# Patient Record
Sex: Female | Born: 1940
Health system: Southern US, Community
[De-identification: ages and names within clinical notes are randomized; demographics above are authoritative.]

## PROBLEM LIST (undated history)

## (undated) ENCOUNTER — Ambulatory Visit: Payer: Medicare PPO

## (undated) DIAGNOSIS — E66811 Obesity, class 1: Secondary | ICD-10-CM

## (undated) DIAGNOSIS — Z860100 Personal history of colon polyps, unspecified: Secondary | ICD-10-CM

## (undated) DIAGNOSIS — T4145XA Adverse effect of unspecified anesthetic, initial encounter: Secondary | ICD-10-CM

## (undated) DIAGNOSIS — I739 Peripheral vascular disease, unspecified: Secondary | ICD-10-CM

## (undated) DIAGNOSIS — R112 Nausea with vomiting, unspecified: Secondary | ICD-10-CM

## (undated) DIAGNOSIS — J189 Pneumonia, unspecified organism: Secondary | ICD-10-CM

## (undated) DIAGNOSIS — M1712 Unilateral primary osteoarthritis, left knee: Secondary | ICD-10-CM

## (undated) DIAGNOSIS — K219 Gastro-esophageal reflux disease without esophagitis: Secondary | ICD-10-CM

## (undated) DIAGNOSIS — K5900 Constipation, unspecified: Secondary | ICD-10-CM

## (undated) DIAGNOSIS — M2550 Pain in unspecified joint: Secondary | ICD-10-CM

## (undated) DIAGNOSIS — M549 Dorsalgia, unspecified: Secondary | ICD-10-CM

## (undated) DIAGNOSIS — E785 Hyperlipidemia, unspecified: Secondary | ICD-10-CM

## (undated) DIAGNOSIS — T7840XA Allergy, unspecified, initial encounter: Secondary | ICD-10-CM

## (undated) DIAGNOSIS — Z9889 Other specified postprocedural states: Secondary | ICD-10-CM

## (undated) DIAGNOSIS — I509 Heart failure, unspecified: Secondary | ICD-10-CM

## (undated) DIAGNOSIS — C801 Malignant (primary) neoplasm, unspecified: Secondary | ICD-10-CM

## (undated) DIAGNOSIS — G5600 Carpal tunnel syndrome, unspecified upper limb: Secondary | ICD-10-CM

## (undated) DIAGNOSIS — E039 Hypothyroidism, unspecified: Secondary | ICD-10-CM

## (undated) DIAGNOSIS — I10 Essential (primary) hypertension: Secondary | ICD-10-CM

## (undated) DIAGNOSIS — R42 Dizziness and giddiness: Secondary | ICD-10-CM

## (undated) DIAGNOSIS — H269 Unspecified cataract: Secondary | ICD-10-CM

## (undated) DIAGNOSIS — H04123 Dry eye syndrome of bilateral lacrimal glands: Secondary | ICD-10-CM

## (undated) DIAGNOSIS — M1711 Unilateral primary osteoarthritis, right knee: Secondary | ICD-10-CM

## (undated) DIAGNOSIS — E2839 Other primary ovarian failure: Secondary | ICD-10-CM

## (undated) DIAGNOSIS — R350 Frequency of micturition: Secondary | ICD-10-CM

## (undated) DIAGNOSIS — E669 Obesity, unspecified: Secondary | ICD-10-CM

## (undated) DIAGNOSIS — Z5189 Encounter for other specified aftercare: Secondary | ICD-10-CM

## (undated) DIAGNOSIS — D649 Anemia, unspecified: Secondary | ICD-10-CM

## (undated) DIAGNOSIS — T8859XA Other complications of anesthesia, initial encounter: Secondary | ICD-10-CM

## (undated) DIAGNOSIS — J45909 Unspecified asthma, uncomplicated: Secondary | ICD-10-CM

## (undated) DIAGNOSIS — R3915 Urgency of urination: Secondary | ICD-10-CM

## (undated) HISTORY — PX: JOINT REPLACEMENT: SHX530

## (undated) HISTORY — DX: Obesity, unspecified: E66.9

## (undated) HISTORY — DX: Essential (primary) hypertension: I10

## (undated) HISTORY — DX: Other primary ovarian failure: E28.39

## (undated) HISTORY — DX: Anemia, unspecified: D64.9

## (undated) HISTORY — DX: Peripheral vascular disease, unspecified: I73.9

## (undated) HISTORY — PX: ABDOMINAL HYSTERECTOMY: SHX81

## (undated) HISTORY — DX: Allergy, unspecified, initial encounter: T78.40XA

## (undated) HISTORY — PX: BACK SURGERY: SHX140

## (undated) HISTORY — PX: TUBAL LIGATION: SHX77

## (undated) HISTORY — DX: Obesity, class 1: E66.811

## (undated) HISTORY — PX: APPENDECTOMY: SHX54

## (undated) HISTORY — PX: SPINE SURGERY: SHX786

## (undated) HISTORY — DX: Gastro-esophageal reflux disease without esophagitis: K21.9

## (undated) HISTORY — DX: Carpal tunnel syndrome, unspecified upper limb: G56.00

## (undated) HISTORY — DX: Hypothyroidism, unspecified: E03.9

---

## 1981-07-20 HISTORY — PX: NECK SURGERY: SHX720

## 2001-08-30 ENCOUNTER — Ambulatory Visit (HOSPITAL_COMMUNITY): Admission: RE | Admit: 2001-08-30 | Discharge: 2001-08-30 | Payer: Self-pay | Admitting: Neurology

## 2001-08-30 ENCOUNTER — Encounter: Payer: Self-pay | Admitting: Neurology

## 2002-07-04 ENCOUNTER — Ambulatory Visit (HOSPITAL_COMMUNITY): Admission: RE | Admit: 2002-07-04 | Discharge: 2002-07-04 | Payer: Self-pay | Admitting: Internal Medicine

## 2002-07-04 ENCOUNTER — Encounter: Payer: Self-pay | Admitting: Internal Medicine

## 2002-09-14 ENCOUNTER — Encounter (HOSPITAL_COMMUNITY): Admission: RE | Admit: 2002-09-14 | Discharge: 2002-10-14 | Payer: Self-pay | Admitting: Rheumatology

## 2002-12-07 ENCOUNTER — Encounter: Payer: Self-pay | Admitting: Obstetrics and Gynecology

## 2002-12-07 ENCOUNTER — Ambulatory Visit (HOSPITAL_COMMUNITY): Admission: RE | Admit: 2002-12-07 | Discharge: 2002-12-07 | Payer: Self-pay | Admitting: Obstetrics and Gynecology

## 2003-06-07 ENCOUNTER — Ambulatory Visit (HOSPITAL_COMMUNITY): Admission: RE | Admit: 2003-06-07 | Discharge: 2003-06-07 | Payer: Self-pay | Admitting: Family Medicine

## 2003-08-07 ENCOUNTER — Ambulatory Visit (HOSPITAL_COMMUNITY): Admission: RE | Admit: 2003-08-07 | Discharge: 2003-08-07 | Payer: Self-pay | Admitting: Internal Medicine

## 2003-11-21 ENCOUNTER — Other Ambulatory Visit: Admission: RE | Admit: 2003-11-21 | Discharge: 2003-11-21 | Payer: Self-pay | Admitting: Dermatology

## 2004-01-03 ENCOUNTER — Ambulatory Visit (HOSPITAL_COMMUNITY): Admission: RE | Admit: 2004-01-03 | Discharge: 2004-01-03 | Payer: Self-pay | Admitting: Obstetrics and Gynecology

## 2004-05-02 ENCOUNTER — Ambulatory Visit (HOSPITAL_COMMUNITY): Admission: RE | Admit: 2004-05-02 | Discharge: 2004-05-02 | Payer: Self-pay | Admitting: Family Medicine

## 2004-09-09 ENCOUNTER — Ambulatory Visit (HOSPITAL_COMMUNITY): Admission: RE | Admit: 2004-09-09 | Discharge: 2004-09-09 | Payer: Self-pay | Admitting: Internal Medicine

## 2004-09-09 ENCOUNTER — Ambulatory Visit: Payer: Self-pay | Admitting: Internal Medicine

## 2004-09-09 HISTORY — PX: COLONOSCOPY: SHX174

## 2005-07-27 ENCOUNTER — Ambulatory Visit (HOSPITAL_COMMUNITY): Admission: RE | Admit: 2005-07-27 | Discharge: 2005-07-27 | Payer: Self-pay | Admitting: Internal Medicine

## 2006-05-05 ENCOUNTER — Encounter (HOSPITAL_COMMUNITY): Admission: RE | Admit: 2006-05-05 | Discharge: 2006-06-04 | Payer: Self-pay | Admitting: Family Medicine

## 2006-05-07 ENCOUNTER — Ambulatory Visit (HOSPITAL_COMMUNITY): Admission: RE | Admit: 2006-05-07 | Discharge: 2006-05-07 | Payer: Self-pay | Admitting: Family Medicine

## 2006-08-12 ENCOUNTER — Ambulatory Visit (HOSPITAL_COMMUNITY): Admission: RE | Admit: 2006-08-12 | Discharge: 2006-08-12 | Payer: Self-pay | Admitting: Family Medicine

## 2006-11-05 ENCOUNTER — Ambulatory Visit (HOSPITAL_COMMUNITY): Admission: RE | Admit: 2006-11-05 | Discharge: 2006-11-05 | Payer: Self-pay | Admitting: Orthopaedic Surgery

## 2007-08-26 ENCOUNTER — Ambulatory Visit (HOSPITAL_COMMUNITY): Admission: RE | Admit: 2007-08-26 | Discharge: 2007-08-26 | Payer: Self-pay | Admitting: Family Medicine

## 2007-10-19 HISTORY — PX: RECTOCELE REPAIR: SHX761

## 2007-11-14 ENCOUNTER — Ambulatory Visit (HOSPITAL_COMMUNITY): Admission: RE | Admit: 2007-11-14 | Discharge: 2007-11-15 | Payer: Self-pay | Admitting: Obstetrics and Gynecology

## 2007-11-14 ENCOUNTER — Encounter: Payer: Self-pay | Admitting: Obstetrics and Gynecology

## 2008-09-03 ENCOUNTER — Ambulatory Visit (HOSPITAL_COMMUNITY): Admission: RE | Admit: 2008-09-03 | Discharge: 2008-09-03 | Payer: Self-pay | Admitting: Family Medicine

## 2009-09-24 ENCOUNTER — Ambulatory Visit (HOSPITAL_COMMUNITY): Admission: RE | Admit: 2009-09-24 | Discharge: 2009-09-24 | Payer: Self-pay | Admitting: Family Medicine

## 2010-08-10 ENCOUNTER — Encounter: Payer: Self-pay | Admitting: Family Medicine

## 2010-10-24 ENCOUNTER — Other Ambulatory Visit (HOSPITAL_COMMUNITY): Payer: Self-pay | Admitting: Internal Medicine

## 2010-10-24 DIAGNOSIS — Z139 Encounter for screening, unspecified: Secondary | ICD-10-CM

## 2010-10-30 ENCOUNTER — Ambulatory Visit (HOSPITAL_COMMUNITY)
Admission: RE | Admit: 2010-10-30 | Discharge: 2010-10-30 | Disposition: A | Discharge status: Home / Self Care | Payer: Medicare Other | Source: Ambulatory Visit | Attending: Internal Medicine | Admitting: Internal Medicine

## 2010-10-30 DIAGNOSIS — Z139 Encounter for screening, unspecified: Secondary | ICD-10-CM

## 2010-10-30 DIAGNOSIS — Z1231 Encounter for screening mammogram for malignant neoplasm of breast: Secondary | ICD-10-CM | POA: Insufficient documentation

## 2010-12-02 NOTE — Op Note (Signed)
NAME:  [name], [name]                 ACCOUNT NO.:  [id number]   MEDICAL RECORD NO.:  [id number]          PATIENT TYPE:  OIB   LOCATION:  A327                          FACILITY:  [facility]   PHYSICIAN:  Tilda Burrow, M.D. DATE OF BIRTH:  October 25, 1940   DATE OF PROCEDURE:  11/14/2007  DATE OF DISCHARGE:                               OPERATIVE REPORT   PREOPERATIVE DIAGNOSIS:  Rectocele.   POSTOPERATIVE DIAGNOSIS:  Rectocele.   PROCEDURE:  Posterior vaginal repair.   SURGEON:  Tilda Burrow, MD   ASSISTANT:  Marlana Salvage, CRNA.   ANESTHESIA:  General.   COMPLICATIONS:  None.   FINDINGS:  Marked posterior laxity with extremely mobile rectal tissues,  midline perineal defect.   DETAILS OF PROCEDURE:  The patient was taken to the operating room and  prepped and draped for vaginal procedure.  A moistened vaginal tape was  placed in the rectum for orientation.  The very lax mobile perineal  tissues of the posterior vaginal wall were grasped with Allis clamp at 4  and 8 o'clock with Allis clamps at the level of the hymen remnants on  the posterior fourchette, and a third Allis clamp was placed  approximately 6 cm of the posterior vaginal wall, halfway to the vaginal  apex.  Vaginal apex support was adequate.  The posterior vaginal mucosa  was opened at the level of the hymen remnants, elevated, and easily  elevated off the underlying connective tissues.  It was split in the  midline up to the upper Allis clamp and then easily dissected laterally  and cephalad mobilizing loose tissues failed to represent either a very  large rectocele or enterocele.  At this time, a double-gloved index  finger preplaced in the rectum and confirming that this was all rectal  tissues, and there was no identifiable enterocele.  The procedure then  continued with while I maintained the right index finger in the rectum.  Allis clamps were used to grasp pararectal tissue on either side that  could be pulled into  the midline and elevated cephalad producing a very  nice rectal shelf.  A series of interrupted 0 Vicryl sutures were then  used to pull the two-sided midline defect together producing a nice  layer of tissue support with a total of 5 sutures placed.  Then, the  hand was removed, gloves changed, and sutures tied down.  Perineal body  was built up with an additional 3 sutures, and then the vaginal mucosa  trimmed.  The remaining mucosal edges were reapproximated using 2-0  chromic sutures, and then vaginal packing was placed in the vagina,  having been soaked in Betadine prior to placement.  The rectal gauze was  removed.  A Foley catheter had been placed in the bladder emptying clear  urine.  Sponge and needle counts were correct throughout the procedure.  The patient tolerated the procedure well and went to recovery room in  good condition.      Tilda Burrow, M.D.  Electronically Signed     JVF/MEDQ  D:  11/14/2007  T:  11/15/2007  Job:  [id number]

## 2010-12-05 NOTE — Consult Note (Signed)
NAME:  [name], [name]                           ACCOUNT NO.:  [id number]   MEDICAL RECORD NO.:  [id number]                   PATIENT TYPE:  REC   LOCATION:  SPCL                                 FACILITY:  [facility]   PHYSICIAN:  Aundra Dubin, M.D.            DATE OF BIRTH:  07-15-1941   DATE OF CONSULTATION:  09/14/2002  DATE OF DISCHARGE:                                   CONSULTATION   CHIEF COMPLAINT:  Referred.   Dear [name],   Thank you for this consultation.   HISTORY:  This patient is a 70 year old [name] female with hypothyroidism and  a history of a positive ANA for about 1 year.  The titer is 1:320 without a  reported pattern.  Other tests on 07/04/2002 showed an albumin 4.6, calcium  9.3, AST 21, glucose 90, creatinine 0.9, WBC 8.7, Hgb 15.2, TLT 286.   In terms of arthralgias, she stated I ache all over.  She is quite stiff  in the mornings, particularly all through the back and up into the shoulder  areas.  There is possible jelling.  There has been no swollen joints.  She  did have an injury or swelling to the let knee about 1 year ago when Dr.  Renae Fickle injected this with good results.  She has been on Celebrex since this  time which she takes 2-3 tablets per week.  Her thumbs ache some, but she  does not feel that they have been swollen.   REVIEW OF SYSTEMS:  On further review of systems she denies fevers or weight  loss.  She occasionally has some blisters to her fingers.  She has some  redness to her face which can worsen around heat or being in the sun.  There  has been no oral ulcers, alopecia, pleurisy or Raynaud.  She occasionally  has headaches which will last a day and Tylenol helps.  This occurs only 3-4  times a year.  Her energy level was somewhat down.  She says that she  generally sleeps well, but has a few nights of nonrestorative sleep.  She  denies diarrhea, constipation, blood or mucus.  She has been more short of  breath or winded with slight  activity recently.  The back pain, described  above, is nonradiating.   PAST MEDICAL/SURGICAL HISTORY:  1. Hyperthyroidism.  2. Appendectomy in 1980.  3. Cervical spine discectomy in 1983.  4. Hysterectomy and oophorectomy in 1990.   MEDICATIONS:  1. Synthroid 0.5 mg daily.  2. Estrogen 0.625 mg daily.  3. Celebrex 200 mg p.r.n.   ALLERGIES:  Drug Intolerance:  Penicillin.   FAMILY HISTORY:  Her father died at age 59 with emphysema and pneumonia.  She has a brother with emphysema and another brother who has died from lung  cancer.  Her mother died at age 41 and she had Alzheimer's.   SOCIAL HISTORY:  She is  married and has two sons.  One son still lives at  home. 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  She is a [place] native.  She never smoked and does not drink alcohol.   PHYSICAL EXAMINATION:  VITAL SIGNS:  Weight 170 pounds.  Height 5 feet 3  inches.  Blood pressure 146/90, respirations 16.  GENERAL:  She is mild-to-moderately overweight and is in no distress.  SKIN:  There is no malar rash or blisters of the fingers.  There is no nail  fold dilatation or scleral derma changes.  HEENT:  Clear sclerae.  PERL/EOMI.  Mouth: Dentures, no obvious ulcers or  petechiae.  NECK:  Prominent submandibular glands, palpable mildly enlarged thyroid,  nontender  LUNGS:  Clear.  HEART:  Regular with no murmurs.  ABDOMEN:  Negative.  HSM nontender.  MUSCULOSKELETAL:  The small joints of the fingers show no swelling and are  nontender.  There is slight squaring to the first CMCs but these areas are  nontender.  Wrists, elbows and shoulders: Good range of motion, but slightly  limited and nontender.  Trigger points at the elbows, shoulders, neck,  occiput, anterior chest, upper paraspinous muscles and across the low back:  They were all nontender. Hips:  Good range of motion.  The knees have slight  hypertrophy, were cool, no effusions, and were nontender  with flexion at 135  degrees bilaterally.  There was no joint line tenderness.  The ankles and  feet showed no edema, and were nontender.  NEUROLOGIC:  Strength is 5/5, DTRs are 1+ throughout and negative SLR.   ASSESSMENT AND PLAN:  1. Positive ANA.  I do not believe that she has lupus.  I suspect that the     positive ANA is related to her thyroid condition.  Particularly with     Hashimoto's thyroiditis an ANA is more frequently positive.  Especially     now, being almost 70 years of age, the likelihood of this being lupus is     quite remote.  2. Polyarthralgia.  She has some mild degenerative changes.  She tells me     from the x-rays with Dr. Renae Fickle, the exam overall was quite good. I found     that although her strength is 5/5 there is some general deconditioning     and atrophy to the thigh muscles.  I have spent considerable time     encouraging her to start an exercise program be it walking or stationary     bike.  She could certainly do a combination of these.  She will start     with a small amount at 10 minutes 3-4 times a week and add 2-3 minutes of     exercise per week.  It will take her approximately 4-6 weeks to achieve     exercise in 30-40 minutes three times a week.  I believe that this will     help her back and knees particularly.  Concerning medicines,  I have     discussed that she could use Tylenol 1 gm b.i.d. to t.i.d. p.r.n. in     addition to using the Celebrex as above.  3. Hypothyroidism.  4. Hysterectomy.  5. History of cervical spine surgery.   [name], as above the ANA is nonspecific and I do not feel that she has lupus.  It may be related to her hypothyroid condition. She has some generalized  ache and pain. She did state  that she ached all over, but her sleep pattern  seems  to be reasonable, and her trigger points were negative.  I hope that she will begin exercising, as I think, that this will help her energy level and  overall well being.  If these  problems continue, I will be glad to  reevaluate here.  She will return on a p.r.n. basis.                                               Aundra Dubin, M.D.    WWT/MEDQ  D:  09/14/2002  T:  09/14/2002  Job:  [id number]   cc:   Madelin Rear. Sherwood Gambler, M.D.  [address]  [place]  [place] [address]  Fax: [phone]

## 2010-12-05 NOTE — Op Note (Signed)
NAME:  [name], [name]                 ACCOUNT NO.:  [id number]   MEDICAL RECORD NO.:  [id number]          PATIENT TYPE:  AMB   LOCATION:  DAY                           FACILITY:  [facility]   PHYSICIAN:  R. Roetta Sessions, M.D. DATE OF BIRTH:  January 17, 1941   DATE OF PROCEDURE:  09/09/2004  DATE OF DISCHARGE:                                 OPERATIVE REPORT   PROCEDURE:  Screening colonoscopy.   INDICATION FOR PROCEDURE:  The patient is a 70 year old lady sent over  courtesy of [employer]. Sherwood Gambler, M.D., for a screening colonoscopy.  She is  devoid of any lower GI tract symptoms.  No family history of colorectal  neoplasia.  She has never had a colonoscopy; however, she has had a  significant some 10 years ago without significant findings.  Colonoscopy is  now being done as a standard screening maneuver.  This approach has been  discussed with the patient at length, the potential risks, benefits, and  alternatives have been reviewed, questions answered.  She is agreeable.  Please see documentation in the medical record.   PROCEDURE NOTE:  O2 saturation, blood pressure, pulse, and respiration were  monitored throughout the entire procedure.   CONSCIOUS SEDATION:  IV Demerol and Versed in incremental doses, Zofran 4 mg  IV prior to the procedure to prophylaxe against nausea.   INSTRUMENT USED:  Olympus video colonoscope.   FINDINGS:  Digital rectal exam revealed no abnormalities.  Endoscopic  findings:  The prep was good.   Rectum:  Examination of the rectal mucosa including a retroflexed view of  the anal verge revealed only internal hemorrhoids.   Colon:  The colonic mucosa was surveyed from the rectosigmoid junction  through the left, transverse and right colon to the area of the area of the  appendiceal remnant, ileocecal valve and cecum.  These structures were well-  seen and photographed for the record.  From this level the scope was slowly  withdrawn and all previously-mentioned mucosal  surfaces were again seen.  The patient's colon was quite redundant and tortuous, requiring a number of  maneuvers, including external abdominal pressure and change of the patient's  position to reach the cecum.   The colonic mucosa appeared entirely normal.  The patient tolerated the  procedure well, was reacted in endoscopy.   IMPRESSION:  1.  Normal rectum.  2.  Normal colon.   RECOMMENDATIONS:  A repeat screening colonoscopy 10 years.      RMR/MEDQ  D:  09/09/2004  T:  09/09/2004  Job:  [id number]   cc:   Madelin Rear. Sherwood Gambler, MD  [address]  [place]  [place] [address]  Fax: [phone]

## 2011-04-14 LAB — BASIC METABOLIC PANEL
BUN: 19
CO2: 27
Calcium: 9
Creatinine, Ser: 0.82
GFR calc Af Amer: >60
Glucose, Bld: 85

## 2011-04-14 LAB — CBC
MCHC: 34.9
Platelets: 245
RBC: 4.73
RDW: 14.6

## 2011-11-11 ENCOUNTER — Other Ambulatory Visit (HOSPITAL_COMMUNITY): Payer: Self-pay | Admitting: Internal Medicine

## 2011-11-11 DIAGNOSIS — Z139 Encounter for screening, unspecified: Secondary | ICD-10-CM

## 2011-11-12 ENCOUNTER — Ambulatory Visit (HOSPITAL_COMMUNITY)
Admission: RE | Admit: 2011-11-12 | Discharge: 2011-11-12 | Disposition: A | Discharge status: Home / Self Care | Payer: Medicare Other | Source: Ambulatory Visit | Attending: Internal Medicine | Admitting: Internal Medicine

## 2011-11-12 DIAGNOSIS — Z139 Encounter for screening, unspecified: Secondary | ICD-10-CM

## 2011-11-12 DIAGNOSIS — Z1231 Encounter for screening mammogram for malignant neoplasm of breast: Secondary | ICD-10-CM | POA: Insufficient documentation

## 2012-11-17 ENCOUNTER — Encounter: Payer: Self-pay | Admitting: Obstetrics and Gynecology

## 2012-11-17 ENCOUNTER — Ambulatory Visit (INDEPENDENT_AMBULATORY_CARE_PROVIDER_SITE_OTHER): Payer: Medicare Other | Admitting: Obstetrics and Gynecology

## 2012-11-17 VITALS — BP 140/80 | Wt 177.0 lb

## 2012-11-17 DIAGNOSIS — Z1212 Encounter for screening for malignant neoplasm of rectum: Secondary | ICD-10-CM

## 2012-11-17 DIAGNOSIS — R300 Dysuria: Secondary | ICD-10-CM

## 2012-11-17 DIAGNOSIS — N951 Menopausal and female climacteric states: Secondary | ICD-10-CM

## 2012-11-17 LAB — POCT URINALYSIS DIPSTICK
Nitrite, UA: NEGATIVE
Protein, UA: NEGATIVE

## 2012-11-17 LAB — HEMOCCULT GUIAC POC 1CARD (OFFICE): Fecal Occult Blood, POC: NEGATIVE

## 2012-11-17 MED ORDER — ESTRADIOL 0.1 MG/GM VA CREA: 2.0000 g | TOPICAL_CREAM | Freq: Every day | VAGINAL | Status: DC

## 2012-11-17 NOTE — Progress Notes (Signed)
  Assessment:  postmenopausal vasomotor sx with vaginal atrophy   Plan:  return annually or prn Trial Estrace VC hs 0.5 gm pv x 2 wk, then biweekly  Subjective:  [name] is a 72 y.o. female, No obstetric history on file., who presents for vasomotor sx since d/c of hrt 12/13 Vulvar irritation and burning. S/p tahbso  The following portions of the patient's history were reviewed and updated as appropriate: allergies, current medications, past medical & surgical history, & past family history.  Past Medical History  Diagnosis Date  . Hypertension    Horseshoe kidney  There is no significant family history of breast or ovarian cancer.    Review of Systems Pertinent items are noted in HPI. Breast:Negative for breast lump,nipple discharge or nipple retraction Gastrointestinal: Negative for abdominal pain, change in bowel habits or rectal bleeding GU: Negative for dysuria, frequency, urgency or incontinence.   GYN: No LMP recorded. Patient has had a hysterectomy.   Objective:  BP 140/80  Wt 177 lb (80.287 kg)    BMI: There is no height on file to calculate BMI.  General Appearance: Alert, appropriate appearance for age. No acute distress HEENT: Grossly normal Neck / Thyroid: Supple, no masses, nodes or enlargement Cardiovascular: Regular rate and rhythm. S1, S2, no murmur Lungs: Clear to auscultation bilaterally Back: No CVA tenderness Gastrointestinal: Soft, non-tender, no masses or organomegaly Pelvic Exam: External genitalia: normal general appearance and atrophic Vaginal: atrophic mucosa Rectovaginal: normal rectal, no masses  Christin Bach MD

## 2012-12-26 ENCOUNTER — Other Ambulatory Visit (HOSPITAL_COMMUNITY): Payer: Self-pay | Admitting: Family Medicine

## 2012-12-26 DIAGNOSIS — Z139 Encounter for screening, unspecified: Secondary | ICD-10-CM

## 2012-12-30 ENCOUNTER — Ambulatory Visit (HOSPITAL_COMMUNITY)
Admission: RE | Admit: 2012-12-30 | Discharge: 2012-12-30 | Disposition: A | Discharge status: Home / Self Care | Payer: Medicare Other | Source: Ambulatory Visit | Attending: Family Medicine | Admitting: Family Medicine

## 2012-12-30 DIAGNOSIS — Z1231 Encounter for screening mammogram for malignant neoplasm of breast: Secondary | ICD-10-CM | POA: Insufficient documentation

## 2012-12-30 DIAGNOSIS — Z139 Encounter for screening, unspecified: Secondary | ICD-10-CM

## 2013-01-18 ENCOUNTER — Ambulatory Visit (INDEPENDENT_AMBULATORY_CARE_PROVIDER_SITE_OTHER): Payer: Medicare Other | Admitting: Obstetrics and Gynecology

## 2013-01-18 ENCOUNTER — Encounter: Payer: Self-pay | Admitting: Obstetrics and Gynecology

## 2013-01-18 VITALS — BP 122/80 | Ht 63.0 in | Wt 173.6 lb

## 2013-01-18 DIAGNOSIS — N952 Postmenopausal atrophic vaginitis: Secondary | ICD-10-CM

## 2013-01-18 DIAGNOSIS — N951 Menopausal and female climacteric states: Secondary | ICD-10-CM

## 2013-01-18 MED ORDER — ESTRACE 0.1 MG/GM VA CREA: 1.5000 g | TOPICAL_CREAM | Freq: Every day | VAGINAL | Status: DC

## 2013-01-18 NOTE — Patient Instructions (Signed)
Continue estrace as ordered, 1 year refil ordered

## 2013-01-18 NOTE — Progress Notes (Signed)
Patient ID: [name], female   DOB: 09/24/1940, 72 y.o.   MRN: [id number] Pt here today for follow up on medication. Pt was given Estrace vaginal cream. Pt states things are much much better.  No vasomotor sx and no vulvar discomfort.   Assessment:  Hormone replacement therapy vasomotor sx improved, vulvar irritation improved   Plan:  Continue Premarin at 1.5 gm/night  Subjective:  [name] is a 72 y.o. female, thrilled for  changes.  Review of Systems Pertinent items are noted in HPI. Breast:Negative for breast lump,nipple discharge or nipple retraction Gastrointestinal: Negative for abdominal pain, change in bowel habits or rectal bleeding GU: Negative for dysuria, frequency, urgency or incontinence.   GYN: No LMP recorded. Patient has had a hysterectomy.   Objective:  BP 122/80  Ht 5\' 3"  (1.6 m)  Wt 173 lb 9.6 oz (78.744 kg)  BMI 30.76 kg/m2    BMI: Body mass index is 30.76 kg/(m^2).  General Appearance: Alert, appropriate appearance for age. No acute distress Pelvic deferred HEENT: Grossly normal Tilda Burrow MD

## 2013-10-04 ENCOUNTER — Ambulatory Visit: Payer: Medicare Other | Admitting: Gastroenterology

## 2013-10-31 ENCOUNTER — Encounter (INDEPENDENT_AMBULATORY_CARE_PROVIDER_SITE_OTHER): Payer: Self-pay

## 2013-10-31 ENCOUNTER — Encounter (HOSPITAL_COMMUNITY): Payer: Self-pay | Admitting: Pharmacy Technician

## 2013-10-31 ENCOUNTER — Encounter: Payer: Self-pay | Admitting: Gastroenterology

## 2013-10-31 ENCOUNTER — Ambulatory Visit (INDEPENDENT_AMBULATORY_CARE_PROVIDER_SITE_OTHER): Payer: PRIVATE HEALTH INSURANCE | Admitting: Gastroenterology

## 2013-10-31 ENCOUNTER — Other Ambulatory Visit: Payer: Self-pay | Admitting: Internal Medicine

## 2013-10-31 VITALS — BP 137/82 | HR 60 | Temp 98.4°F | Ht 63.0 in | Wt 171.8 lb

## 2013-10-31 DIAGNOSIS — R194 Change in bowel habit: Secondary | ICD-10-CM

## 2013-10-31 DIAGNOSIS — R198 Other specified symptoms and signs involving the digestive system and abdomen: Secondary | ICD-10-CM

## 2013-10-31 DIAGNOSIS — K625 Hemorrhage of anus and rectum: Secondary | ICD-10-CM

## 2013-10-31 DIAGNOSIS — Z800 Family history of malignant neoplasm of digestive organs: Secondary | ICD-10-CM

## 2013-10-31 MED ORDER — PEG 3350-KCL-NA BICARB-NACL 420 G PO SOLR: 4000.0000 mL | ORAL | Status: DC

## 2013-10-31 NOTE — Assessment & Plan Note (Signed)
73 year old lady with history of change in bowel habits in the past couple months. She had some bright red blood per rectum several weeks ago but this has since stopped. Complains of worsening constipation although she is managing fairly well with dietary changes. Has some vague lower abdominal discomfort possibly related to constipation, adhesions. Sister had colon cancer at an advanced age, 81. Patient's last colonoscopy 2006. Recommend updating colonoscopy given bowel habit change in blood in the stool.  I have discussed the risks, alternatives, benefits with regards to but not limited to the risk of reaction to medication, bleeding, infection, perforation and the patient is agreeable to proceed. Written consent to be obtained.  Patient complains of post-sedation N/V after her last colonoscopy. Patient not interested in medication for constipation. Consider further workup of abdominal pain based on colonoscopy findings.

## 2013-10-31 NOTE — Progress Notes (Signed)
Primary Care Physician:  Leonides Grills, MD  Primary Gastroenterologist:  Garfield Cornea, MD   Chief Complaint  Patient presents with  . Rectal Bleeding  . Constipation    HPI:  [name] is a 73 y.o. female here for further evaluation of change in bowel habits, abdominal discomfort, rectal bleeding.  February saw some blood in stool and mucous. Lower abdominal discomfort and LLQ, moves around, ?gas. Feels like not emptying colon. Eating Activia daily and helps a lot but if stops then gets constipation. Chronic constipation. Right now just a small BM day. Stools are softer now. A lot different from normal. No n/v. Feels significant fatigue. Last labs 04/2013, "normal". Started Lipitor in 12/2012. C/o lower extremity pain since on Lipitor. Weight up 17 pounds. Had stopped going to [employer]. No heartburn. No dysphagia.    Current Outpatient Prescriptions  Medication Sig Dispense Refill  . aspirin 81 MG tablet Take 81 mg by mouth daily.      Marland Kitchen atorvastatin (LIPITOR) 10 MG tablet Take 10 mg by mouth daily.      Marland Kitchen ESTRACE VAGINAL 0.1 MG/GM vaginal cream Place 4.69 Applicatorfuls vaginally daily.  42.5 g  12  . levothyroxine (SYNTHROID, LEVOTHROID) 75 MCG tablet Take 75 mcg by mouth daily before breakfast.      . lisinopril (PRINIVIL,ZESTRIL) 10 MG tablet Take 10 mg by mouth daily.       No current facility-administered medications for this visit.    Allergies as of 10/31/2013 - Review Complete 10/31/2013  Allergen Reaction Noted  . Penicillins Rash 11/17/2012    Past Medical History  Diagnosis Date  . Hypertension   . Helicobacter pylori (H. pylori) infection 06/2011    treated  . Hypothyroidism     Past Surgical History  Procedure Laterality Date  . Abdominal hysterectomy  1990    complete  . Rectocele repair  10/2007  . Colonoscopy  09/09/2004    GEX:BMWUXL rectum and colon  . Appendectomy  1980  . Neck surgery  1983    Family History  Problem Relation Age  of Onset  . Colon cancer Sister     age 38s  . Bladder Cancer Brother   . Lung cancer Brother   . Myasthenia gravis Sister     History   Social History  . Marital Status: Widowed    Spouse Name: N/A    Number of Children: N/A  . Years of Education: N/A   Occupational History  . Not on file.   Social History Main Topics  . Smoking status: Never Smoker   . Smokeless tobacco: Never Used  . Alcohol Use: No  . Drug Use: No  . Sexual Activity: No   Other Topics Concern  . Not on file   Social History Narrative  . No narrative on file      ROS:  General: Negative for anorexia, weight loss, fever, chills, fatigue, weakness. Eyes: Negative for vision changes.  ENT: Negative for hoarseness, difficulty swallowing , nasal congestion. CV: Negative for chest pain, angina, palpitations, dyspnea on exertion, peripheral edema.  Respiratory: Negative for dyspnea at rest, dyspnea on exertion, cough, sputum, wheezing.  GI: See history of present illness. GU:  Negative for dysuria, hematuria, urinary incontinence, urinary frequency, nocturnal urination.  MS: Negative for joint pain, low back pain.  Derm: Negative for rash or itching.  Neuro: Negative for weakness, abnormal sensation, seizure, frequent headaches, memory loss, confusion.  Psych: Negative for anxiety, depression, suicidal ideation, hallucinations.  Endo: Negative for unusual weight change.  Heme: Negative for bruising or bleeding. Allergy: Negative for rash or hives.    Physical Examination:  BP 137/82  Pulse 60  Temp(Src) 98.4 F (36.9 C) (Oral)  Ht 5\' 3"  (1.6 m)  Wt 171 lb 12.8 oz (77.928 kg)  BMI 30.44 kg/m2   General: Well-nourished, well-developed in no acute distress.  Head: Normocephalic, atraumatic.   Eyes: Conjunctiva pink, no icterus. Mouth: Oropharyngeal mucosa moist and pink , no lesions erythema or exudate. Neck: Supple without thyromegaly, masses, or lymphadenopathy.  Lungs: Clear to  auscultation bilaterally.  Heart: Regular rate and rhythm, no murmurs rubs or gallops.  Abdomen: Bowel sounds are normal, nontender, nondistended, no hepatosplenomegaly or masses, no abdominal bruits or    hernia , no rebound or guarding.   Rectal: Deferred Extremities: No lower extremity edema. No clubbing or deformities.  Neuro: Alert and oriented x 4 , grossly normal neurologically.  Skin: Warm and dry, no rash or jaundice.   Psych: Alert and cooperative, normal mood and affect.

## 2013-10-31 NOTE — Patient Instructions (Signed)
1. Colonoscopy with Dr. Gala Romney as scheduled. Please see separate instructions.

## 2013-10-31 NOTE — Progress Notes (Signed)
cc'd to pcp 

## 2013-11-08 ENCOUNTER — Encounter (HOSPITAL_COMMUNITY)
Admission: RE | Disposition: A | Discharge status: Home / Self Care | Payer: Self-pay | Source: Ambulatory Visit | Attending: Internal Medicine

## 2013-11-08 ENCOUNTER — Ambulatory Visit (HOSPITAL_COMMUNITY)
Admission: RE | Admit: 2013-11-08 | Discharge: 2013-11-08 | Disposition: A | Discharge status: Home / Self Care | Payer: Medicare Other | Source: Ambulatory Visit | Attending: Internal Medicine | Admitting: Internal Medicine

## 2013-11-08 ENCOUNTER — Encounter (HOSPITAL_COMMUNITY): Payer: Self-pay | Admitting: *Deleted

## 2013-11-08 DIAGNOSIS — D126 Benign neoplasm of colon, unspecified: Secondary | ICD-10-CM | POA: Insufficient documentation

## 2013-11-08 DIAGNOSIS — K649 Unspecified hemorrhoids: Secondary | ICD-10-CM

## 2013-11-08 DIAGNOSIS — I10 Essential (primary) hypertension: Secondary | ICD-10-CM | POA: Insufficient documentation

## 2013-11-08 DIAGNOSIS — Z7982 Long term (current) use of aspirin: Secondary | ICD-10-CM | POA: Insufficient documentation

## 2013-11-08 DIAGNOSIS — R194 Change in bowel habit: Secondary | ICD-10-CM

## 2013-11-08 DIAGNOSIS — Z86010 Personal history of colonic polyps: Secondary | ICD-10-CM

## 2013-11-08 DIAGNOSIS — K921 Melena: Secondary | ICD-10-CM | POA: Insufficient documentation

## 2013-11-08 DIAGNOSIS — K648 Other hemorrhoids: Secondary | ICD-10-CM | POA: Insufficient documentation

## 2013-11-08 DIAGNOSIS — K5730 Diverticulosis of large intestine without perforation or abscess without bleeding: Secondary | ICD-10-CM | POA: Insufficient documentation

## 2013-11-08 DIAGNOSIS — Z79899 Other long term (current) drug therapy: Secondary | ICD-10-CM | POA: Insufficient documentation

## 2013-11-08 DIAGNOSIS — K625 Hemorrhage of anus and rectum: Secondary | ICD-10-CM

## 2013-11-08 HISTORY — PX: COLONOSCOPY: SHX5424

## 2013-11-08 HISTORY — DX: Other specified postprocedural states: R11.2

## 2013-11-08 HISTORY — DX: Other complications of anesthesia, initial encounter: T88.59XA

## 2013-11-08 HISTORY — DX: Adverse effect of unspecified anesthetic, initial encounter: T41.45XA

## 2013-11-08 HISTORY — DX: Other specified postprocedural states: Z98.890

## 2013-11-08 SURGERY — COLONOSCOPY
Anesthesia: Moderate Sedation

## 2013-11-08 MED ORDER — MEPERIDINE HCL 100 MG/ML IJ SOLN
INTRAMUSCULAR | Status: AC
  Filled 2013-11-08: qty 2

## 2013-11-08 MED ORDER — ONDANSETRON HCL 4 MG/2ML IJ SOLN
INTRAMUSCULAR | Status: AC
  Filled 2013-11-08: qty 2

## 2013-11-08 MED ORDER — STERILE WATER FOR IRRIGATION IR SOLN
Status: DC | PRN
  Administered 2013-11-08: 11:00:00

## 2013-11-08 MED ORDER — MEPERIDINE HCL 100 MG/ML IJ SOLN
INTRAMUSCULAR | Status: DC | PRN
  Administered 2013-11-08: 50 mg via INTRAVENOUS
  Administered 2013-11-08: 25 mg via INTRAVENOUS

## 2013-11-08 MED ORDER — MIDAZOLAM HCL 5 MG/5ML IJ SOLN
INTRAMUSCULAR | Status: DC | PRN
  Administered 2013-11-08: 1 mg via INTRAVENOUS
  Administered 2013-11-08: 2 mg via INTRAVENOUS
  Administered 2013-11-08 (×3): 1 mg via INTRAVENOUS

## 2013-11-08 MED ORDER — MIDAZOLAM HCL 5 MG/5ML IJ SOLN
INTRAMUSCULAR | Status: AC
  Filled 2013-11-08: qty 10

## 2013-11-08 MED ORDER — SODIUM CHLORIDE 0.9 % IV SOLN
INTRAVENOUS | Status: DC
  Administered 2013-11-08: 1000 mL via INTRAVENOUS

## 2013-11-08 NOTE — Interval H&P Note (Signed)
History and Physical Interval Note:  11/08/2013 10:45 AM  [name]  has presented today for surgery, with the diagnosis of RECTAL BLEEDING  AND BOWEL HABIT CHANGES  The various methods of treatment have been discussed with the patient and family. After consideration of risks, benefits and other options for treatment, the patient has consented to  Procedure(s) with comments: COLONOSCOPY (N/A) - 10:45 as a surgical intervention .  The patient's history has been reviewed, patient examined, no change in status, stable for surgery.  I have reviewed the patient's chart and labs.  Questions were answered to the patient's satisfaction.   No changes aside from patient's constipation no longer being an issue taking Activia every day.  Abdominal pain also improved with effective management constipation. Colonoscopy today per plan.The risks, benefits, limitations, alternatives and imponderables have been reviewed with the patient. Questions have been answered. All parties are agreeable.   Cristopher Estimable Laithan Conchas

## 2013-11-08 NOTE — Discharge Instructions (Addendum)
°  Colonoscopy Discharge Instructions  Read the instructions outlined below and refer to this sheet in the next few weeks. These discharge instructions provide you with general information on caring for yourself after you leave the hospital. Your doctor may also give you specific instructions. While your treatment has been planned according to the most current medical practices available, unavoidable complications occasionally occur. If you have any problems or questions after discharge, call Dr. Gala Romney at [phone]. ACTIVITY  You may resume your regular activity, but move at a slower pace for the next 24 hours.   Take frequent rest periods for the next 24 hours.   Walking will help get rid of the air and reduce the bloated feeling in your belly (abdomen).   No driving for 24 hours (because of the medicine (anesthesia) used during the test).    Do not sign any important legal documents or operate any machinery for 24 hours (because of the anesthesia used during the test).  NUTRITION  Drink plenty of fluids.   You may resume your normal diet as instructed by your doctor.   Begin with a light meal and progress to your normal diet. Heavy or fried foods are harder to digest and may make you feel sick to your stomach (nauseated).   Avoid alcoholic beverages for 24 hours or as instructed.  MEDICATIONS  You may resume your normal medications unless your doctor tells you otherwise.  WHAT YOU CAN EXPECT TODAY  Some feelings of bloating in the abdomen.   Passage of more gas than usual.   Spotting of blood in your stool or on the toilet paper.  IF YOU HAD POLYPS REMOVED DURING THE COLONOSCOPY:  No aspirin products for 7 days or as instructed.   No alcohol for 7 days or as instructed.   Eat a soft diet for the next 24 hours.  FINDING OUT THE RESULTS OF YOUR TEST Not all test results are available during your visit. If your test results are not back during the visit, make an appointment  with your caregiver to find out the results. Do not assume everything is normal if you have not heard from your caregiver or the medical facility. It is important for you to follow up on all of your test results.  SEEK IMMEDIATE MEDICAL ATTENTION IF:  You have more than a spotting of blood in your stool.   Your belly is swollen (abdominal distention).   You are nauseated or vomiting.   You have a temperature over 101.   You have abdominal pain or discomfort that is severe or gets worse throughout the day.    Constipation, hemorrhoid and polyp information provided  Continue yogurt supplement daily  Ten-day course of Anusol suppositories one parenchyma twice daily  If rectal bleeding continues, we can band your hemorrhoids in the office  Further recommendations to follow pending review of pathology report

## 2013-11-08 NOTE — H&P (View-Only) (Signed)
Primary Care Physician:  Leonides Grills, MD  Primary Gastroenterologist:  Garfield Cornea, MD   Chief Complaint  Patient presents with  . Rectal Bleeding  . Constipation    HPI:  [name] is a 73 y.o. female here for further evaluation of change in bowel habits, abdominal discomfort, rectal bleeding.  February saw some blood in stool and mucous. Lower abdominal discomfort and LLQ, moves around, ?gas. Feels like not emptying colon. Eating Activia daily and helps a lot but if stops then gets constipation. Chronic constipation. Right now just a small BM day. Stools are softer now. A lot different from normal. No n/v. Feels significant fatigue. Last labs 04/2013, "normal". Started Lipitor in 12/2012. C/o lower extremity pain since on Lipitor. Weight up 17 pounds. Had stopped going to [employer]. No heartburn. No dysphagia.    Current Outpatient Prescriptions  Medication Sig Dispense Refill  . aspirin 81 MG tablet Take 81 mg by mouth daily.      Marland Kitchen atorvastatin (LIPITOR) 10 MG tablet Take 10 mg by mouth daily.      Marland Kitchen ESTRACE VAGINAL 0.1 MG/GM vaginal cream Place 3.53 Applicatorfuls vaginally daily.  42.5 g  12  . levothyroxine (SYNTHROID, LEVOTHROID) 75 MCG tablet Take 75 mcg by mouth daily before breakfast.      . lisinopril (PRINIVIL,ZESTRIL) 10 MG tablet Take 10 mg by mouth daily.       No current facility-administered medications for this visit.    Allergies as of 10/31/2013 - Review Complete 10/31/2013  Allergen Reaction Noted  . Penicillins Rash 11/17/2012    Past Medical History  Diagnosis Date  . Hypertension   . Helicobacter pylori (H. pylori) infection 06/2011    treated  . Hypothyroidism     Past Surgical History  Procedure Laterality Date  . Abdominal hysterectomy  1990    complete  . Rectocele repair  10/2007  . Colonoscopy  09/09/2004    IRW:ERXVQM rectum and colon  . Appendectomy  1980  . Neck surgery  1983    Family History  Problem Relation Age  of Onset  . Colon cancer Sister     age 47s  . Bladder Cancer Brother   . Lung cancer Brother   . Myasthenia gravis Sister     History   Social History  . Marital Status: Widowed    Spouse Name: N/A    Number of Children: N/A  . Years of Education: N/A   Occupational History  . Not on file.   Social History Main Topics  . Smoking status: Never Smoker   . Smokeless tobacco: Never Used  . Alcohol Use: No  . Drug Use: No  . Sexual Activity: No   Other Topics Concern  . Not on file   Social History Narrative  . No narrative on file      ROS:  General: Negative for anorexia, weight loss, fever, chills, fatigue, weakness. Eyes: Negative for vision changes.  ENT: Negative for hoarseness, difficulty swallowing , nasal congestion. CV: Negative for chest pain, angina, palpitations, dyspnea on exertion, peripheral edema.  Respiratory: Negative for dyspnea at rest, dyspnea on exertion, cough, sputum, wheezing.  GI: See history of present illness. GU:  Negative for dysuria, hematuria, urinary incontinence, urinary frequency, nocturnal urination.  MS: Negative for joint pain, low back pain.  Derm: Negative for rash or itching.  Neuro: Negative for weakness, abnormal sensation, seizure, frequent headaches, memory loss, confusion.  Psych: Negative for anxiety, depression, suicidal ideation, hallucinations.  Endo: Negative for unusual weight change.  Heme: Negative for bruising or bleeding. Allergy: Negative for rash or hives.    Physical Examination:  BP 137/82  Pulse 60  Temp(Src) 98.4 F (36.9 C) (Oral)  Ht 5\' 3"  (1.6 m)  Wt 171 lb 12.8 oz (77.928 kg)  BMI 30.44 kg/m2   General: Well-nourished, well-developed in no acute distress.  Head: Normocephalic, atraumatic.   Eyes: Conjunctiva pink, no icterus. Mouth: Oropharyngeal mucosa moist and pink , no lesions erythema or exudate. Neck: Supple without thyromegaly, masses, or lymphadenopathy.  Lungs: Clear to  auscultation bilaterally.  Heart: Regular rate and rhythm, no murmurs rubs or gallops.  Abdomen: Bowel sounds are normal, nontender, nondistended, no hepatosplenomegaly or masses, no abdominal bruits or    hernia , no rebound or guarding.   Rectal: Deferred Extremities: No lower extremity edema. No clubbing or deformities.  Neuro: Alert and oriented x 4 , grossly normal neurologically.  Skin: Warm and dry, no rash or jaundice.   Psych: Alert and cooperative, normal mood and affect.

## 2013-11-08 NOTE — Op Note (Signed)
[facility] [address] [place], [address]   COLONOSCOPY PROCEDURE REPORT  PATIENT: [name], [name]  MR#:         [id number] BIRTHDATE: 1941/07/16 , 72  yrs. old GENDER: Female ENDOSCOPIST: R.  Garfield Cornea, MD FACP FACG REFERRED BY:  Elsie Lincoln, M.D. PROCEDURE DATE:  11/08/2013 PROCEDURE:     Colonoscopy with biopsy  INDICATIONS: Hematochezia  INFORMED CONSENT:  The risks, benefits, alternatives and imponderables including but not limited to bleeding, perforation as well as the possibility of a missed lesion have been reviewed.  The potential for biopsy, lesion removal, etc. have also been discussed.  Questions have been answered.  All parties agreeable. Please see the history and physical in the medical record for more information.  MEDICATIONS: Versed 6 mg IV and Demerol 75 mg IV in divided doses. Zofran 4 mg IV  DESCRIPTION OF PROCEDURE:  After a digital rectal exam was performed, the EC-3890Li ([id number])  colonoscope was advanced from the anus through the rectum and colon to the area of the cecum, ileocecal valve and appendiceal orifice.  The cecum was deeply intubated.  These structures were well-seen and photographed for the record.  From the level of the cecum and ileocecal valve, the scope was slowly and cautiously withdrawn.  The mucosal surfaces were carefully surveyed utilizing scope tip deflection to facilitate fold flattening as needed.  The scope was pulled down into the rectum where a thorough examination including retroflexion was performed.    FINDINGS:  Adequate preparation. Internal hemorrhoids; otherwise, normal rectum. Long tortuous, redundant colon requiring a number of maneuvers including  changing of the patient's position and external abdominal pressure to reach the cecum. Patient was noted to have scattered left-sided diverticula. There was (1) diminutive polyp on the distal side of ileocecal valve; otherwise,  the remainder of the colonic mucosa normal.  THERAPEUTIC / DIAGNOSTIC MANEUVERS PERFORMED:  The above-mentioned polyp was cold biopsied/removed  COMPLICATIONS: none  CECAL WITHDRAWAL TIME:  9 minutes  IMPRESSION:  Internal hemorrhoids-likely source of hematochezia. Colonic diverticulosis. Single colonic polyp-removed as described above  RECOMMENDATIONS: Continue daily yogurt supplement. Followup on pathology. Course of Anusol suppositories. If rectal bleeding persists, we can offer hemorrhoid banding in the office.   _______________________________ eSigned:  R. Garfield Cornea, MD FACP [facility] 11/08/2013 11:37 AM   CC:    PATIENT NAME:  [name], [name] MR#: [id number]

## 2013-11-10 ENCOUNTER — Encounter (HOSPITAL_COMMUNITY): Payer: Self-pay | Admitting: Internal Medicine

## 2013-11-12 ENCOUNTER — Encounter: Payer: Self-pay | Admitting: Internal Medicine

## 2014-01-08 ENCOUNTER — Other Ambulatory Visit (HOSPITAL_COMMUNITY): Payer: Self-pay | Admitting: Family Medicine

## 2014-01-08 DIAGNOSIS — Z1231 Encounter for screening mammogram for malignant neoplasm of breast: Secondary | ICD-10-CM

## 2014-01-11 ENCOUNTER — Ambulatory Visit (HOSPITAL_COMMUNITY)
Admission: RE | Admit: 2014-01-11 | Discharge: 2014-01-11 | Disposition: A | Discharge status: Home / Self Care | Payer: Medicare Other | Source: Ambulatory Visit | Attending: Family Medicine | Admitting: Family Medicine

## 2014-01-11 DIAGNOSIS — Z1231 Encounter for screening mammogram for malignant neoplasm of breast: Secondary | ICD-10-CM | POA: Insufficient documentation

## 2014-03-21 ENCOUNTER — Encounter (HOSPITAL_COMMUNITY): Payer: Self-pay | Admitting: Pharmacy Technician

## 2014-03-21 ENCOUNTER — Encounter (HOSPITAL_COMMUNITY): Payer: Self-pay | Admitting: Physician Assistant

## 2014-03-21 DIAGNOSIS — Z9889 Other specified postprocedural states: Secondary | ICD-10-CM

## 2014-03-21 DIAGNOSIS — E039 Hypothyroidism, unspecified: Secondary | ICD-10-CM | POA: Diagnosis present

## 2014-03-21 DIAGNOSIS — T4145XA Adverse effect of unspecified anesthetic, initial encounter: Secondary | ICD-10-CM | POA: Diagnosis present

## 2014-03-21 DIAGNOSIS — T8859XA Other complications of anesthesia, initial encounter: Secondary | ICD-10-CM | POA: Diagnosis present

## 2014-03-21 DIAGNOSIS — R112 Nausea with vomiting, unspecified: Secondary | ICD-10-CM | POA: Diagnosis present

## 2014-03-21 DIAGNOSIS — M1711 Unilateral primary osteoarthritis, right knee: Secondary | ICD-10-CM | POA: Diagnosis present

## 2014-03-21 DIAGNOSIS — N390 Urinary tract infection, site not specified: Secondary | ICD-10-CM | POA: Diagnosis present

## 2014-03-21 DIAGNOSIS — I10 Essential (primary) hypertension: Secondary | ICD-10-CM | POA: Diagnosis present

## 2014-03-21 NOTE — H&P (Addendum)
TOTAL KNEE ADMISSION H&P  Patient is being admitted for right total knee arthroplasty.  Subjective:  Chief Complaint:right knee pain.  HPI: [name], 73 y.o. female, has a history of pain and functional disability in the right knee due to arthritis and has failed non-surgical conservative treatments for greater than 12 weeks to includeNSAID's and/or analgesics, corticosteriod injections, viscosupplementation injections, flexibility and strengthening excercises, supervised PT with diminished ADL's post treatment, use of assistive devices, weight reduction as appropriate and activity modification.  Onset of symptoms was gradual, starting 10 years ago with gradually worsening course since that time. The patient noted no past surgery on the right knee(s).  Patient currently rates pain in the right knee(s) at 10 out of 10 with activity. Patient has night pain, worsening of pain with activity and weight bearing, pain that interferes with activities of daily living, pain with passive range of motion, crepitus and joint swelling.  Patient has evidence of subchondral sclerosis, periarticular osteophytes and joint space narrowing by imaging studies. There is no active infection.  Patient Active Problem List   Diagnosis Date Noted  . Primary localized osteoarthritis of right knee   . Rectal bleeding 10/31/2013  . Change in bowel habits 10/31/2013  . FH: colon cancer 10/31/2013  . Perimenopausal vasomotor symptoms 11/17/2012   Past Medical History  Diagnosis Date  . Hypertension   . Helicobacter pylori (H. pylori) infection 06/2011    treated  . Hypothyroidism   . Complication of anesthesia   . PONV (postoperative nausea and vomiting)   . Primary localized osteoarthritis of right knee     Past Surgical History  Procedure Laterality Date  . Abdominal hysterectomy  1990    complete  . Rectocele repair  10/2007  . Colonoscopy  09/09/2004    UXN:ATFTDD rectum and colon  . Appendectomy  1980   . Neck surgery  1983  . Colonoscopy N/A 11/08/2013    Procedure: COLONOSCOPY;  Surgeon: Daneil Dolin, MD;  Location: [facility];  Service: Endoscopy;  Laterality: N/A;  10:45    No prescriptions prior to admission   No current facility-administered medications for this encounter. Current outpatient prescriptions:aspirin 81 MG tablet, Take 81 mg by mouth at bedtime. , Disp: , Rfl: ;  atorvastatin (LIPITOR) 10 MG tablet, Take 10 mg by mouth at bedtime. , Disp: , Rfl: ;  estradiol (ESTRACE VAGINAL) 0.1 MG/GM vaginal cream, Place 1.5 g vaginally 2 (two) times a week. Uses on Mondays and Thursdays, Disp: , Rfl: ;  levothyroxine (SYNTHROID, LEVOTHROID) 75 MCG tablet, Take 75 mcg by mouth daily before breakfast., Disp: , Rfl:  lisinopril (PRINIVIL,ZESTRIL) 10 MG tablet, Take 10 mg by mouth at bedtime. , Disp: , Rfl: ;  polyethylene glycol-electrolytes (TRILYTE) 420 G solution, Take 4,000 mLs by mouth as directed., Disp: 4000 mL, Rfl: 0  Allergies  Allergen Reactions  . Penicillins Rash    History  Substance Use Topics  . Smoking status: Never Smoker   . Smokeless tobacco: Never Used  . Alcohol Use: No    Family History  Problem Relation Age of Onset  . Colon cancer Sister     age 67s  . Bladder Cancer Brother   . Lung cancer Brother   . Myasthenia gravis Sister      Review of Systems  Constitutional: Negative.   HENT: Negative.   Eyes: Negative.   Respiratory: Negative.   Cardiovascular: Negative.   Gastrointestinal: Negative.   Genitourinary: Negative.   Musculoskeletal: Positive for  back pain and joint pain.  Skin: Negative.   Neurological: Negative.   Endo/Heme/Allergies: Negative.   Psychiatric/Behavioral: Negative.     Objective:  Physical Exam  Constitutional: She is oriented to person, place, and time. She appears well-developed and well-nourished.  HENT:  Head: Normocephalic and atraumatic.  Eyes: Conjunctivae are normal. Pupils are equal, round, and reactive  to light.  Neck: Neck supple.  Cardiovascular: Normal rate and regular rhythm.   Respiratory: Effort normal.  GI: Soft.  Genitourinary:  Not pertinent to current symptomatology therefore not examined.  Musculoskeletal:   Examination of both knees reveals pain bilaterally medially and laterally, 1+ synovitis 1+ crepitation, full range of motion both knees are stable with normal patella tracking. Right more painful than left  Neurological: She is alert and oriented to person, place, and time.  Skin: Skin is warm and dry.  Psychiatric: She has a normal mood and affect. Her behavior is normal.    Vital signs in last 24 hours:    Labs:   Estimated body mass index is 30.44 kg/(m^2) as calculated from the following:   Height as of 10/31/13: 5\' 3"  (1.6 m).   Weight as of 10/31/13: 77.928 kg (171 lb 12.8 oz).   Imaging Review Plain radiographs demonstrate severe degenerative joint disease of the right knee(s). The overall alignment issignificant varus. The bone quality appears to be good for age and reported activity level.  Assessment/Plan:  End stage arthritis, right knee  Principal Problem:   Primary localized osteoarthritis of right knee Active Problems:   PONV (postoperative nausea and vomiting)   Complication of anesthesia   Hypertension   Hypothyroidism   Recurrent urinary tract infection  The patient history, physical examination, clinical judgment of the provider and imaging studies are consistent with end stage degenerative joint disease of the right knee(s) and total knee arthroplasty is deemed medically necessary. The treatment options including medical management, injection therapy arthroscopy and arthroplasty were discussed at length. The risks and benefits of total knee arthroplasty were presented and reviewed. The risks due to aseptic loosening, infection, stiffness, patella tracking problems, thromboembolic complications and other imponderables were discussed. The  patient acknowledged the explanation, agreed to proceed with the plan and consent was signed. Patient is being admitted for inpatient treatment for surgery, pain control, PT, OT, prophylactic antibiotics, VTE prophylaxis, progressive ambulation and ADL's and discharge planning. The patient is planning to be discharged home with home health services  Antibiotics in the cement due to recurrent urinary tract infections.  She currently has an E Coli UTI.  She started Ceftin on 03/27/2014 to treat this.  Tameka Hoiland A. Kaleen Mask Physician Assistant [facility] [phone]  03/21/2014, 3:38 PM

## 2014-03-22 NOTE — Pre-Procedure Instructions (Signed)
[name]  03/22/2014   Your procedure is scheduled on:  Wed, Sept 16 @ 11:00 AM  Report to [facility]  at 9:00  AM.  Call this number if you have problems the morning of surgery: [phone]   Remember:   Do not eat food or drink liquids after midnight.   Take these medicines the morning of surgery with A SIP OF WATER: Synthroid(Levothyroxine) and Eye Drops              Stop taking your Aspirin. No Goody's,BC's,Aleve,Ibuprofen,Fish Oil,or any Herbal Mediciatons   Do not wear jewelry, make-up or nail polish.  Do not wear lotions, powders, or perfumes. You may wear deodorant.  Do not shave 48 hours prior to surgery.  Do not bring valuables to the hospital.  [facility] is not responsible                  for any belongings or valuables.               Contacts, dentures or bridgework may not be worn into surgery.  Leave suitcase in the car. After surgery it may be brought to your room.  For patients admitted to the hospital, discharge time is determined by your                treatment team.               Special Instructions:  [place] - Preparing for Surgery  Before surgery, you can play an important role.  Because skin is not sterile, your skin needs to be as free of germs as possible.  You can reduce the number of germs on you skin by washing with CHG (chlorahexidine gluconate) soap before surgery.  CHG is an antiseptic cleaner which kills germs and bonds with the skin to continue killing germs even after washing.  Please DO NOT use if you have an allergy to CHG or antibacterial soaps.  If your skin becomes reddened/irritated stop using the CHG and inform your nurse when you arrive at Short Stay.  Do not shave (including legs and underarms) for at least 48 hours prior to the first CHG shower.  You may shave your face.  Please follow these instructions carefully:   1.  Shower with CHG Soap the night before surgery and the                                morning of  Surgery.  2.  If you choose to wash your hair, wash your hair first as usual with your       normal shampoo.  3.  After you shampoo, rinse your hair and body thoroughly to remove the                      Shampoo.  4.  Use CHG as you would any other liquid soap.  You can apply chg directly       to the skin and wash gently with scrungie or a clean washcloth.  5.  Apply the CHG Soap to your body ONLY FROM THE NECK DOWN.        Do not use on open wounds or open sores.  Avoid contact with your eyes,       ears, mouth and genitals (private parts).  Wash genitals (private parts)       with  your normal soap.  6.  Wash thoroughly, paying special attention to the area where your surgery        will be performed.  7.  Thoroughly rinse your body with warm water from the neck down.  8.  DO NOT shower/wash with your normal soap after using and rinsing off       the CHG Soap.  9.  Pat yourself dry with a clean towel.            10.  Wear clean pajamas.            11.  Place clean sheets on your bed the night of your first shower and do not        sleep with pets.  Day of Surgery  Do not apply any lotions/deoderants the morning of surgery.  Please wear clean clothes to the hospital/surgery center.     Please read over the following fact sheets that you were given: Pain Booklet, Coughing and Deep Breathing, Blood Transfusion Information, MRSA Information and Surgical Site Infection Prevention

## 2014-03-23 ENCOUNTER — Encounter (HOSPITAL_COMMUNITY)
Admission: RE | Admit: 2014-03-23 | Discharge: 2014-03-23 | Disposition: A | Discharge status: Home / Self Care | Payer: Medicare Other | Source: Ambulatory Visit | Attending: Physician Assistant | Admitting: Physician Assistant

## 2014-03-23 ENCOUNTER — Encounter (HOSPITAL_COMMUNITY)
Admission: RE | Admit: 2014-03-23 | Discharge: 2014-03-23 | Disposition: A | Discharge status: Home / Self Care | Payer: Medicare Other | Source: Ambulatory Visit | Attending: Orthopedic Surgery | Admitting: Orthopedic Surgery

## 2014-03-23 ENCOUNTER — Encounter (HOSPITAL_COMMUNITY): Payer: Self-pay

## 2014-03-23 DIAGNOSIS — Z01818 Encounter for other preprocedural examination: Secondary | ICD-10-CM | POA: Insufficient documentation

## 2014-03-23 DIAGNOSIS — M1710 Unilateral primary osteoarthritis, unspecified knee: Secondary | ICD-10-CM | POA: Diagnosis not present

## 2014-03-23 HISTORY — DX: Unspecified cataract: H26.9

## 2014-03-23 HISTORY — DX: Personal history of colonic polyps: Z86.010

## 2014-03-23 HISTORY — DX: Dorsalgia, unspecified: M54.9

## 2014-03-23 HISTORY — DX: Frequency of micturition: R35.0

## 2014-03-23 HISTORY — DX: Dizziness and giddiness: R42

## 2014-03-23 HISTORY — DX: Dry eye syndrome of bilateral lacrimal glands: H04.123

## 2014-03-23 HISTORY — DX: Constipation, unspecified: K59.00

## 2014-03-23 HISTORY — DX: Pneumonia, unspecified organism: J18.9

## 2014-03-23 HISTORY — DX: Hyperlipidemia, unspecified: E78.5

## 2014-03-23 HISTORY — DX: Pain in unspecified joint: M25.50

## 2014-03-23 HISTORY — DX: Personal history of colon polyps, unspecified: Z86.0100

## 2014-03-23 HISTORY — DX: Urgency of urination: R39.15

## 2014-03-23 LAB — COMPREHENSIVE METABOLIC PANEL
ALT: 17 U/L (ref 0–35)
AST: 21 U/L (ref 0–37)
Albumin: 3.7 g/dL (ref 3.5–5.2)
Alkaline Phosphatase: 75 U/L (ref 39–117)
Anion gap: 13 (ref 5–15)
BUN: 17 mg/dL (ref 6–23)
CO2: 23 meq/L (ref 19–32)
CREATININE: 0.74 mg/dL (ref 0.50–1.10)
Calcium: 9 mg/dL (ref 8.4–10.5)
Chloride: 103 mEq/L (ref 96–112)
GFR, EST NON AFRICAN AMERICAN: 84 mL/min — AB (ref >90)
GLUCOSE: 100 mg/dL — AB (ref 70–99)
Potassium: 4.6 mEq/L (ref 3.7–5.3)
Sodium: 139 mEq/L (ref 137–147)
Total Bilirubin: 0.2 mg/dL — ABNORMAL LOW (ref 0.3–1.2)
Total Protein: 6.9 g/dL (ref 6.0–8.3)

## 2014-03-23 LAB — URINALYSIS, ROUTINE W REFLEX MICROSCOPIC
BILIRUBIN URINE: NEGATIVE
GLUCOSE, UA: NEGATIVE mg/dL
Hgb urine dipstick: NEGATIVE
KETONES UR: NEGATIVE mg/dL
LEUKOCYTES UA: NEGATIVE
Nitrite: NEGATIVE
Protein, ur: NEGATIVE mg/dL
Specific Gravity, Urine: 1.007 (ref 1.005–1.030)
Urobilinogen, UA: 0.2 mg/dL (ref 0.0–1.0)
pH: 7 (ref 5.0–8.0)

## 2014-03-23 LAB — CBC WITH DIFFERENTIAL/PLATELET
Basophils Absolute: 0 10*3/uL (ref 0.0–0.1)
Basophils Relative: 0 % (ref 0–1)
EOS PCT: 1 % (ref 0–5)
Eosinophils Absolute: 0.1 10*3/uL (ref 0.0–0.7)
HEMATOCRIT: 42.3 % (ref 36.0–46.0)
Hemoglobin: 14.1 g/dL (ref 12.0–15.0)
LYMPHS ABS: 2.7 10*3/uL (ref 0.7–4.0)
LYMPHS PCT: 41 % (ref 12–46)
MCH: 29.9 pg (ref 26.0–34.0)
MCHC: 33.3 g/dL (ref 30.0–36.0)
MCV: 89.6 fL (ref 78.0–100.0)
MONO ABS: 0.5 10*3/uL (ref 0.1–1.0)
MONOS PCT: 7 % (ref 3–12)
Neutro Abs: 3.3 10*3/uL (ref 1.7–7.7)
Neutrophils Relative %: 51 % (ref 43–77)
Platelets: 192 10*3/uL (ref 150–400)
RBC: 4.72 MIL/uL (ref 3.87–5.11)
RDW: 13.2 % (ref 11.5–15.5)
WBC: 6.6 10*3/uL (ref 4.0–10.5)

## 2014-03-23 LAB — APTT: aPTT: 29 seconds (ref 24–37)

## 2014-03-23 LAB — PROTIME-INR
INR: 0.95 (ref 0.00–1.49)
PROTHROMBIN TIME: 12.7 s (ref 11.6–15.2)

## 2014-03-23 LAB — SURGICAL PCR SCREEN
MRSA, PCR: NEGATIVE
Staphylococcus aureus: NEGATIVE

## 2014-03-23 LAB — TYPE AND SCREEN
ABO/RH(D): A POS
Antibody Screen: NEGATIVE

## 2014-03-23 LAB — ABO/RH: ABO/RH(D): A POS

## 2014-03-23 MED ORDER — POVIDONE-IODINE 7.5 % EX SOLN: Freq: Once | CUTANEOUS | Status: DC

## 2014-03-23 MED ORDER — CHLORHEXIDINE GLUCONATE 4 % EX LIQD: 60.0000 mL | Freq: Once | CUTANEOUS | Status: DC

## 2014-03-23 NOTE — Progress Notes (Signed)
Pt states a couple of weeks ago noticed some pain in chest but chewed an alka seltzer which relieved pain instantly

## 2014-03-23 NOTE — Progress Notes (Signed)
Spoke with Ebony Hail about pt regarding pain in chest;have pt to go to Medical Md for Medical Clearance  I personally called and got an appointment for this pt on Sept 14 @ 1:40

## 2014-03-23 NOTE — Progress Notes (Signed)
Pt refused to watch the Emmi video

## 2014-03-23 NOTE — Progress Notes (Addendum)
  Saw a cardiologist in 2011 and was determined to be acid reflux  Denies ever having an echo/stress test/heart cath  Medical Md is Dr. Elsie Lincoln  Denies EKG or CXR in past yr

## 2014-03-25 LAB — URINE CULTURE: Colony Count: 50000

## 2014-03-28 NOTE — Progress Notes (Addendum)
Anesthesia Chart Review:  Pt is 73 year old female scheduled for R total knee replacement on 04/04/14 with Dr. Noemi Chapel.   PMH: HTN, hyperlipidemia, hypothyroidism Hx post-operative nausea and vomiting.   Medications include: lisinopril, levothyroxine  Preoperative labs reviewed.    Chest x-ray reviewed.   EKG reviewed. NSR  Pt reported to RN at PAT that she had chest pain a couple of weeks ago relieved by chewing an alka seltzer which immediately relieved pain. Beyond HTN does not have cardiac hx. Per PAT RN's notes, pt saw cardiologist in 2011 and her problem was determined to be acid reflux. Denies ever having echo/stress/heart cath. PCP is [facility].   Myra Gianotti was notified of pt's recent chest pain. Pt informed she would need medical clearance prior to surgery.   Pt has appt with pcp 04/02/14.   Willeen Cass, FNP-BC [facility] Short Stay Surgical Center/Anesthesiology Phone: [phone] 03/28/2014 4:07 PM  Addendum:  I received a medical and cardiac clearance note addressed to her PCP Dr. Orson Ape and signed on 02/22/14.  She was seen again for re-evaluation on 04/02/14 and was cleared by Delman Cheadle, PA.   George Hugh [facility] Short Stay Center/Anesthesiology Phone [phone] 04/03/2014 2:23 PM

## 2014-04-03 MED ORDER — VANCOMYCIN HCL IN DEXTROSE 1-5 GM/200ML-% IV SOLN
1000.0000 mg | INTRAVENOUS | Status: AC
  Administered 2014-04-04: 1000 mg via INTRAVENOUS
  Filled 2014-04-03: qty 200

## 2014-04-03 NOTE — Progress Notes (Signed)
Pt notified of time change;to arrive at 0800-verbalized understanding

## 2014-04-04 ENCOUNTER — Encounter (HOSPITAL_COMMUNITY)
Admission: RE | Disposition: A | Discharge status: Home Health | Payer: Self-pay | Source: Ambulatory Visit | Attending: Orthopedic Surgery

## 2014-04-04 ENCOUNTER — Inpatient Hospital Stay (HOSPITAL_COMMUNITY)
Admission: RE | Admit: 2014-04-04 | Discharge: 2014-04-06 | DRG: 470 | Disposition: A | Discharge status: Home Health | Payer: Medicare Other | Source: Ambulatory Visit | Attending: Orthopedic Surgery | Admitting: Orthopedic Surgery

## 2014-04-04 ENCOUNTER — Encounter (HOSPITAL_COMMUNITY): Payer: Medicare Other | Admitting: Vascular Surgery

## 2014-04-04 ENCOUNTER — Inpatient Hospital Stay (HOSPITAL_COMMUNITY): Payer: Medicare Other | Admitting: Anesthesiology

## 2014-04-04 DIAGNOSIS — Z880 Allergy status to penicillin: Secondary | ICD-10-CM | POA: Diagnosis not present

## 2014-04-04 DIAGNOSIS — Z7982 Long term (current) use of aspirin: Secondary | ICD-10-CM

## 2014-04-04 DIAGNOSIS — R112 Nausea with vomiting, unspecified: Secondary | ICD-10-CM | POA: Diagnosis not present

## 2014-04-04 DIAGNOSIS — T8859XA Other complications of anesthesia, initial encounter: Secondary | ICD-10-CM | POA: Diagnosis present

## 2014-04-04 DIAGNOSIS — A498 Other bacterial infections of unspecified site: Secondary | ICD-10-CM | POA: Diagnosis present

## 2014-04-04 DIAGNOSIS — M1711 Unilateral primary osteoarthritis, right knee: Secondary | ICD-10-CM | POA: Diagnosis present

## 2014-04-04 DIAGNOSIS — Z801 Family history of malignant neoplasm of trachea, bronchus and lung: Secondary | ICD-10-CM | POA: Diagnosis not present

## 2014-04-04 DIAGNOSIS — Z860100 Personal history of colon polyps, unspecified: Secondary | ICD-10-CM

## 2014-04-04 DIAGNOSIS — M1710 Unilateral primary osteoarthritis, unspecified knee: Secondary | ICD-10-CM | POA: Diagnosis present

## 2014-04-04 DIAGNOSIS — M179 Osteoarthritis of knee, unspecified: Secondary | ICD-10-CM | POA: Diagnosis present

## 2014-04-04 DIAGNOSIS — E785 Hyperlipidemia, unspecified: Secondary | ICD-10-CM | POA: Diagnosis present

## 2014-04-04 DIAGNOSIS — T4145XA Adverse effect of unspecified anesthetic, initial encounter: Secondary | ICD-10-CM | POA: Diagnosis present

## 2014-04-04 DIAGNOSIS — N390 Urinary tract infection, site not specified: Secondary | ICD-10-CM | POA: Diagnosis present

## 2014-04-04 DIAGNOSIS — Y849 Medical procedure, unspecified as the cause of abnormal reaction of the patient, or of later complication, without mention of misadventure at the time of the procedure: Secondary | ICD-10-CM | POA: Diagnosis not present

## 2014-04-04 DIAGNOSIS — I10 Essential (primary) hypertension: Secondary | ICD-10-CM | POA: Diagnosis present

## 2014-04-04 DIAGNOSIS — E039 Hypothyroidism, unspecified: Secondary | ICD-10-CM | POA: Diagnosis present

## 2014-04-04 DIAGNOSIS — T888XXA Other specified complications of surgical and medical care, not elsewhere classified, initial encounter: Secondary | ICD-10-CM | POA: Diagnosis not present

## 2014-04-04 DIAGNOSIS — Z79899 Other long term (current) drug therapy: Secondary | ICD-10-CM

## 2014-04-04 DIAGNOSIS — Y9210 Unspecified residential institution as the place of occurrence of the external cause: Secondary | ICD-10-CM | POA: Diagnosis not present

## 2014-04-04 DIAGNOSIS — Z800 Family history of malignant neoplasm of digestive organs: Secondary | ICD-10-CM

## 2014-04-04 DIAGNOSIS — T41205A Adverse effect of unspecified general anesthetics, initial encounter: Secondary | ICD-10-CM | POA: Diagnosis not present

## 2014-04-04 DIAGNOSIS — Z8052 Family history of malignant neoplasm of bladder: Secondary | ICD-10-CM | POA: Diagnosis not present

## 2014-04-04 DIAGNOSIS — T801XXA Vascular complications following infusion, transfusion and therapeutic injection, initial encounter: Secondary | ICD-10-CM | POA: Diagnosis not present

## 2014-04-04 DIAGNOSIS — Z9889 Other specified postprocedural states: Secondary | ICD-10-CM

## 2014-04-04 DIAGNOSIS — R32 Unspecified urinary incontinence: Secondary | ICD-10-CM | POA: Diagnosis not present

## 2014-04-04 DIAGNOSIS — M25569 Pain in unspecified knee: Secondary | ICD-10-CM | POA: Diagnosis present

## 2014-04-04 HISTORY — DX: Unilateral primary osteoarthritis, right knee: M17.11

## 2014-04-04 HISTORY — PX: TOTAL KNEE ARTHROPLASTY: SHX125

## 2014-04-04 SURGERY — ARTHROPLASTY, KNEE, TOTAL
Anesthesia: General | Site: Knee | Laterality: Right

## 2014-04-04 MED ORDER — EPHEDRINE SULFATE 50 MG/ML IJ SOLN
INTRAMUSCULAR | Status: DC | PRN
  Administered 2014-04-04: 10 mg via INTRAVENOUS

## 2014-04-04 MED ORDER — POTASSIUM CHLORIDE IN NACL 20-0.9 MEQ/L-% IV SOLN
INTRAVENOUS | Status: DC
  Administered 2014-04-04 – 2014-04-05 (×2): via INTRAVENOUS
  Filled 2014-04-04 (×4): qty 1000

## 2014-04-04 MED ORDER — SODIUM CHLORIDE 0.9 % IR SOLN
Status: DC | PRN
  Administered 2014-04-04: 1000 mL

## 2014-04-04 MED ORDER — METOCLOPRAMIDE HCL 5 MG PO TABS
5.0000 mg | ORAL_TABLET | Freq: Three times a day (TID) | ORAL | Status: DC | PRN
  Filled 2014-04-04: qty 2

## 2014-04-04 MED ORDER — LORAZEPAM 0.5 MG PO TABS
0.5000 mg | ORAL_TABLET | Freq: Four times a day (QID) | ORAL | Status: DC | PRN
  Administered 2014-04-04 – 2014-04-05 (×2): 0.5 mg via ORAL
  Filled 2014-04-04 (×2): qty 1

## 2014-04-04 MED ORDER — DIPHENHYDRAMINE HCL 12.5 MG/5ML PO ELIX
12.5000 mg | ORAL_SOLUTION | ORAL | Status: DC | PRN
  Administered 2014-04-05: 25 mg via ORAL
  Filled 2014-04-04: qty 10

## 2014-04-04 MED ORDER — GLYCOPYRROLATE 0.2 MG/ML IJ SOLN
INTRAMUSCULAR | Status: DC | PRN
  Administered 2014-04-04: .7 mg via INTRAVENOUS

## 2014-04-04 MED ORDER — FENTANYL CITRATE 0.05 MG/ML IJ SOLN
INTRAMUSCULAR | Status: AC
  Filled 2014-04-04: qty 2

## 2014-04-04 MED ORDER — FENTANYL CITRATE 0.05 MG/ML IJ SOLN
INTRAMUSCULAR | Status: AC
  Administered 2014-04-04: 75 ug
  Filled 2014-04-04: qty 2

## 2014-04-04 MED ORDER — ACETAMINOPHEN 325 MG PO TABS
650.0000 mg | ORAL_TABLET | Freq: Four times a day (QID) | ORAL | Status: DC | PRN
  Filled 2014-04-04: qty 2

## 2014-04-04 MED ORDER — ALUM & MAG HYDROXIDE-SIMETH 200-200-20 MG/5ML PO SUSP: 30.0000 mL | ORAL | Status: DC | PRN

## 2014-04-04 MED ORDER — BUPIVACAINE-EPINEPHRINE 0.25% -1:200000 IJ SOLN
INTRAMUSCULAR | Status: DC | PRN
  Administered 2014-04-04: 30 mL

## 2014-04-04 MED ORDER — CELECOXIB 200 MG PO CAPS
200.0000 mg | ORAL_CAPSULE | Freq: Two times a day (BID) | ORAL | Status: DC
  Administered 2014-04-05 – 2014-04-06 (×3): 200 mg via ORAL
  Filled 2014-04-04 (×5): qty 1

## 2014-04-04 MED ORDER — DOCUSATE SODIUM 100 MG PO CAPS
100.0000 mg | ORAL_CAPSULE | Freq: Two times a day (BID) | ORAL | Status: DC
  Administered 2014-04-05 – 2014-04-06 (×3): 100 mg via ORAL
  Filled 2014-04-04 (×4): qty 1

## 2014-04-04 MED ORDER — PROPOFOL 10 MG/ML IV BOLUS
INTRAVENOUS | Status: AC
  Filled 2014-04-04: qty 20

## 2014-04-04 MED ORDER — FENTANYL CITRATE 0.05 MG/ML IJ SOLN
25.0000 ug | INTRAMUSCULAR | Status: DC | PRN
  Administered 2014-04-04 (×2): 50 ug via INTRAVENOUS

## 2014-04-04 MED ORDER — BUPIVACAINE-EPINEPHRINE (PF) 0.25% -1:200000 IJ SOLN
INTRAMUSCULAR | Status: AC
  Filled 2014-04-04: qty 30

## 2014-04-04 MED ORDER — FENTANYL CITRATE 0.05 MG/ML IJ SOLN
INTRAMUSCULAR | Status: DC | PRN
  Administered 2014-04-04: 50 ug via INTRAVENOUS
  Administered 2014-04-04: 150 ug via INTRAVENOUS
  Administered 2014-04-04: 50 ug via INTRAVENOUS

## 2014-04-04 MED ORDER — ONDANSETRON HCL 4 MG PO TABS: 4.0000 mg | ORAL_TABLET | Freq: Four times a day (QID) | ORAL | Status: DC | PRN

## 2014-04-04 MED ORDER — CLINDAMYCIN PHOSPHATE 600 MG/50ML IV SOLN
600.0000 mg | Freq: Four times a day (QID) | INTRAVENOUS | Status: AC
  Administered 2014-04-04 (×2): 600 mg via INTRAVENOUS
  Filled 2014-04-04 (×2): qty 50

## 2014-04-04 MED ORDER — ONDANSETRON HCL 4 MG/2ML IJ SOLN
INTRAMUSCULAR | Status: DC | PRN
  Administered 2014-04-04: 4 mg via INTRAVENOUS

## 2014-04-04 MED ORDER — DEXAMETHASONE SODIUM PHOSPHATE 10 MG/ML IJ SOLN
10.0000 mg | Freq: Three times a day (TID) | INTRAMUSCULAR | Status: AC
  Administered 2014-04-04 – 2014-04-05 (×3): 10 mg via INTRAVENOUS
  Filled 2014-04-04 (×3): qty 1

## 2014-04-04 MED ORDER — METOCLOPRAMIDE HCL 5 MG/ML IJ SOLN
INTRAMUSCULAR | Status: AC
  Filled 2014-04-04: qty 2

## 2014-04-04 MED ORDER — MIDAZOLAM HCL 2 MG/2ML IJ SOLN
INTRAMUSCULAR | Status: AC
  Administered 2014-04-04: 2 mg
  Filled 2014-04-04: qty 2

## 2014-04-04 MED ORDER — HYDROMORPHONE HCL 1 MG/ML IJ SOLN
1.0000 mg | INTRAMUSCULAR | Status: DC | PRN
  Administered 2014-04-04 – 2014-04-05 (×8): 1 mg via INTRAVENOUS
  Filled 2014-04-04 (×8): qty 1

## 2014-04-04 MED ORDER — LACTATED RINGERS IV SOLN
INTRAVENOUS | Status: DC | PRN
  Administered 2014-04-04 (×2): via INTRAVENOUS

## 2014-04-04 MED ORDER — PROPOFOL 10 MG/ML IV BOLUS
INTRAVENOUS | Status: DC | PRN
  Administered 2014-04-04: 160 mg via INTRAVENOUS

## 2014-04-04 MED ORDER — ACETAMINOPHEN 650 MG RE SUPP: 650.0000 mg | Freq: Four times a day (QID) | RECTAL | Status: DC | PRN

## 2014-04-04 MED ORDER — BUPIVACAINE LIPOSOME 1.3 % IJ SUSP
20.0000 mL | INTRAMUSCULAR | Status: DC
  Filled 2014-04-04: qty 20

## 2014-04-04 MED ORDER — TOBRAMYCIN SULFATE 1.2 G IJ SOLR
INTRAMUSCULAR | Status: AC
  Filled 2014-04-04: qty 1.2

## 2014-04-04 MED ORDER — ROCURONIUM BROMIDE 100 MG/10ML IV SOLN
INTRAVENOUS | Status: DC | PRN
  Administered 2014-04-04: 40 mg via INTRAVENOUS

## 2014-04-04 MED ORDER — RIVAROXABAN 10 MG PO TABS
10.0000 mg | ORAL_TABLET | Freq: Every day | ORAL | Status: DC
  Administered 2014-04-05: 10 mg via ORAL
  Filled 2014-04-04 (×4): qty 1

## 2014-04-04 MED ORDER — CARBOXYMETHYLCELLULOSE SODIUM 1 % OP SOLN: 1.0000 [drp] | Freq: Every day | OPHTHALMIC | Status: DC | PRN

## 2014-04-04 MED ORDER — POLYETHYLENE GLYCOL 3350 17 G PO PACK
17.0000 g | PACK | Freq: Two times a day (BID) | ORAL | Status: DC
  Administered 2014-04-05 – 2014-04-06 (×2): 17 g via ORAL
  Filled 2014-04-04 (×5): qty 1

## 2014-04-04 MED ORDER — DEXAMETHASONE SODIUM PHOSPHATE 10 MG/ML IJ SOLN
INTRAMUSCULAR | Status: DC | PRN
  Administered 2014-04-04: 10 mg via INTRAVENOUS

## 2014-04-04 MED ORDER — ONDANSETRON HCL 4 MG/2ML IJ SOLN
INTRAMUSCULAR | Status: AC
  Filled 2014-04-04: qty 2

## 2014-04-04 MED ORDER — LEVOTHYROXINE SODIUM 75 MCG PO TABS
75.0000 ug | ORAL_TABLET | Freq: Every day | ORAL | Status: DC
  Administered 2014-04-05 – 2014-04-06 (×2): 75 ug via ORAL
  Filled 2014-04-04 (×3): qty 1

## 2014-04-04 MED ORDER — HYDROMORPHONE HCL 1 MG/ML IJ SOLN
INTRAMUSCULAR | Status: AC
  Filled 2014-04-04: qty 1

## 2014-04-04 MED ORDER — DEXAMETHASONE 6 MG PO TABS
10.0000 mg | ORAL_TABLET | Freq: Three times a day (TID) | ORAL | Status: AC
  Filled 2014-04-04 (×3): qty 1

## 2014-04-04 MED ORDER — LISINOPRIL 10 MG PO TABS
10.0000 mg | ORAL_TABLET | Freq: Every day | ORAL | Status: DC
  Administered 2014-04-05: 10 mg via ORAL
  Filled 2014-04-04 (×2): qty 1

## 2014-04-04 MED ORDER — LIDOCAINE HCL (CARDIAC) 20 MG/ML IV SOLN
INTRAVENOUS | Status: DC | PRN
  Administered 2014-04-04: 50 mg via INTRAVENOUS

## 2014-04-04 MED ORDER — OXYCODONE HCL 5 MG PO TABS
5.0000 mg | ORAL_TABLET | ORAL | Status: DC | PRN
  Administered 2014-04-04 – 2014-04-05 (×3): 10 mg via ORAL
  Filled 2014-04-04 (×2): qty 2

## 2014-04-04 MED ORDER — NEOSTIGMINE METHYLSULFATE 10 MG/10ML IV SOLN
INTRAVENOUS | Status: DC | PRN
  Administered 2014-04-04: 4 mg via INTRAVENOUS

## 2014-04-04 MED ORDER — POLYVINYL ALCOHOL 1.4 % OP SOLN
1.0000 [drp] | OPHTHALMIC | Status: DC | PRN
Start: 2014-04-04 — End: 2014-04-06
  Filled 2014-04-04: qty 15

## 2014-04-04 MED ORDER — LACTATED RINGERS IV SOLN
INTRAVENOUS | Status: DC
  Administered 2014-04-04: 09:00:00 via INTRAVENOUS

## 2014-04-04 MED ORDER — FENTANYL CITRATE 0.05 MG/ML IJ SOLN
INTRAMUSCULAR | Status: AC
  Filled 2014-04-04: qty 5

## 2014-04-04 MED ORDER — ONDANSETRON HCL 4 MG/2ML IJ SOLN
4.0000 mg | Freq: Four times a day (QID) | INTRAMUSCULAR | Status: DC | PRN
  Administered 2014-04-04 (×2): 4 mg via INTRAVENOUS
  Filled 2014-04-04: qty 2

## 2014-04-04 MED ORDER — TOBRAMYCIN SULFATE 1.2 G IJ SOLR
INTRAMUSCULAR | Status: DC | PRN
  Administered 2014-04-04: 1.2 g

## 2014-04-04 MED ORDER — METOCLOPRAMIDE HCL 5 MG/ML IJ SOLN
5.0000 mg | Freq: Three times a day (TID) | INTRAMUSCULAR | Status: DC | PRN
  Administered 2014-04-04 – 2014-04-05 (×3): 10 mg via INTRAVENOUS
  Filled 2014-04-04 (×2): qty 2

## 2014-04-04 MED ORDER — OXYCODONE HCL 5 MG PO TABS
ORAL_TABLET | ORAL | Status: AC
  Filled 2014-04-04: qty 2

## 2014-04-04 SURGICAL SUPPLY — 71 items
BANDAGE ELASTIC 6 VELCRO ST LF (GAUZE/BANDAGES/DRESSINGS) ×2 IMPLANT
BANDAGE ESMARK 6X9 LF (GAUZE/BANDAGES/DRESSINGS) ×1 IMPLANT
BLADE SAGITTAL 25.0X1.19X90 (BLADE) ×2 IMPLANT
BLADE SAGITTAL 25.0X1.19X90MM (BLADE) ×1
BLADE SAW SGTL 11.0X1.19X90.0M (BLADE) ×2 IMPLANT
BLADE SAW SGTL 13.0X1.19X90.0M (BLADE) ×3 IMPLANT
BLADE SURG 10 STRL SS (BLADE) ×6 IMPLANT
BNDG CMPR 9X6 STRL LF SNTH (GAUZE/BANDAGES/DRESSINGS) ×1
BNDG CMPR MED 15X6 ELC VLCR LF (GAUZE/BANDAGES/DRESSINGS) ×1
BNDG ELASTIC 6X15 VLCR STRL LF (GAUZE/BANDAGES/DRESSINGS) ×3 IMPLANT
BNDG ESMARK 6X9 LF (GAUZE/BANDAGES/DRESSINGS) ×3
BOWL SMART MIX CTS (DISPOSABLE) ×3 IMPLANT
CAPT RP KNEE ×2 IMPLANT
CEMENT HV SMART SET (Cement) ×6 IMPLANT
CLOSURE WOUND 1/2 X4 (GAUZE/BANDAGES/DRESSINGS) ×1
COVER SURGICAL LIGHT HANDLE (MISCELLANEOUS) ×3 IMPLANT
CUFF TOURNIQUET SINGLE 34IN LL (TOURNIQUET CUFF) ×3 IMPLANT
CUFF TOURNIQUET SINGLE 44IN (TOURNIQUET CUFF) IMPLANT
DRAPE EXTREMITY T 121X128X90 (DRAPE) ×3 IMPLANT
DRAPE INCISE IOBAN 66X45 STRL (DRAPES) ×3 IMPLANT
DRAPE PROXIMA HALF (DRAPES) ×3 IMPLANT
DRAPE U-SHAPE 47X51 STRL (DRAPES) ×3 IMPLANT
DRSG ADAPTIC 3X8 NADH LF (GAUZE/BANDAGES/DRESSINGS) ×3 IMPLANT
DRSG AQUACEL AG ADV 3.5X14 (GAUZE/BANDAGES/DRESSINGS) ×2 IMPLANT
DRSG PAD ABDOMINAL 8X10 ST (GAUZE/BANDAGES/DRESSINGS) ×9 IMPLANT
DURAPREP 26ML APPLICATOR (WOUND CARE) ×6 IMPLANT
ELECT CAUTERY BLADE 6.4 (BLADE) ×3 IMPLANT
ELECT REM PT RETURN 9FT ADLT (ELECTROSURGICAL) ×3
ELECTRODE REM PT RTRN 9FT ADLT (ELECTROSURGICAL) ×1 IMPLANT
EVACUATOR 1/8 PVC DRAIN (DRAIN) ×3 IMPLANT
FACESHIELD WRAPAROUND (MASK) ×3 IMPLANT
FACESHIELD WRAPAROUND OR TEAM (MASK) ×1 IMPLANT
GAUZE SPONGE 4X4 12PLY STRL (GAUZE/BANDAGES/DRESSINGS) ×3 IMPLANT
GLOVE BIO SURGEON STRL SZ7 (GLOVE) ×3 IMPLANT
GLOVE BIOGEL PI IND STRL 7.0 (GLOVE) ×1 IMPLANT
GLOVE BIOGEL PI IND STRL 7.5 (GLOVE) ×1 IMPLANT
GLOVE BIOGEL PI INDICATOR 7.0 (GLOVE) ×2
GLOVE BIOGEL PI INDICATOR 7.5 (GLOVE) ×2
GLOVE SS BIOGEL STRL SZ 7.5 (GLOVE) ×1 IMPLANT
GLOVE SUPERSENSE BIOGEL SZ 7.5 (GLOVE) ×2
GOWN STRL REUS W/ TWL LRG LVL3 (GOWN DISPOSABLE) ×2 IMPLANT
GOWN STRL REUS W/ TWL XL LVL3 (GOWN DISPOSABLE) ×2 IMPLANT
GOWN STRL REUS W/TWL LRG LVL3 (GOWN DISPOSABLE) ×6
GOWN STRL REUS W/TWL XL LVL3 (GOWN DISPOSABLE) ×6
HANDPIECE INTERPULSE COAX TIP (DISPOSABLE) ×3
HOOD PEEL AWAY FACE SHEILD DIS (HOOD) ×6 IMPLANT
IMMOBILIZER KNEE 22 UNIV (SOFTGOODS) ×2 IMPLANT
KIT BASIN OR (CUSTOM PROCEDURE TRAY) ×3 IMPLANT
KIT ROOM TURNOVER OR (KITS) ×3 IMPLANT
MANIFOLD NEPTUNE II (INSTRUMENTS) ×3 IMPLANT
MARKER SKIN DUAL TIP RULER LAB (MISCELLANEOUS) ×3 IMPLANT
NS IRRIG 1000ML POUR BTL (IV SOLUTION) ×3 IMPLANT
PACK TOTAL JOINT (CUSTOM PROCEDURE TRAY) ×3 IMPLANT
PAD ARMBOARD 7.5X6 YLW CONV (MISCELLANEOUS) ×6 IMPLANT
PADDING CAST COTTON 6X4 STRL (CAST SUPPLIES) ×3 IMPLANT
RUBBERBAND STERILE (MISCELLANEOUS) ×3 IMPLANT
SET HNDPC FAN SPRY TIP SCT (DISPOSABLE) ×1 IMPLANT
SPONGE GAUZE 4X4 12PLY STER LF (GAUZE/BANDAGES/DRESSINGS) ×2 IMPLANT
STRIP CLOSURE SKIN 1/2X4 (GAUZE/BANDAGES/DRESSINGS) ×2 IMPLANT
SUCTION FRAZIER TIP 10 FR DISP (SUCTIONS) ×3 IMPLANT
SUT ETHIBOND NAB CT1 #1 30IN (SUTURE) ×6 IMPLANT
SUT MNCRL AB 3-0 PS2 18 (SUTURE) ×3 IMPLANT
SUT VIC AB 0 CT1 27 (SUTURE) ×6
SUT VIC AB 0 CT1 27XBRD ANBCTR (SUTURE) ×2 IMPLANT
SUT VIC AB 2-0 CT1 27 (SUTURE) ×6
SUT VIC AB 2-0 CT1 TAPERPNT 27 (SUTURE) ×2 IMPLANT
SYR 30ML SLIP (SYRINGE) ×3 IMPLANT
TOWEL OR 17X24 6PK STRL BLUE (TOWEL DISPOSABLE) ×3 IMPLANT
TOWEL OR 17X26 10 PK STRL BLUE (TOWEL DISPOSABLE) ×3 IMPLANT
TRAY FOLEY CATH 16FR SILVER (SET/KITS/TRAYS/PACK) ×3 IMPLANT
WATER STERILE IRR 1000ML POUR (IV SOLUTION) ×6 IMPLANT

## 2014-04-04 NOTE — Anesthesia Procedure Notes (Addendum)
Procedure Name: Intubation Date/Time: 04/04/2014 10:16 AM Performed by: Eligha Bridegroom Pre-anesthesia Checklist: Patient identified, Patient being monitored, Emergency Drugs available, Timeout performed and Suction available Patient Re-evaluated:Patient Re-evaluated prior to inductionOxygen Delivery Method: Circle system utilized Preoxygenation: Pre-oxygenation with 100% oxygen Intubation Type: IV induction Ventilation: Mask ventilation without difficulty and Oral airway inserted - appropriate to patient size Laryngoscope Size: Mac and 3 Grade View: Grade I Tube type: Oral Tube size: 7.0 mm Number of attempts: 1 Airway Equipment and Method: Stylet Secured at: 22 cm Tube secured with: Tape Dental Injury: Teeth and Oropharynx as per pre-operative assessment     Anesthesia Regional Block:   Narrative:    Anesthesia Regional Block:  Adductor canal block  Pre-Anesthetic Checklist: ,, timeout performed, Correct Patient, Correct Site, Correct Laterality, Correct Procedure, Correct Position, site marked, Risks and benefits discussed, pre-op evaluation, post-op pain management  Laterality: Right  Prep: chloraprep       Needles:   Needle Type: Stimulator Needle - 80          Additional Needles:  Procedures: Doppler guided Adductor canal block Narrative:  Start time: 04/04/2014 9:05 AM End time: 04/04/2014 9:20 AM Injection made incrementally with aspirations every 5 mL.  Performed by: Personally  Anesthesiologist: Dr. Oletta Lamas

## 2014-04-04 NOTE — Anesthesia Postprocedure Evaluation (Signed)
  Anesthesia Post-op Note  Patient: [name]  Procedure(s) Performed: Procedure(s): RIGHT TOTAL KNEE ARTHROPLASTY (Right)  Patient Location: PACU  Anesthesia Type:General  Level of Consciousness: awake  Airway and Oxygen Therapy: Patient Spontanous Breathing  Post-op Pain: mild  Post-op Assessment: Post-op Vital signs reviewed  Post-op Vital Signs: Reviewed  Last Vitals:  Filed Vitals:   04/04/14 1230  BP:   Pulse: 79  Temp:   Resp: 16    Complications: No apparent anesthesia complications

## 2014-04-04 NOTE — Anesthesia Preprocedure Evaluation (Addendum)
Anesthesia Evaluation  Patient identified by MRN, date of birth, ID band Patient awake    Reviewed: Allergy & Precautions, H&P , NPO status , Patient's Chart, lab work & pertinent test results  History of Anesthesia Complications (+) PONV  Airway Mallampati: II      Dental   Pulmonary pneumonia -,  breath sounds clear to auscultation        Cardiovascular hypertension, Rate:Normal     Neuro/Psych    GI/Hepatic negative GI ROS, Neg liver ROS,   Endo/Other  Hypothyroidism   Renal/GU negative Renal ROS     Musculoskeletal  (+) Arthritis -,   Abdominal   Peds  Hematology   Anesthesia Other Findings   Reproductive/Obstetrics                          Anesthesia Physical Anesthesia Plan  ASA: III  Anesthesia Plan: General   Post-op Pain Management:    Induction: Intravenous  Airway Management Planned: Oral ETT  Additional Equipment:   Intra-op Plan:   Post-operative Plan: Extubation in OR  Informed Consent: I have reviewed the patients History and Physical, chart, labs and discussed the procedure including the risks, benefits and alternatives for the proposed anesthesia with the patient or authorized representative who has indicated his/her understanding and acceptance.     Plan Discussed with: CRNA and Anesthesiologist  Anesthesia Plan Comments:         Anesthesia Quick Evaluation

## 2014-04-04 NOTE — Interval H&P Note (Signed)
History and Physical Interval Note:  04/04/2014 9:53 AM  [name]  has presented today for surgery, with the diagnosis of djd right knee  The various methods of treatment have been discussed with the patient and family. After consideration of risks, benefits and other options for treatment, the patient has consented to  Procedure(s): RIGHT TOTAL KNEE ARTHROPLASTY (Right) as a surgical intervention .  The patient's history has been reviewed, patient examined, no change in status, stable for surgery.  I have reviewed the patient's chart and labs.  Questions were answered to the patient's satisfaction.     Elsie Saas A

## 2014-04-04 NOTE — Progress Notes (Signed)
Needs encouragement to take deep breaths

## 2014-04-04 NOTE — Addendum Note (Signed)
Addendum created 04/04/14 1449 by Eligha Bridegroom, CRNA   Modules edited: Anesthesia Medication Administration

## 2014-04-04 NOTE — Progress Notes (Signed)
Encouraged to deep breath/ needs stimulus to wake up

## 2014-04-04 NOTE — Transfer of Care (Signed)
Immediate Anesthesia Transfer of Care Note  Patient: [name]  Procedure(s) Performed: Procedure(s): RIGHT TOTAL KNEE ARTHROPLASTY (Right)  Patient Location: PACU  Anesthesia Type:GA combined with regional for post-op pain  Level of Consciousness: sedated and patient cooperative  Airway & Oxygen Therapy: Patient Spontanous Breathing and Patient connected to nasal cannula oxygen  Post-op Assessment: Report given to PACU RN and Post -op Vital signs reviewed and stable  Post vital signs: Reviewed and stable  Complications: No apparent anesthesia complications

## 2014-04-04 NOTE — Progress Notes (Signed)
Orthopedic Tech Progress Note Patient Details:  [name] 10/13/1940 [id number]  CPM Right Knee CPM Right Knee: On Right Knee Flexion (Degrees): 60 Right Knee Extension (Degrees): 0 Additional Comments: Trapeze bar and foot roll   Cammer, Theodoro Parma 04/04/2014, 2:08 PM

## 2014-04-04 NOTE — Progress Notes (Signed)
Pt placed her dentures in mouth

## 2014-04-04 NOTE — Progress Notes (Signed)
Verbally encourgaed to take deep breaths d/t diminished rr

## 2014-04-04 NOTE — Op Note (Signed)
MRN:     [id number] DOB/AGE:    1940-09-23 / 73 y.o.       OPERATIVE REPORT    DATE OF PROCEDURE:  04/04/2014       PREOPERATIVE DIAGNOSIS:   djd right knee      Estimated body mass index is 32.08 kg/(m^2) as calculated from the following:   Height as of this encounter: 5\' 4"  (1.626 m).   Weight as of this encounter: 84.823 kg (187 lb).                                                        POSTOPERATIVE DIAGNOSIS:   djd right knee                                                                      PROCEDURE:  Procedure(s): RIGHT TOTAL KNEE ARTHROPLASTY Using Depuy Sigma RP implants #2 Femur, #2Tibia, 12.68mm sigma RP bearing, 32 Patella     SURGEON: Krrish Freund A    ASSISTANT:  Kirstin Shepperson PA-C   (Present and scrubbed throughout the case, critical for assistance with exposure, retraction, instrumentation, and closure.)         ANESTHESIA: GET with Femoral Nerve Block  DRAINS: foley, 2 medium hemovac in knee   TOURNIQUET TIME: 55HRC   COMPLICATIONS:  None     SPECIMENS: None   INDICATIONS FOR PROCEDURE: The patient has  djd right knee, varus deformities, XR shows bone on bone arthritis. Patient has failed all conservative measures including anti-inflammatory medicines, narcotics, attempts at  exercise and weight loss, cortisone injections and viscosupplementation.  Risks and benefits of surgery have been discussed, questions answered.   DESCRIPTION OF PROCEDURE: The patient identified by armband, received  right femoral nerve block and IV antibiotics, in the holding area at [facility]. Patient taken to the operating room, appropriate anesthetic  monitors were attached General endotracheal anesthesia induced with  the patient in supine position, Foley catheter was inserted. Tourniquet  applied high to the operative thigh. Lateral post and foot positioner  applied to the table, the lower extremity was then prepped and draped  in usual sterile fashion from the  ankle to the tourniquet. Time-out procedure was performed. The limb was wrapped with an Esmarch bandage and the tourniquet inflated to 365 mmHg. We began the operation by making the anterior midline incision starting at handbreadth above the patella going over the patella 1 cm medial to and  4 cm distal to the tibial tubercle. Small bleeders in the skin and the  subcutaneous tissue identified and cauterized. Transverse retinaculum was incised and reflected medially and a medial parapatellar arthrotomy was accomplished. the patella was everted and theprepatellar fat pad resected. The superficial medial collateral  ligament was then elevated from anterior to posterior along the proximal  flare of the tibia and anterior half of the menisci resected. The knee was hyperflexed exposing bone on bone arthritis. Peripheral and notch osteophytes as well as the cruciate ligaments were then resected. We continued to  work our way around posteriorly along the proximal tibia, and  externally  rotated the tibia subluxing it out from underneath the femur. A McHale  retractor was placed through the notch and a lateral Hohmann retractor  placed, and we then drilled through the proximal tibia in line with the  axis of the tibia followed by an intramedullary guide rod and 2-degree  posterior slope cutting guide. The tibial cutting guide was pinned into place  allowing resection of 4 mm of bone medially and about 6 mm of bone  laterally because of her varus deformity. Satisfied with the tibial resection, we then  entered the distal femur 2 mm anterior to the PCL origin with the  intramedullary guide rod and applied the distal femoral cutting guide  set at 79mm, with 5 degrees of valgus. This was pinned along the  epicondylar axis. At this point, the distal femoral cut was accomplished without difficulty. We then sized for a #2 femoral component and pinned the guide in 3 degrees of external rotation.The chamfer cutting  guide was pinned into place. The anterior, posterior, and chamfer cuts were accomplished without difficulty followed by  the Sigma RP box cutting guide and the box cut. We also removed posterior osteophytes from the posterior femoral condyles. At this  time, the knee was brought into full extension. We checked our  extension and flexion gaps and found them symmetric at 12.48mm.  The patella thickness measured at 29 mm. We set the cutting guide at 11 and removed the posterior 8 mm  of the patella sized for 32 button and drilled the lollipop. The knee  was then once again hyperflexed exposing the proximal tibia. We sized for a #2 tibial base plate, applied the smokestack and the conical reamer followed by the the Delta fin keel punch. We then hammered into place the Sigma RP trial femoral component, inserted a 12.5-mm trial bearing, trial patellar button, and took the knee through range of motion from 0-130 degrees. No thumb pressure was required for patellar  tracking. At this point, all trial components were removed, a double batch of DePuy HV cement with 1500 mg of Tobra was mixed and applied to all bony metallic mating surfaces except for the posterior condyles of the femur itself. In order, we  hammered into place the tibial tray and removed excess cement, the femoral component and removed excess cement, a 12.5-mm Sigma RP bearing  was inserted, and the knee brought to full extension with compression.  The patellar button was clamped into place, and excess cement  removed. While the cement cured the wound was irrigated out with normal saline solution pulse lavage, and medium Hemovac drains were placed.. Ligament stability and patellar tracking were checked and found to be excellent. The tourniquet was then released and hemostasis was obtained with cautery. The parapatellar arthrotomy was closed with  #1 ethibond suture. The subcutaneous tissue with 0 and 2-0 undyed  Vicryl suture, and 4-0 Monocryl.. A  dressing of Xeroform,  4 x 4, dressing sponges, Webril, and Ace wrap applied. Needle and sponge count were correct times 2.The patient awakened, extubated, and taken to recovery room without difficulty. Vascular status was normal, pulses 2+ and symmetric.   Darryl Willner A 04/04/2014, 11:38 AM

## 2014-04-04 NOTE — Progress Notes (Signed)
Pt. Reports that she has been taking antibiotic for UTI, unsure of the name. Took last dose last p.m.

## 2014-04-05 ENCOUNTER — Encounter (HOSPITAL_COMMUNITY): Payer: Self-pay | Admitting: General Practice

## 2014-04-05 DIAGNOSIS — R112 Nausea with vomiting, unspecified: Secondary | ICD-10-CM | POA: Diagnosis not present

## 2014-04-05 DIAGNOSIS — Z9889 Other specified postprocedural states: Secondary | ICD-10-CM

## 2014-04-05 LAB — BASIC METABOLIC PANEL
Anion gap: 16 — ABNORMAL HIGH (ref 5–15)
BUN: 22 mg/dL (ref 6–23)
CO2: 20 mEq/L (ref 19–32)
CREATININE: 0.64 mg/dL (ref 0.50–1.10)
Calcium: 8.3 mg/dL — ABNORMAL LOW (ref 8.4–10.5)
Chloride: 99 mEq/L (ref 96–112)
GFR calc non Af Amer: 88 mL/min — ABNORMAL LOW (ref >90)
Glucose, Bld: 149 mg/dL — ABNORMAL HIGH (ref 70–99)
Potassium: 5.4 mEq/L — ABNORMAL HIGH (ref 3.7–5.3)
Sodium: 135 mEq/L — ABNORMAL LOW (ref 137–147)

## 2014-04-05 LAB — CBC
HCT: 38.1 % (ref 36.0–46.0)
Hemoglobin: 12.7 g/dL (ref 12.0–15.0)
MCH: 29.5 pg (ref 26.0–34.0)
MCHC: 33.3 g/dL (ref 30.0–36.0)
MCV: 88.4 fL (ref 78.0–100.0)
PLATELETS: 170 10*3/uL (ref 150–400)
RBC: 4.31 MIL/uL (ref 3.87–5.11)
RDW: 13.2 % (ref 11.5–15.5)
WBC: 14.8 10*3/uL — AB (ref 4.0–10.5)

## 2014-04-05 MED ORDER — ACETAMINOPHEN 650 MG RE SUPP
650.0000 mg | RECTAL | Status: DC
Start: 2014-04-05 — End: 2014-04-06
  Filled 2014-04-05 (×6): qty 1

## 2014-04-05 MED ORDER — SODIUM CHLORIDE 0.9 % IV SOLN
INTRAVENOUS | Status: DC
  Administered 2014-04-05 – 2014-04-06 (×2): via INTRAVENOUS

## 2014-04-05 MED ORDER — ACETAMINOPHEN 325 MG PO TABS
650.0000 mg | ORAL_TABLET | ORAL | Status: DC
  Administered 2014-04-05 – 2014-04-06 (×4): 650 mg via ORAL
  Filled 2014-04-05 (×5): qty 2

## 2014-04-05 MED ORDER — LORAZEPAM 0.5 MG PO TABS
0.5000 mg | ORAL_TABLET | Freq: Three times a day (TID) | ORAL | Status: DC | PRN
  Filled 2014-04-05: qty 1

## 2014-04-05 MED ORDER — HYDROMORPHONE HCL 2 MG PO TABS: 2.0000 mg | ORAL_TABLET | ORAL | Status: DC | PRN

## 2014-04-05 MED ORDER — DEXAMETHASONE SODIUM PHOSPHATE 10 MG/ML IJ SOLN
10.0000 mg | Freq: Three times a day (TID) | INTRAMUSCULAR | Status: DC
  Administered 2014-04-05 (×2): 10 mg via INTRAVENOUS
  Filled 2014-04-05 (×6): qty 1

## 2014-04-05 NOTE — Progress Notes (Signed)
04/05/14 Set up by MD office with [facility] for HHPT. Spoke with patient and her son, no change in d/c plan. Patient has a rolling walker and 3N1  at home and [facility] [place] has delivered a CPM to her home. Family will be available to assist post d/c. Confirmed with [facility] that patient is set up for HHPT.Fuller Plan RN, BSN, CCM

## 2014-04-05 NOTE — Progress Notes (Signed)
Physical Therapy Treatment Patient Details Name: [name] MRN: [id number] DOB: 23-Apr-1941 Today's Date: 04/05/2014    History of Present Illness 73 y.o. female s/p right total knee arthroplasty.     PT Comments    Pt continues to progress towards physical therapy goals, ambulating up to 115 feet at a supervision level while using a rolling walker. Tolerating all exercises well and motivated to improve functional mobility. Will follow up with patient in AM for gait and stair training. Patient will continue to benefit from skilled physical therapy services to further improve independence with functional mobility.   Follow Up Recommendations  Home health PT;Supervision for mobility/OOB     Equipment Recommendations  None recommended by PT    Recommendations for Other Services OT consult     Precautions / Restrictions Precautions Precautions: Knee Precaution Comments: Reviewed knee precautions Required Braces or Orthoses: Knee Immobilizer - Right Restrictions Weight Bearing Restrictions: Yes RLE Weight Bearing: Weight bearing as tolerated    Mobility  Bed Mobility Overal bed mobility: Modified Independent             General bed mobility comments: Pt up in recliner upon my arrival  Transfers Overall transfer level: Needs assistance Equipment used: Rolling walker (2 wheeled) Transfers: Sit to/from Stand Sit to Stand: Supervision         General transfer comment: Supervision for safety from lowest bed setting. Requires extra time and VC for hand placement. No physical assist required.  Ambulation/Gait Ambulation/Gait assistance: Supervision Ambulation Distance (Feet): 115 Feet Assistive device: Rolling walker (2 wheeled) Gait Pattern/deviations: Step-through pattern;Decreased step length - left;Decreased stance time - right;Antalgic;Trunk flexed   Gait velocity interpretation: Below normal speed for age/gender General Gait Details: VC for upright posture  intermittently with forward gaze. Focused on step-through gait pattern which is emerging nicely. VC for right knee extension in stance phase for quad activation (still quite limited terminal extension).  No buckling or loss of balance without knee immobilizer.   Stairs            Wheelchair Mobility    Modified Rankin (Stroke Patients Only)       Balance Overall balance assessment: Needs assistance Sitting-balance support: No upper extremity supported;Feet supported Sitting balance-Leahy Scale: Fair     Standing balance support: Single extremity supported Standing balance-Leahy Scale: Poor                      Cognition Arousal/Alertness: Awake/alert Behavior During Therapy: WFL for tasks assessed/performed Overall Cognitive Status: Within Functional Limits for tasks assessed                      Exercises Total Joint Exercises Ankle Circles/Pumps: AROM;Both;10 reps;Supine Quad Sets: AROM;Both;10 reps;Supine Short Arc Quad: AROM;Right;10 reps;Supine Heel Slides: AAROM;Right;10 reps;Supine Hip ABduction/ADduction: AROM;Right;10 reps;Supine Straight Leg Raises: AAROM;Right;10 reps;Supine Goniometric ROM: To be assessed in AM    General Comments        Pertinent Vitals/Pain Pain Assessment: 0-10 Pain Score:  ("pain is okay, still a little sick") Pain Location: Rt knee Pain Descriptors / Indicators: Aching Pain Intervention(s): Monitored during session;Repositioned    Home Living Family/patient expects to be discharged to:: Private residence Living Arrangements: Spouse/significant other (and son says he can stay for a week if need be) Available Help at Discharge:  (son says he can stay for week if need be) Type of Home: Mobile home Home Access: Stairs to enter Entrance Stairs-Rails: Right;Left Home Layout: One level  Home Equipment: [place] - 2 wheels;Walker - 4 wheels;Cane - single point;Cane - quad;Bedside commode;Shower seat;Other (comment)       Prior Function Level of Independence: Independent with assistive device(s)      Comments: Used cane/RW intermittently for ambulation - could bath/dress herself   PT Goals (current goals can now be found in the care plan section) Acute Rehab PT Goals Patient Stated Goal: hoping for home PT Goal Formulation: With patient Time For Goal Achievement: 04/12/14 Potential to Achieve Goals: Good Progress towards PT goals: Progressing toward goals    Frequency  7X/week    PT Plan Current plan remains appropriate    Co-evaluation             End of Session   Activity Tolerance: Patient tolerated treatment well Patient left: with call bell/phone within reach;with family/visitor present;in bed;in CPM     Time: 1459-1521 PT Time Calculation (min): 22 min  Charges:  $Therapeutic Exercise: 8-22 mins                    G Codes:      [name], [address]  [name] 04/05/2014, 3:43 PM

## 2014-04-05 NOTE — Evaluation (Signed)
Occupational Therapy Evaluation Patient Details Name: [name] MRN: [id number] DOB: 04/03/1941 Today's Date: 04/05/2014    History of Present Illness 73 y.o. female s/p right total knee arthroplasty.    Clinical Impression   This 73 yo female admitted and underwent above presents to acute OT with decreased ROM in RLE, decreased lethargy, increased pain, decreased balance, and decreased mobility all affecting the patient's ability to care for herself as she did pta at an Independent level. She will benefit from acute OT with follow up [place] (as long as her lethargy clears, otherwise she may need ST SNF) to get her to an Mod I/ S level    Follow Up Recommendations  Home health OT    Equipment Recommendations  None recommended by OT       Precautions / Restrictions Precautions Precautions: Knee Precaution Comments: Reviewed knee precautions Required Braces or Orthoses: Knee Immobilizer - Right Restrictions Weight Bearing Restrictions: No RLE Weight Bearing: Weight bearing as tolerated      Mobility Bed Mobility               General bed mobility comments: Pt up in recliner upon my arrival  Transfers Overall transfer level: Needs assistance Equipment used: Rolling walker (2 wheeled) Transfers: Sit to/from Stand Sit to Stand: Min assist (VCs for safe hand placement)              Balance Overall balance assessment: Needs assistance Sitting-balance support: No upper extremity supported;Feet supported Sitting balance-Leahy Scale: Fair     Standing balance support: Single extremity supported Standing balance-Leahy Scale: Poor                              ADL Overall ADL's : Needs assistance/impaired Eating/Feeding: Independent;Sitting   Grooming: Set up;Sitting   Upper Body Bathing: Set up;Sitting   Lower Body Bathing: Moderate assistance (with min A sit<>stand)   Upper Body Dressing : Set up;Sitting   Lower Body Dressing: Moderate  assistance (with min A sit<>stand)   Toilet Transfer: Minimal assistance;Ambulation;RW;Comfort height toilet;Grab bars   Toileting- Clothing Manipulation and Hygiene: Min guard;Sit to/from stand                         Pertinent Vitals/Pain Pain Assessment: 0-10 Pain Score: 9  Pain Location: right knee Pain Descriptors / Indicators: Aching Pain Intervention(s): Monitored during session;Repositioned     Hand Dominance Right   Extremity/Trunk Assessment Upper Extremity Assessment Upper Extremity Assessment: Overall WFL for tasks assessed           Communication Communication Communication: No difficulties   Cognition Arousal/Alertness: Lethargic;Suspect due to medications Behavior During Therapy: [facility] for tasks assessed/performed Overall Cognitive Status: Within Functional Limits for tasks assessed                                Home Living Family/patient expects to be discharged to:: Private residence Living Arrangements: Spouse/significant other (and son says he can stay for a week if need be) Available Help at Discharge:  (son says he can stay for week if need be) Type of Home: Mobile home Home Access: Stairs to enter Entrance Stairs-Number of Steps: 3 Entrance Stairs-Rails: Right;Left Home Layout: One level     Bathroom Shower/Tub: Walk-in shower;Door   [employer] Toilet: Standard     Home Equipment: Environmental consultant - 2 wheels;Walker - 4  wheels;Cane - single point;Cane - quad;Bedside commode;Shower seat;Other (comment)          Prior Functioning/Environment Level of Independence: Independent with assistive device(s)        Comments: Used cane/RW intermittently for ambulation - could bath/dress herself    OT Diagnosis: Generalized weakness;Acute pain   OT Problem List: Decreased strength;Decreased activity tolerance;Pain;Decreased knowledge of use of DME or AE;Impaired balance (sitting and/or standing)   OT Treatment/Interventions:  Self-care/ADL training;Patient/family education;Balance training;DME and/or AE instruction    OT Goals(Current goals can be found in the care plan section) Acute Rehab OT Goals Patient Stated Goal: hoping for home OT Goal Formulation: With patient Time For Goal Achievement: 04/12/14 Potential to Achieve Goals: Good  OT Frequency: Min 2X/week              End of Session Equipment Utilized During Treatment: Gait belt;Rolling walker CPM Right Knee Additional Comments: foot roll placed while pt is in recliner Nurse Communication:  ( (SAT/HR monitor alarming due to HR in low 70's, nursing Minna Merritts) gave me the OK to set the lower limit for HR to 60))  Activity Tolerance: Patient limited by lethargy Patient left: in chair;with call bell/phone within reach;with family/visitor present   Time: 1205-1239 OT Time Calculation (min): 34 min Charges:  OT General Charges $OT Visit: 1 Procedure OT Evaluation $Initial OT Evaluation Tier I: 1 Procedure OT Treatments $Self Care/Home Management : 23-37 mins  Almon Register [phone] 04/05/2014, 1:00 PM

## 2014-04-05 NOTE — Progress Notes (Signed)
Subjective: 1 Day Post-Op Procedure(s) (LRB): RIGHT TOTAL KNEE ARTHROPLASTY (Right) Patient reports pain as 3 on 0-10 scale.  Patient very nauseated and incontinent.  She is laying in her own urine but did not realize it.  Objective: Vital signs in last 24 hours: Temp:  [97.2 F (36.2 C)-97.5 F (36.4 C)] 97.4 F (36.3 C) (09/17 0538) Pulse Rate:  [70-94] 72 (09/17 0538) Resp:  [10-25] 18 (09/17 0538) BP: (116-149)/(56-85) 120/68 mmHg (09/17 0538) SpO2:  [96 %-100 %] 99 % (09/17 0538)  Intake/Output from previous day: 09/16 0701 - 09/17 0700 In: 1736.7 [I.V.:1686.7; IV Piggyback:50] Out: 920 [Urine:800; Drains:120] Intake/Output this shift:     Recent Labs  04/05/14 0515  HGB 12.7    Recent Labs  04/05/14 0515  WBC 14.8*  RBC 4.31  HCT 38.1  PLT 170    Recent Labs  04/05/14 0515  NA 135*  K 5.4*  CL 99  CO2 20  BUN 22  CREATININE 0.64  GLUCOSE 149*  CALCIUM 8.3*   No results found for this basename: LABPT, INR,  in the last 72 hours  ABD soft Neurovascular intact Sensation intact distally Intact pulses distally Dorsiflexion/Plantar flexion intact Incision: scant drainage  Assessment/Plan: 1 Day Post-Op Procedure(s) (LRB): RIGHT TOTAL KNEE ARTHROPLASTY (Right) Principal Problem:   Primary localized osteoarthritis of right knee Active Problems:   PONV (postoperative nausea and vomiting)   Complication of anesthesia   Hypertension   Hypothyroidism   Recurrent urinary tract infection   DJD (degenerative joint disease) of knee   Post-operative nausea and vomiting  Up with therapy Tylenol scheduled for pain.  Limit narcotics until post op nausea and vomiting have resolved.  Change IV fluids to NS as her potassium is elevated. Will consult social work as husband is very unsteady on his feet and a fall risk.  Encourage mobility.  Gurjit Loconte J 04/05/2014, 10:05 AM

## 2014-04-05 NOTE — Evaluation (Signed)
Physical Therapy Evaluation Patient Details Name: [name] MRN: [id number] DOB: September 07, 1940 Today's Date: 04/05/2014   History of Present Illness  73 y.o. female s/p right total knee arthroplasty.   Clinical Impression  Pt is s/p right TKA resulting in the deficits listed below (see PT Problem List). Ambulates slowly up to 75 feet with supervision for safety while using a rolling walker. Pt reports nausea however no emesis. Anticipate she will progress well towards PT goals and is motivated to return home. Although her support is limited due to her roommate's mobility status, I feel she will progress to a supervision-Mod I level quickly, allowing her to mobilizes without physical assistance at home. Pt will benefit from skilled PT to increase their independence and safety with mobility to allow discharge to the venue listed below.     Follow Up Recommendations Home health PT;Supervision for mobility/OOB    Equipment Recommendations  None recommended by PT    Recommendations for Other Services OT consult     Precautions / Restrictions Precautions Precautions: Knee Precaution Comments: Reviewed knee precautions Required Braces or Orthoses: Knee Immobilizer - Right Restrictions Weight Bearing Restrictions: No RLE Weight Bearing: Weight bearing as tolerated      Mobility  Bed Mobility               General bed mobility comments: Pt up in recliner upon my arrival  Transfers Overall transfer level: Needs assistance Equipment used: Rolling walker (2 wheeled) Transfers: Sit to/from Stand Sit to Stand: Min assist (VCs for safe hand placement)         General transfer comment: Min guard for safety with VC for technique and hand placement. Good stability upon standing with RW. Encouraged to increase WB through RLE  Ambulation/Gait Ambulation/Gait assistance: Supervision Ambulation Distance (Feet): 75 Feet Assistive device: Rolling walker (2 wheeled) Gait  Pattern/deviations: Step-to pattern;Decreased step length - left;Decreased stance time - right;Antalgic;Trunk flexed   Gait velocity interpretation: Below normal speed for age/gender General Gait Details: Slow and guarded gait. Educated on safe DME use. VC for upright posture and to extend Rt knee in stance phase for quad activation. No instances of buckling noted.  Stairs            Wheelchair Mobility    Modified Rankin (Stroke Patients Only)       Balance Overall balance assessment: Needs assistance Sitting-balance support: No upper extremity supported;Feet supported Sitting balance-Leahy Scale: Fair     Standing balance support: Single extremity supported Standing balance-Leahy Scale: Poor                               Pertinent Vitals/Pain Pain Assessment: 0-10 Pain Score: 9  Pain Location: right knee Pain Descriptors / Indicators: Aching Pain Intervention(s): Monitored during session;Repositioned    Home Living Family/patient expects to be discharged to:: Private residence Living Arrangements: Spouse/significant other (and son says he can stay for a week if need be) Available Help at Discharge:  (son says he can stay for week if need be) Type of Home: Mobile home Home Access: Stairs to enter Entrance Stairs-Rails: Psychiatric nurse of Steps: 3 Home Layout: One level Home Equipment: Walker - 2 wheels;Walker - 4 wheels;Cane - single point;Cane - quad;Bedside commode;Shower seat;Other (comment)      Prior Function Level of Independence: Independent with assistive device(s)         Comments: Used cane/RW intermittently for ambulation - could bath/dress herself  Hand Dominance   Dominant Hand: Right    Extremity/Trunk Assessment   Upper Extremity Assessment: Overall WFL for tasks assessed           Lower Extremity Assessment: RLE deficits/detail RLE Deficits / Details: decreased strength and ROM as expected post  op       Communication   Communication: No difficulties  Cognition Arousal/Alertness: Lethargic;Suspect due to medications Behavior During Therapy: [facility] for tasks assessed/performed Overall Cognitive Status: Within Functional Limits for tasks assessed                      General Comments General comments (skin integrity, edema, etc.): Reports nausea, negative for emesis during therapy.    Exercises Total Joint Exercises Ankle Circles/Pumps: AROM;Both;10 reps;Seated Quad Sets: AROM;Both;10 reps;Seated Long Arc Quad: AAROM;Right;10 reps;Seated Knee Flexion: AAROM;Right;10 reps;Seated      Assessment/Plan    PT Assessment Patient needs continued PT services  PT Diagnosis Difficulty walking;Abnormality of gait;Acute pain   PT Problem List Decreased strength;Decreased activity tolerance;Decreased range of motion;Decreased balance;Decreased mobility;Decreased knowledge of use of DME;Decreased knowledge of precautions;Pain  PT Treatment Interventions DME instruction;Gait training;Functional mobility training;Therapeutic activities;Therapeutic exercise;Stair training;Balance training;Neuromuscular re-education;Patient/family education;Modalities   PT Goals (Current goals can be found in the Care Plan section) Acute Rehab PT Goals Patient Stated Goal: hoping for home PT Goal Formulation: With patient Time For Goal Achievement: 04/12/14 Potential to Achieve Goals: Good    Frequency 7X/week   Barriers to discharge Decreased caregiver support Rommate with limited assistance for pt due to disability.    Co-evaluation               End of Session   Activity Tolerance: Patient tolerated treatment well Patient left: in chair;with call bell/phone within reach;with family/visitor present Nurse Communication: Mobility status         Time: 7078-6754 PT Time Calculation (min): 35 min   Charges:   PT Evaluation $Initial PT Evaluation Tier I: 1 Procedure PT  Treatments $Gait Training: 8-22 mins $Therapeutic Exercise: 8-22 mins   PT G Codes:         [name], [place]  [name] 04/05/2014, 1:11 PM

## 2014-04-05 NOTE — Progress Notes (Signed)
Utilization review completed.  

## 2014-04-05 NOTE — Progress Notes (Signed)
Offered to have patient dangle, patient refused at this time.  Pain and nausea have been an issue tonight.  Will continue to monitor.

## 2014-04-06 DIAGNOSIS — T801XXA Vascular complications following infusion, transfusion and therapeutic injection, initial encounter: Secondary | ICD-10-CM | POA: Diagnosis not present

## 2014-04-06 LAB — CBC
HCT: 32.3 % — ABNORMAL LOW (ref 36.0–46.0)
Hemoglobin: 10.8 g/dL — ABNORMAL LOW (ref 12.0–15.0)
MCH: 29.8 pg (ref 26.0–34.0)
MCHC: 33.4 g/dL (ref 30.0–36.0)
MCV: 89 fL (ref 78.0–100.0)
PLATELETS: 173 10*3/uL (ref 150–400)
RBC: 3.63 MIL/uL — ABNORMAL LOW (ref 3.87–5.11)
RDW: 13.7 % (ref 11.5–15.5)
WBC: 13.1 10*3/uL — ABNORMAL HIGH (ref 4.0–10.5)

## 2014-04-06 LAB — BASIC METABOLIC PANEL
Anion gap: 10 (ref 5–15)
BUN: 21 mg/dL (ref 6–23)
CO2: 23 mEq/L (ref 19–32)
Calcium: 8.5 mg/dL (ref 8.4–10.5)
Chloride: 108 mEq/L (ref 96–112)
Creatinine, Ser: 0.66 mg/dL (ref 0.50–1.10)
GFR calc Af Amer: >90 mL/min (ref >90)
GFR calc non Af Amer: 87 mL/min — ABNORMAL LOW (ref >90)
Glucose, Bld: 148 mg/dL — ABNORMAL HIGH (ref 70–99)
POTASSIUM: 4.6 meq/L (ref 3.7–5.3)
Sodium: 141 mEq/L (ref 137–147)

## 2014-04-06 MED ORDER — HYDROMORPHONE HCL 2 MG PO TABS: 2.0000 mg | ORAL_TABLET | ORAL | Status: DC | PRN

## 2014-04-06 MED ORDER — POLYETHYLENE GLYCOL 3350 17 G PO PACK
PACK | ORAL | Status: DC
Start: 2014-04-06 — End: 2015-05-01

## 2014-04-06 MED ORDER — ACETAMINOPHEN 325 MG PO TABS: 650.0000 mg | ORAL_TABLET | ORAL | Status: DC | PRN

## 2014-04-06 MED ORDER — CEFUROXIME AXETIL 500 MG PO TABS: 500.0000 mg | ORAL_TABLET | Freq: Two times a day (BID) | ORAL | Status: DC

## 2014-04-06 MED ORDER — ASPIRIN EC 325 MG PO TBEC: DELAYED_RELEASE_TABLET | ORAL | Status: DC

## 2014-04-06 MED ORDER — CELECOXIB 200 MG PO CAPS: ORAL_CAPSULE | ORAL | Status: DC

## 2014-04-06 NOTE — Progress Notes (Signed)
Physical Therapy Treatment Patient Details Name: [name] MRN: [id number] DOB: [birth year] Today's Date: 04/06/2014    History of Present Illness 73 y.o. female s/p right total knee arthroplasty.     PT Comments    Patient progressing well. Patient safe to D/C from a mobility standpoint based on progression towards goals set on PT eval.    Follow Up Recommendations  Home health PT;Supervision for mobility/OOB     Equipment Recommendations  None recommended by PT    Recommendations for Other Services       Precautions / Restrictions Precautions Precautions: Knee Precaution Comments: Reviewed knee precautions Required Braces or Orthoses: Knee Immobilizer - Right Restrictions Weight Bearing Restrictions: Yes RLE Weight Bearing: Weight bearing as tolerated    Mobility  Bed Mobility                  Transfers Overall transfer level: Modified independent                  Ambulation/Gait Ambulation/Gait assistance: Supervision Ambulation Distance (Feet): 300 Feet Assistive device: Rolling walker (2 wheeled) Gait Pattern/deviations: Step-through pattern;Decreased stride length   Gait velocity interpretation: Below normal speed for age/gender General Gait Details: Cues for upright posture and to look forward.    Stairs Stairs: Yes Stairs assistance: Min guard Stair Management: Step to pattern;Forwards;Two rails Number of Stairs: 5 General stair comments: Cues for sequency and technique  Wheelchair Mobility    Modified Rankin (Stroke Patients Only)       Balance                                    Cognition Arousal/Alertness: Awake/alert Behavior During Therapy: WFL for tasks assessed/performed Overall Cognitive Status: Within Functional Limits for tasks assessed                      Exercises Total Joint Exercises Quad Sets: AROM;Both;10 reps;Supine Heel Slides: AAROM;Right;10 reps;Supine Hip  ABduction/ADduction: AROM;Right;10 reps;Supine Straight Leg Raises: AAROM;Right;10 reps;Supine Long Arc Quad: Right;10 reps;Seated;AROM    General Comments        Pertinent Vitals/Pain Pain Assessment: No/denies pain    Home Living                      Prior Function            PT Goals (current goals can now be found in the care plan section) Progress towards PT goals: Progressing toward goals    Frequency  7X/week    PT Plan Current plan remains appropriate    Co-evaluation             End of Session Equipment Utilized During Treatment: Gait belt Activity Tolerance: Patient tolerated treatment well Patient left: in chair;with call bell/phone within reach;with family/visitor present     Time: 2878-6767 PT Time Calculation (min): 27 min  Charges:  $Gait Training: 8-22 mins $Therapeutic Exercise: 8-22 mins                    G Codes:      Jacqualyn Posey 04/06/2014, 10:34 AM 04/06/2014 Jacqualyn Posey PTA [phone] pager [phone] office

## 2014-04-06 NOTE — Discharge Summary (Signed)
Patient ID: [name] MRN: [id number] DOB/AGE: 03/02/1941 73 y.o.  Admit date: 04/04/2014 Discharge date: 04/06/2014  Admission Diagnoses:  Principal Problem:   Primary localized osteoarthritis of right knee Active Problems:   PONV (postoperative nausea and vomiting)   Complication of anesthesia   Hypertension   Hypothyroidism   Recurrent urinary tract infection   DJD (degenerative joint disease) of knee   Post-operative nausea and vomiting   IV infiltration   Discharge Diagnoses:  Same  Past Medical History  Diagnosis Date  . Complication of anesthesia   . PONV (postoperative nausea and vomiting)   . Primary localized osteoarthritis of right knee   . Hypertension     takes Lisinopril daily  . Hypothyroidism     takes Synthroid daily  . Constipation     takes Colace daily and Milk of Mag  . Hyperlipidemia     was on meds but stopped taking back in June 2015  . Pneumonia     as a baby  . Dizziness     but goes away real quick  . Joint pain   . Back pain     comepensating for knee pain per pt  . History of colon polyps   . Urinary frequency   . Urinary urgency   . Cataract     right eye and immature  . Dry eyes     uses Eye Drops daily as needed    Surgeries: Procedure(s): RIGHT TOTAL KNEE ARTHROPLASTY on 04/04/2014   Consultants:    Discharged Condition: Improved  Hospital Course: [name] is an 73 y.o. female who was admitted 04/04/2014 for operative treatment ofPrimary localized osteoarthritis of right knee. Patient has severe unremitting pain that affects sleep, daily activities, and work/hobbies. After pre-op clearance the patient was taken to the operating room on 04/04/2014 and underwent  Procedure(s): RIGHT TOTAL KNEE ARTHROPLASTY.    Patient was given perioperative antibiotics:     Anti-infectives   Start     Dose/Rate Route Frequency Ordered Stop   04/06/14 0000  cefUROXime (CEFTIN) 500 MG tablet     500 mg Oral 2 times daily with  meals 04/06/14 0734     04/04/14 1700  clindamycin (CLEOCIN) IVPB 600 mg     600 mg 100 mL/hr over 30 Minutes Intravenous Every 6 hours 04/04/14 1524 04/04/14 2241   04/04/14 1112  tobramycin (NEBCIN) powder  Status:  Discontinued       As needed 04/04/14 1112 04/04/14 1217   04/04/14 0600  vancomycin (VANCOCIN) IVPB 1000 mg/200 mL premix     1,000 mg 200 mL/hr over 60 Minutes Intravenous On call to O.R. 04/03/14 1427 04/04/14 1030       Patient was given sequential compression devices, early ambulation, and chemoprophylaxis to prevent DVT.  The IV in her left arm infiltrated.  I am sending her home on ceftin 500 mg BID for a week to prevent cellulitis due to the infiltrated IV  Patient benefited maximally from hospital stay and there were no complications.    Recent vital signs:  Patient Vitals for the past 24 hrs:  BP Temp Temp src Pulse Resp SpO2  04/06/14 0443 146/70 mmHg 98.2 F (36.8 C) Oral 100 16 97 %  04/06/14 0400 - - - - 16 -  04/06/14 0000 - - - - 16 -  04/05/14 2057 140/48 mmHg 98.4 F (36.9 C) Oral 99 18 95 %  04/05/14 2000 - - - - 16 -  04/05/14  1520 128/62 mmHg 98.7 F (37.1 C) - 93 18 98 %     Recent laboratory studies:   Recent Labs  04/05/14 0515 04/06/14 0537  WBC 14.8* 13.1*  HGB 12.7 10.8*  HCT 38.1 32.3*  PLT 170 173  NA 135* 141  K 5.4* 4.6  CL 99 108  CO2 20 23  BUN 22 21  CREATININE 0.64 0.66  GLUCOSE 149* 148*  CALCIUM 8.3* 8.5     Discharge Medications:     Medication List         acetaminophen 325 MG tablet  Commonly known as:  TYLENOL  Take 2 tablets (650 mg total) by mouth every 4 (four) hours as needed for moderate pain.     aspirin EC 325 MG tablet  1 tab a day for the next 30 days to prevent blood clots     carboxymethylcellulose 1 % ophthalmic solution  Place 1 drop into both eyes daily as needed (dry eyes).     cefUROXime 500 MG tablet  Commonly known as:  CEFTIN  Take 1 tablet (500 mg total) by mouth 2 (two)  times daily with a meal.     celecoxib 200 MG capsule  Commonly known as:  CELEBREX  1 tab po q day with food for pain and  swelling     docusate sodium 100 MG capsule  Commonly known as:  COLACE  Take 100 mg by mouth 2 (two) times daily.     HYDROmorphone 2 MG tablet  Commonly known as:  DILAUDID  Take 1 tablet (2 mg total) by mouth every 3 (three) hours as needed for severe pain.     levothyroxine 75 MCG tablet  Commonly known as:  SYNTHROID, LEVOTHROID  Take 75 mcg by mouth daily before breakfast.     lisinopril 10 MG tablet  Commonly known as:  PRINIVIL,ZESTRIL  Take 10 mg by mouth at bedtime.     magnesium hydroxide 400 MG/5ML suspension  Commonly known as:  MILK OF MAGNESIA  Take 60 mLs by mouth daily as needed for mild constipation.     polyethylene glycol packet  Commonly known as:  MIRALAX / GLYCOLAX  1 pack in 4-8 ounces of water two times a day until a BM        Diagnostic Studies: Dg Chest 2 View  03/23/2014   CLINICAL DATA:  Right knee arthroplasty.  EXAM: CHEST  2 VIEW  COMPARISON:  Two-view chest 05/02/04  FINDINGS: The heart size and mediastinal contours are within normal limits. Both lungs are clear. The visualized skeletal structures are unremarkable.  IMPRESSION: Negative two view chest x-ray.   Electronically Signed   By: Lawrence Santiago M.D.   On: 03/23/2014 10:55    Disposition: 01-Home or Self Care  Discharge Instructions   CPM    Complete by:  As directed   Continuous passive motion machine (CPM):      Use the CPM from 0 to 90 for 6 hours per day.       You may break it up into 2 or 3 sessions per day.      Use CPM for 2 weeks or until you are told to stop.     Call MD / Call 911    Complete by:  As directed   If you experience chest pain or shortness of breath, CALL 911 and be transported to the hospital emergency room.  If you develope a fever above 101 F, pus ([place] drainage) or  increased drainage or redness at the wound, or calf pain, call your  surgeon's office.     Change dressing    Complete by:  As directed   Do not remove dressing over the wound.  Wash the whole leg including over this dressing every day     Constipation Prevention    Complete by:  As directed   Drink plenty of fluids.  Prune juice may be helpful.  You may use a stool softener, such as Colace (over the counter) 100 mg twice a day.  Use MiraLax (over the counter) for constipation as needed.     Diet - low sodium heart healthy    Complete by:  As directed      Discharge instructions    Complete by:  As directed   Total Knee Replacement Care After Refer to this sheet in the next few weeks. These discharge instructions provide you with general information on caring for yourself after you leave the hospital. Your caregiver may also give you specific instructions. Your treatment has been planned according to the most current medical practices available, but unavoidable complications sometimes occur. If you have any problems or questions after discharge, please call your caregiver. Regaining a near full range of motion of your knee within the first 3 to 6 weeks after surgery is critical. [place] may resume a normal diet and activities as directed.  Perform exercises as directed.  Place gray foam block, curve side up under heel at all times except when in CPM or when walking.  DO NOT modify, tear, cut, or change in any way the gray foam block. You will receive physical therapy daily  Take showers instead of baths until informed otherwise.  You may shower on Sunday.  Please wash whole leg including wound with soap and water  Do not remove the bandage over the wound.  Wash you leg over this dressing every day It is OK to take over-the-counter tylenol in addition to the oxycodone for pain, discomfort, or fever. Oxycodone is VERY constipating.  Please take stool softener twice a day and laxatives daily until bowels are regular Eat a well-balanced diet.   Avoid lifting or driving until you are instructed otherwise.  Make an appointment to see your caregiver for stitches (suture) or staple removal as directed.  If you have been sent home with a continuous passive motion machine (CPM machine), 0-90 degrees 6 hrs a day   2 hrs a shift SEEK MEDICAL CARE IF: You have swelling of your calf or leg.  You develop shortness of breath or chest pain.  You have redness, swelling, or increasing pain in the wound.  There is pus or any unusual drainage coming from the surgical site.  You notice a bad smell coming from the surgical site or dressing.  The surgical site breaks open after sutures or staples have been removed.  There is persistent bleeding from the suture or staple line.  You are getting worse or are not improving.  You have any other questions or concerns.  SEEK IMMEDIATE MEDICAL CARE IF:  You have a fever.  You develop a rash.  You have difficulty breathing.  You develop any reaction or side effects to medicines given.  Your knee motion is decreasing rather than improving.  MAKE SURE YOU:  Understand these instructions.  Will watch your condition.  Will get help right away if you are not doing well or get worse.     Do  not put a pillow under the knee. Place it under the heel.    Complete by:  As directed   Place gray foam block, curve side up under heel at all times except when in CPM or when walking.  DO NOT modify, tear, cut, or change in any way the gray foam block.     Increase activity slowly as tolerated    Complete by:  As directed      TED hose    Complete by:  As directed   Use stockings (TED hose) for 2 weeks on both leg(s).  You may remove them at night for sleeping.           Follow-up Information   Follow up with [facility]. (They will call you to schedule home physical therapy visits.)    Contact information:   [address] [place]  [address] [phone]       Follow up with  Lorn Junes, MD On 04/17/2014. (appt time 3:20 in [facility])    Specialty:  Orthopedic Surgery   Contact information:   [address] [place] [address] [phone]        Signed: Tashauna Hedges 04/06/2014, 7:35 AM

## 2014-04-06 NOTE — Progress Notes (Signed)
Occupational Therapy Treatment and Discharge Patient Details Name: PRESCILLA MONGER MRN: [id number] DOB: [birth year] Today's Date: 04/06/2014    History of present illness 73 y.o. female s/p right total knee arthroplasty.    OT comments  This patient presents to acute OT with all education completed and goals met, we will D/C from acute OT.  Follow Up Recommendations  Home health OT    Equipment Recommendations  None recommended by OT       Precautions / Restrictions Precautions Precautions: Knee Precaution Comments: Reviewed knee precautions Required Braces or Orthoses: Knee Immobilizer - Right Restrictions Weight Bearing Restrictions: No RLE Weight Bearing: Weight bearing as tolerated       Mobility Bed Mobility               General bed mobility comments: Pt coming out of bathroom as I entered room  Transfers Overall transfer level: Modified independent                        ADL Overall ADL's : Needs assistance/impaired                     Lower Body Dressing: Modified independent;Sit to/from stand           Tub/ Banker: Walk-in shower;Modified independent;Ambulation;Rolling walker;3 in 1                      Cognition   Behavior During Therapy: WFL for tasks assessed/performed Overall Cognitive Status: Within Functional Limits for tasks assessed                                    Pertinent Vitals/ Pain       Pain Assessment: No/denies pain Pain Score: 0-No pain         Frequency Min 2X/week     Progress Toward Goals  OT Goals(current goals can now be found in the care plan section)  Progress towards OT goals: Goals met/education completed, patient discharged from [facility] Discharge plan remains appropriate       End of Session Equipment Utilized During Treatment: Rolling walker CPM Right Knee CPM Right Knee: Off   Activity Tolerance Patient tolerated treatment well   Patient Left  in chair;with call bell/phone within reach   Nurse Communication  (Pt ready to go from at therapy standpoint)        Time: 7939-0300 OT Time Calculation (min): 13 min  Charges: OT General Charges $OT Visit: 1 Procedure OT Treatments $Self Care/Home Management : 8-22 mins  Almon Register [phone] 04/06/2014, 12:49 PM

## 2014-04-06 NOTE — Progress Notes (Signed)
Discharge home. Home discharge instructions given with prescription. Patient verbalizes understanding. Home health needs addressed with case manager and patient is all set up with her HHN.

## 2014-06-27 ENCOUNTER — Other Ambulatory Visit: Payer: Self-pay | Admitting: Orthopedic Surgery

## 2014-06-27 DIAGNOSIS — M5416 Radiculopathy, lumbar region: Secondary | ICD-10-CM

## 2014-06-27 DIAGNOSIS — M5442 Lumbago with sciatica, left side: Secondary | ICD-10-CM

## 2014-07-05 ENCOUNTER — Ambulatory Visit
Admission: RE | Admit: 2014-07-05 | Discharge: 2014-07-05 | Disposition: A | Discharge status: Home / Self Care | Payer: Medicare Other | Source: Ambulatory Visit | Attending: Orthopedic Surgery | Admitting: Orthopedic Surgery

## 2014-07-05 ENCOUNTER — Other Ambulatory Visit: Payer: Medicare Other

## 2014-07-05 DIAGNOSIS — M5416 Radiculopathy, lumbar region: Secondary | ICD-10-CM

## 2014-07-05 DIAGNOSIS — M5442 Lumbago with sciatica, left side: Secondary | ICD-10-CM

## 2015-01-03 ENCOUNTER — Other Ambulatory Visit (HOSPITAL_COMMUNITY): Payer: Self-pay | Admitting: Family Medicine

## 2015-01-03 DIAGNOSIS — Z1389 Encounter for screening for other disorder: Secondary | ICD-10-CM

## 2015-01-03 DIAGNOSIS — Z1231 Encounter for screening mammogram for malignant neoplasm of breast: Secondary | ICD-10-CM

## 2015-01-14 ENCOUNTER — Ambulatory Visit (HOSPITAL_COMMUNITY)
Admission: RE | Admit: 2015-01-14 | Discharge: 2015-01-14 | Disposition: A | Discharge status: Home / Self Care | Payer: Medicare Other | Source: Ambulatory Visit | Attending: Family Medicine | Admitting: Family Medicine

## 2015-01-14 DIAGNOSIS — Z1231 Encounter for screening mammogram for malignant neoplasm of breast: Secondary | ICD-10-CM | POA: Diagnosis present

## 2015-01-14 DIAGNOSIS — Z780 Asymptomatic menopausal state: Secondary | ICD-10-CM | POA: Insufficient documentation

## 2015-01-14 DIAGNOSIS — Z1389 Encounter for screening for other disorder: Secondary | ICD-10-CM | POA: Diagnosis not present

## 2015-04-01 ENCOUNTER — Other Ambulatory Visit (HOSPITAL_COMMUNITY): Payer: Self-pay | Admitting: Family Medicine

## 2015-04-01 DIAGNOSIS — K5900 Constipation, unspecified: Secondary | ICD-10-CM

## 2015-04-01 DIAGNOSIS — R1032 Left lower quadrant pain: Secondary | ICD-10-CM

## 2015-04-04 ENCOUNTER — Other Ambulatory Visit (HOSPITAL_COMMUNITY): Payer: Self-pay | Admitting: Family Medicine

## 2015-04-04 DIAGNOSIS — R109 Unspecified abdominal pain: Secondary | ICD-10-CM

## 2015-04-04 DIAGNOSIS — R0989 Other specified symptoms and signs involving the circulatory and respiratory systems: Secondary | ICD-10-CM

## 2015-04-12 ENCOUNTER — Ambulatory Visit (HOSPITAL_COMMUNITY)
Admission: RE | Admit: 2015-04-12 | Discharge: 2015-04-12 | Disposition: A | Discharge status: Home / Self Care | Payer: Medicare Other | Source: Ambulatory Visit | Attending: Family Medicine | Admitting: Family Medicine

## 2015-04-12 DIAGNOSIS — R109 Unspecified abdominal pain: Secondary | ICD-10-CM

## 2015-04-12 DIAGNOSIS — R0989 Other specified symptoms and signs involving the circulatory and respiratory systems: Secondary | ICD-10-CM | POA: Insufficient documentation

## 2015-04-12 DIAGNOSIS — R1084 Generalized abdominal pain: Secondary | ICD-10-CM | POA: Diagnosis present

## 2015-04-17 ENCOUNTER — Encounter (HOSPITAL_COMMUNITY): Payer: Self-pay | Admitting: Physician Assistant

## 2015-04-17 DIAGNOSIS — M1712 Unilateral primary osteoarthritis, left knee: Secondary | ICD-10-CM

## 2015-04-17 HISTORY — DX: Unilateral primary osteoarthritis, left knee: M17.12

## 2015-04-17 NOTE — H&P (Signed)
TOTAL KNEE ADMISSION H&P  Patient is being admitted for left total knee arthroplasty.  Subjective:  Chief Complaint:left knee pain.  HPI: [name], 74 y.o. female, has a history of pain and functional disability in the left knee due to arthritis and has failed non-surgical conservative treatments for greater than 12 weeks to includeNSAID's and/or analgesics, corticosteriod injections and viscosupplementation injections.  Onset of symptoms was gradual, starting 10 years ago with gradually worsening course since that time. The patient noted no past surgery on the left knee(s).  Patient currently rates pain in the left knee(s) at 10 out of 10 with activity. Patient has night pain, worsening of pain with activity and weight bearing, pain that interferes with activities of daily living, crepitus and joint swelling.  Patient has evidence of subchondral sclerosis, joint subluxation and joint space narrowing by imaging studies. There is no active infection.  Patient Active Problem List   Diagnosis Date Noted  . Primary localized osteoarthritis of left knee 04/17/2015  . IV infiltration 04/06/2014  . Post-operative nausea and vomiting 04/05/2014  . DJD (degenerative joint disease) of knee 04/04/2014  . Primary localized osteoarthritis of right knee   . PONV (postoperative nausea and vomiting)   . Complication of anesthesia   . Hypertension   . Hypothyroidism   . Recurrent urinary tract infection   . Rectal bleeding 10/31/2013  . Change in bowel habits 10/31/2013  . FH: colon cancer 10/31/2013  . Perimenopausal vasomotor symptoms 11/17/2012   Past Medical History  Diagnosis Date  . Complication of anesthesia   . PONV (postoperative nausea and vomiting)   . Primary localized osteoarthritis of right knee   . Hypertension     takes Lisinopril daily  . Hypothyroidism     takes Synthroid daily  . Constipation     takes Colace daily and Milk of Mag  . Hyperlipidemia     was on meds but  stopped taking back in June 2015  . Pneumonia     as a baby  . Dizziness     but goes away real quick  . Joint pain   . Back pain     comepensating for knee pain per pt  . History of colon polyps   . Urinary frequency   . Urinary urgency   . Cataract     right eye and immature  . Dry eyes     uses Eye Drops daily as needed  . Primary localized osteoarthritis of left knee 04/17/2015    Past Surgical History  Procedure Laterality Date  . Rectocele repair  10/2007  . Colonoscopy  09/09/2004    [id number] rectum and colon  . Neck surgery  1983  . Colonoscopy N/A 11/08/2013    Procedure: COLONOSCOPY;  Surgeon: Daneil Dolin, MD;  Location: [facility];  Service: Endoscopy;  Laterality: N/A;  10:45  . Appendectomy  80's  . Abdominal hysterectomy  90's    complete  . Tubal ligation    . Total knee arthroplasty Right 04/04/2014    DR Noemi Chapel  . Total knee arthroplasty Right 04/04/2014    Procedure: RIGHT TOTAL KNEE ARTHROPLASTY;  Surgeon: Lorn Junes, MD;  Location: [place];  Service: Orthopedics;  Laterality: Right;    No prescriptions prior to admission   Allergies  Allergen Reactions  . Codeine Nausea And Vomiting  . Flexall [Menthol (Topical Analgesic)] Rash  . Penicillins Rash    Social History  Substance Use Topics  . Smoking status:  Never Smoker   . Smokeless tobacco: Never Used  . Alcohol Use: No    Family History  Problem Relation Age of Onset  . Colon cancer Sister     age 24s  . Bladder Cancer Brother   . Lung cancer Brother   . Myasthenia gravis Sister      Review of Systems  Constitutional: Negative.   HENT: Negative.   Eyes: Negative.   Respiratory: Negative.   Cardiovascular: Negative.   Gastrointestinal: Negative.   Genitourinary: Negative.   Musculoskeletal: Positive for joint pain.       Left knee pain  Skin: Negative.   Neurological: Negative.   Endo/Heme/Allergies: Negative.   Psychiatric/Behavioral: Negative.      Objective:  Physical Exam  Constitutional: She is oriented to person, place, and time. She appears well-developed and well-nourished.  HENT:  Head: Normocephalic and atraumatic.  Mouth/Throat: Oropharynx is clear and moist.  Eyes: Conjunctivae are normal. Pupils are equal, round, and reactive to light.  Neck: Neck supple.  Cardiovascular: Normal rate, regular rhythm and intact distal pulses.   Respiratory: Effort normal.  GI: Soft. Bowel sounds are normal.  Genitourinary:  Not pertinent to current symptomatology therefore not examined.  Musculoskeletal:  Examination of left knee reveals a moderate varus deformity.  There is 1+ synovitis. Diffuse pain. The range of motion is -5 to 110 degrees. The knee is stable with normal patellar tracking. Examination of her right knee reveals a well healed total knee incision without swelling or pain. Full range of motion. The knee is stable with normal patellar tracking. Vascular exam: pulses 2+ and symmetric.    Neurological: She is alert and oriented to person, place, and time.  Skin: Skin is dry.  Psychiatric: She has a normal mood and affect.    Vital signs in last 24 hours:    Labs:   Estimated body mass index is 32.15 kg/(m^2) as calculated from the following:   Height as of 03/23/14: 5\' 4"  (1.626 m).   Weight as of 03/23/14: 84.993 kg (187 lb 6 oz).   Imaging Review Plain radiographs demonstrate severe degenerative joint disease of the left knee(s). The overall alignment issignificant varus. The bone quality appears to be good for age and reported activity level.  Assessment/Plan:  End stage arthritis, left knee  Principal Problem:   Primary localized osteoarthritis of left knee Active Problems:   Change in bowel habits   PONV (postoperative nausea and vomiting)   Hypertension   Hypothyroidism   Recurrent urinary tract infection   The patient history, physical examination, clinical judgment of the provider and imaging  studies are consistent with end stage degenerative joint disease of the left knee(s) and total knee arthroplasty is deemed medically necessary. The treatment options including medical management, injection therapy arthroscopy and arthroplasty were discussed at length. The risks and benefits of total knee arthroplasty were presented and reviewed. The risks due to aseptic loosening, infection, stiffness, patella tracking problems, thromboembolic complications and other imponderables were discussed. The patient acknowledged the explanation, agreed to proceed with the plan and consent was signed. Patient is being admitted for inpatient treatment for surgery, pain control, PT, OT, prophylactic antibiotics, VTE prophylaxis, progressive ambulation and ADL's and discharge planning. The patient is planning to be discharged home with home health services

## 2015-04-18 ENCOUNTER — Encounter (HOSPITAL_COMMUNITY)
Admission: RE | Admit: 2015-04-18 | Discharge: 2015-04-18 | Disposition: A | Discharge status: Home / Self Care | Payer: Medicare Other | Source: Ambulatory Visit | Attending: Orthopedic Surgery | Admitting: Orthopedic Surgery

## 2015-04-18 ENCOUNTER — Encounter (HOSPITAL_COMMUNITY): Payer: Self-pay

## 2015-04-18 DIAGNOSIS — M179 Osteoarthritis of knee, unspecified: Secondary | ICD-10-CM | POA: Insufficient documentation

## 2015-04-18 DIAGNOSIS — Z01812 Encounter for preprocedural laboratory examination: Secondary | ICD-10-CM | POA: Diagnosis present

## 2015-04-18 DIAGNOSIS — Z0183 Encounter for blood typing: Secondary | ICD-10-CM | POA: Insufficient documentation

## 2015-04-18 LAB — COMPREHENSIVE METABOLIC PANEL
ALT: 12 U/L — ABNORMAL LOW (ref 14–54)
ANION GAP: 10 (ref 5–15)
AST: 20 U/L (ref 15–41)
Albumin: 4.1 g/dL (ref 3.5–5.0)
Alkaline Phosphatase: 74 U/L (ref 38–126)
BUN: 18 mg/dL (ref 6–20)
CHLORIDE: 107 mmol/L (ref 101–111)
CO2: 24 mmol/L (ref 22–32)
Calcium: 9.4 mg/dL (ref 8.9–10.3)
Creatinine, Ser: 0.78 mg/dL (ref 0.44–1.00)
Glucose, Bld: 95 mg/dL (ref 65–99)
POTASSIUM: 4.2 mmol/L (ref 3.5–5.1)
Sodium: 141 mmol/L (ref 135–145)
Total Bilirubin: 0.3 mg/dL (ref 0.3–1.2)
Total Protein: 7 g/dL (ref 6.5–8.1)

## 2015-04-18 LAB — CBC WITH DIFFERENTIAL/PLATELET
BASOS ABS: 0 10*3/uL (ref 0.0–0.1)
Basophils Relative: 0 %
EOS PCT: 1 %
Eosinophils Absolute: 0.1 10*3/uL (ref 0.0–0.7)
HCT: 42.8 % (ref 36.0–46.0)
Hemoglobin: 14.2 g/dL (ref 12.0–15.0)
LYMPHS PCT: 41 %
Lymphs Abs: 2.7 10*3/uL (ref 0.7–4.0)
MCH: 29.5 pg (ref 26.0–34.0)
MCHC: 33.2 g/dL (ref 30.0–36.0)
MCV: 88.8 fL (ref 78.0–100.0)
MONO ABS: 0.4 10*3/uL (ref 0.1–1.0)
Monocytes Relative: 6 %
Neutro Abs: 3.3 10*3/uL (ref 1.7–7.7)
Neutrophils Relative %: 52 %
PLATELETS: 200 10*3/uL (ref 150–400)
RBC: 4.82 MIL/uL (ref 3.87–5.11)
RDW: 13.7 % (ref 11.5–15.5)
WBC: 6.4 10*3/uL (ref 4.0–10.5)

## 2015-04-18 LAB — TYPE AND SCREEN
ABO/RH(D): A POS
ANTIBODY SCREEN: NEGATIVE

## 2015-04-18 LAB — SURGICAL PCR SCREEN
MRSA, PCR: NEGATIVE
Staphylococcus aureus: NEGATIVE

## 2015-04-18 LAB — APTT: APTT: 31 s (ref 24–37)

## 2015-04-18 LAB — PROTIME-INR
INR: 1 (ref 0.00–1.49)
PROTHROMBIN TIME: 13.4 s (ref 11.6–15.2)

## 2015-04-18 NOTE — Progress Notes (Signed)
EKG OV requested from Dr. Tonye Becket [facility] in [place].

## 2015-04-18 NOTE — Pre-Procedure Instructions (Signed)
    [name]  04/18/2015     No Pharmacies Listed   Your procedure is scheduled on 04-29-2015   Monday .  Report to [facility] Admitting at 5:30 A.M.   Call this number if you have problems the morning of surgery:  [phone]   Remember:  Do not eat food or drink liquids after midnight.   Take these medicines the morning of surgery with A SIP OF WATER levothyroxine(Synthroid)   Do not wear jewelry, make-up or nail polish.  Do not wear lotions, powders, or perfumes.  You may not wear deodorant.  Do not shave 48 hours prior to surgery.   .  Do not bring valuables to the hospital.  [facility] is not responsible for any belongings or valuables.  Contacts, dentures or bridgework may not be worn into surgery.  Leave your suitcase in the car.  After surgery it may be brought to your room.  For patients admitted to the hospital, discharge time will be determined by your treatment team.  Patients discharged the day of surgery will not be allowed to drive home.    Special instructions:  See Attached Sheet "Preparing for Surgery" for instructions on CHG shower  Please read over the following fact sheets that you were given. Pain Booklet, Coughing and Deep Breathing, Blood Transfusion Information and Surgical Site Infection Prevention

## 2015-04-19 ENCOUNTER — Encounter: Payer: Self-pay | Admitting: Cardiovascular Disease

## 2015-04-19 ENCOUNTER — Ambulatory Visit (INDEPENDENT_AMBULATORY_CARE_PROVIDER_SITE_OTHER): Payer: Medicare Other | Admitting: Cardiovascular Disease

## 2015-04-19 ENCOUNTER — Other Ambulatory Visit: Payer: Self-pay | Admitting: Cardiovascular Disease

## 2015-04-19 VITALS — BP 142/94 | HR 65 | Ht 64.0 in | Wt 173.0 lb

## 2015-04-19 DIAGNOSIS — Z01818 Encounter for other preprocedural examination: Secondary | ICD-10-CM | POA: Diagnosis not present

## 2015-04-19 DIAGNOSIS — I10 Essential (primary) hypertension: Secondary | ICD-10-CM

## 2015-04-19 DIAGNOSIS — E785 Hyperlipidemia, unspecified: Secondary | ICD-10-CM | POA: Diagnosis not present

## 2015-04-19 DIAGNOSIS — I739 Peripheral vascular disease, unspecified: Secondary | ICD-10-CM

## 2015-04-19 LAB — URINE CULTURE: Culture: 6000

## 2015-04-19 NOTE — Assessment & Plan Note (Signed)
History of hypertension with blood pressure measurements at 137/82 on lisinopril. Continue current meds at current dosing

## 2015-04-19 NOTE — Assessment & Plan Note (Signed)
History of hyperlipidemia followed by her PCP. She is currently not on a statin drug.

## 2015-04-19 NOTE — Patient Instructions (Signed)
Medication Instructions:  Your physician recommends that you continue on your current medications as directed. Please refer to the Current Medication list given to you today.   Labwork: none  Testing/Procedures: Your physician has requested that you have a lower extremity arterial doppler- During this test, ultrasound is used to evaluate arterial blood flow in the legs. Allow approximately one hour for this exam. SCHEDULE EARLY NEXT WEEK (10/3 OR 10/4)   Follow-Up: Follow up with Dr. Gwenlyn Found. as needed.   Any Other Special Instructions Will Be Listed Below (If Applicable).

## 2015-04-19 NOTE — Assessment & Plan Note (Signed)
[name] was referred for peripheral vascular evaluation. Because of an auscultated bruit in her groin by her PCP abdominal ultrasound was performed that showed mildly elevated velocities in her iliac arteries. The waveforms were apparently triphasic. She denies claudication. She does have 2+ pedal pulses. She is scheduled for a left total knee replacement in the upcoming future. I'm going to get formal lower extremity arterial Doppler studies but suspect these will be normal. This should not impact her orthopedic procedure.

## 2015-04-19 NOTE — Progress Notes (Signed)
04/19/2015 Aubrianne Molyneux [place]   1940-09-25  [id number]  Primary Physician Delman Cheadle, PA-C Primary Cardiologist: Lorretta Harp MD Renae Gloss   HPI:  [name] is a 74 year old mildly overweight widowed Caucasian female mother of 2 children, grandmother and 2 grandchildren referred by Dr. Noemi Chapel for peripheral vascular evaluation prior to elective left total hip replacement scheduled for October 10. Her cardiovascular risk factor profile is notable only for treated hypertension and untreated mild hyperlipidemia. She has never had a heart attack or stroke and denies chest pain or shortness of breath. She has never smoked. She denies claudication. She had an uncomplicated right total knee replacement in September last year. Because when auscultated bruit in her groin by her PCP she underwent abdominal ultrasound that showed mildly elevated velocities in her iliac arteries with triphasic waveforms.   Current Outpatient Prescriptions  Medication Sig Dispense Refill  . aspirin EC 81 MG tablet Take 81 mg by mouth daily.    Marland Kitchen levothyroxine (SYNTHROID, LEVOTHROID) 75 MCG tablet Take 75 mcg by mouth daily before breakfast.    . lisinopril (PRINIVIL,ZESTRIL) 10 MG tablet Take 10 mg by mouth at bedtime.     . magnesium hydroxide (MILK OF MAGNESIA) 400 MG/5ML suspension Take 60 mLs by mouth daily as needed for mild constipation.    . Omega-3 Fatty Acids (OMEGA-3 FISH OIL PO) Take 1 tablet by mouth daily.    . polyethylene glycol (MIRALAX / GLYCOLAX) packet 1 pack in 4-8 ounces of water two times a day until a BM 60 each 0   No current facility-administered medications for this visit.    Allergies  Allergen Reactions  . Codeine Nausea And Vomiting  . Flexall [Menthol (Topical Analgesic)] Rash  . Penicillins Rash    Social History   Social History  . Marital Status: Widowed    Spouse Name: N/A  . Number of Children: N/A  . Years of Education: N/A   Occupational History   . Not on file.   Social History Main Topics  . Smoking status: Never Smoker   . Smokeless tobacco: Never Used  . Alcohol Use: No  . Drug Use: No  . Sexual Activity: No   Other Topics Concern  . Not on file   Social History Narrative     Review of Systems: General: negative for chills, fever, night sweats or weight changes.  Cardiovascular: negative for chest pain, dyspnea on exertion, edema, orthopnea, palpitations, paroxysmal nocturnal dyspnea or shortness of breath Dermatological: negative for rash Respiratory: negative for cough or wheezing Urologic: negative for hematuria Abdominal: negative for nausea, vomiting, diarrhea, bright red blood per rectum, melena, or hematemesis Neurologic: negative for visual changes, syncope, or dizziness All other systems reviewed and are otherwise negative except as noted above.    Blood pressure 142/94, pulse 65, height 5\' 4"  (1.626 m), weight 173 lb (78.472 kg).  General appearance: alert and no distress Neck: no adenopathy, no carotid bruit, no JVD, supple, symmetrical, trachea midline and thyroid not enlarged, symmetric, no tenderness/mass/nodules Lungs: clear to auscultation bilaterally Heart: regular rate and rhythm, S1, S2 normal, no murmur, click, rub or gallop Extremities: extremities normal, atraumatic, no cyanosis or edema and soft femoral bruits, 2+ pedal pulses bilaterally  EKG normal sinus rhythm at 65 without ST or T-wave changes. I personally reviewed this EKG  ASSESSMENT AND PLAN:   Hypertension History of hypertension with blood pressure measurements at 137/82 on lisinopril. Continue current meds at current dosing  Hyperlipidemia History of  hyperlipidemia followed by her PCP. She is currently not on a statin drug.  Peripheral arterial disease Miss Fukuda was referred for peripheral vascular evaluation. Because of an auscultated bruit in her groin by her PCP abdominal ultrasound was performed that showed mildly  elevated velocities in her iliac arteries. The waveforms were apparently triphasic. She denies claudication. She does have 2+ pedal pulses. She is scheduled for a left total knee replacement in the upcoming future. I'm going to get formal lower extremity arterial Doppler studies but suspect these will be normal. This should not impact her orthopedic procedure.      Lorretta Harp MD FACP,FACC,FAHA, [facility] 04/19/2015 3:54 PM

## 2015-04-22 ENCOUNTER — Ambulatory Visit (HOSPITAL_COMMUNITY)
Admission: RE | Admit: 2015-04-22 | Discharge: 2015-04-22 | Disposition: A | Discharge status: Home / Self Care | Payer: Medicare Other | Source: Ambulatory Visit | Attending: Cardiology | Admitting: Cardiology

## 2015-04-22 DIAGNOSIS — E785 Hyperlipidemia, unspecified: Secondary | ICD-10-CM | POA: Diagnosis not present

## 2015-04-22 DIAGNOSIS — Z01818 Encounter for other preprocedural examination: Secondary | ICD-10-CM | POA: Diagnosis not present

## 2015-04-22 DIAGNOSIS — I10 Essential (primary) hypertension: Secondary | ICD-10-CM | POA: Insufficient documentation

## 2015-04-22 DIAGNOSIS — I739 Peripheral vascular disease, unspecified: Secondary | ICD-10-CM | POA: Diagnosis not present

## 2015-04-22 DIAGNOSIS — R0989 Other specified symptoms and signs involving the circulatory and respiratory systems: Secondary | ICD-10-CM | POA: Diagnosis not present

## 2015-04-29 ENCOUNTER — Inpatient Hospital Stay (HOSPITAL_COMMUNITY): Payer: Medicare Other | Admitting: Anesthesiology

## 2015-04-29 ENCOUNTER — Encounter (HOSPITAL_COMMUNITY): Payer: Self-pay | Admitting: *Deleted

## 2015-04-29 ENCOUNTER — Inpatient Hospital Stay (HOSPITAL_COMMUNITY)
Admission: RE | Admit: 2015-04-29 | Discharge: 2015-05-01 | DRG: 470 | Disposition: A | Discharge status: Home Health | Payer: Medicare Other | Source: Ambulatory Visit | Attending: Orthopedic Surgery | Admitting: Orthopedic Surgery

## 2015-04-29 ENCOUNTER — Encounter (HOSPITAL_COMMUNITY)
Admission: RE | Disposition: A | Discharge status: Home Health | Payer: Self-pay | Source: Ambulatory Visit | Attending: Orthopedic Surgery

## 2015-04-29 DIAGNOSIS — M25562 Pain in left knee: Secondary | ICD-10-CM | POA: Diagnosis present

## 2015-04-29 DIAGNOSIS — I739 Peripheral vascular disease, unspecified: Secondary | ICD-10-CM | POA: Diagnosis present

## 2015-04-29 DIAGNOSIS — E039 Hypothyroidism, unspecified: Secondary | ICD-10-CM | POA: Diagnosis present

## 2015-04-29 DIAGNOSIS — I10 Essential (primary) hypertension: Secondary | ICD-10-CM | POA: Diagnosis present

## 2015-04-29 DIAGNOSIS — R112 Nausea with vomiting, unspecified: Secondary | ICD-10-CM | POA: Diagnosis present

## 2015-04-29 DIAGNOSIS — N390 Urinary tract infection, site not specified: Secondary | ICD-10-CM | POA: Diagnosis present

## 2015-04-29 DIAGNOSIS — R194 Change in bowel habit: Secondary | ICD-10-CM | POA: Diagnosis present

## 2015-04-29 DIAGNOSIS — Z96651 Presence of right artificial knee joint: Secondary | ICD-10-CM | POA: Diagnosis present

## 2015-04-29 DIAGNOSIS — M1712 Unilateral primary osteoarthritis, left knee: Secondary | ICD-10-CM | POA: Diagnosis present

## 2015-04-29 DIAGNOSIS — M1710 Unilateral primary osteoarthritis, unspecified knee: Secondary | ICD-10-CM | POA: Diagnosis present

## 2015-04-29 DIAGNOSIS — Z9889 Other specified postprocedural states: Secondary | ICD-10-CM

## 2015-04-29 DIAGNOSIS — M179 Osteoarthritis of knee, unspecified: Secondary | ICD-10-CM | POA: Diagnosis present

## 2015-04-29 HISTORY — PX: TOTAL KNEE ARTHROPLASTY: SHX125

## 2015-04-29 HISTORY — DX: Unilateral primary osteoarthritis, left knee: M17.12

## 2015-04-29 SURGERY — ARTHROPLASTY, KNEE, TOTAL
Anesthesia: Monitor Anesthesia Care | Site: Knee | Laterality: Left

## 2015-04-29 MED ORDER — LIDOCAINE HCL (CARDIAC) 20 MG/ML IV SOLN
INTRAVENOUS | Status: AC
  Filled 2015-04-29: qty 5

## 2015-04-29 MED ORDER — ONDANSETRON HCL 4 MG/2ML IJ SOLN
4.0000 mg | Freq: Four times a day (QID) | INTRAMUSCULAR | Status: DC | PRN
  Administered 2015-04-29: 4 mg via INTRAVENOUS
  Filled 2015-04-29: qty 2

## 2015-04-29 MED ORDER — ACETAMINOPHEN 650 MG RE SUPP: 650.0000 mg | Freq: Four times a day (QID) | RECTAL | Status: DC | PRN

## 2015-04-29 MED ORDER — EPHEDRINE SULFATE 50 MG/ML IJ SOLN
INTRAMUSCULAR | Status: DC | PRN
  Administered 2015-04-29: 10 mg via INTRAVENOUS

## 2015-04-29 MED ORDER — EPHEDRINE SULFATE 50 MG/ML IJ SOLN
INTRAMUSCULAR | Status: AC
  Filled 2015-04-29: qty 1

## 2015-04-29 MED ORDER — SODIUM CHLORIDE 0.9 % IR SOLN
Status: DC | PRN
  Administered 2015-04-29: 3000 mL

## 2015-04-29 MED ORDER — MORPHINE SULFATE (PF) 2 MG/ML IV SOLN
2.0000 mg | INTRAVENOUS | Status: DC | PRN
  Administered 2015-04-29 – 2015-04-30 (×6): 2 mg via INTRAVENOUS
  Filled 2015-04-29 (×8): qty 1

## 2015-04-29 MED ORDER — BUPIVACAINE-EPINEPHRINE (PF) 0.25% -1:200000 IJ SOLN
INTRAMUSCULAR | Status: AC
  Filled 2015-04-29: qty 30

## 2015-04-29 MED ORDER — PHENYLEPHRINE 40 MCG/ML (10ML) SYRINGE FOR IV PUSH (FOR BLOOD PRESSURE SUPPORT)
PREFILLED_SYRINGE | INTRAVENOUS | Status: AC
  Filled 2015-04-29: qty 10

## 2015-04-29 MED ORDER — ALUM & MAG HYDROXIDE-SIMETH 200-200-20 MG/5ML PO SUSP: 30.0000 mL | ORAL | Status: DC | PRN

## 2015-04-29 MED ORDER — PHENYLEPHRINE HCL 10 MG/ML IJ SOLN
INTRAMUSCULAR | Status: DC | PRN
  Administered 2015-04-29 (×4): 80 ug via INTRAVENOUS
  Administered 2015-04-29 (×2): 40 ug via INTRAVENOUS
  Administered 2015-04-29 (×2): 80 ug via INTRAVENOUS

## 2015-04-29 MED ORDER — ROCURONIUM BROMIDE 50 MG/5ML IV SOLN
INTRAVENOUS | Status: AC
  Filled 2015-04-29: qty 1

## 2015-04-29 MED ORDER — FENTANYL CITRATE (PF) 100 MCG/2ML IJ SOLN: 25.0000 ug | INTRAMUSCULAR | Status: DC | PRN

## 2015-04-29 MED ORDER — ONDANSETRON HCL 4 MG PO TABS: 4.0000 mg | ORAL_TABLET | Freq: Four times a day (QID) | ORAL | Status: DC | PRN

## 2015-04-29 MED ORDER — CEFAZOLIN SODIUM-DEXTROSE 2-3 GM-% IV SOLR
2.0000 g | INTRAVENOUS | Status: AC
  Administered 2015-04-29: 2 g via INTRAVENOUS
  Filled 2015-04-29: qty 50

## 2015-04-29 MED ORDER — ONDANSETRON HCL 4 MG/2ML IJ SOLN
INTRAMUSCULAR | Status: AC
  Filled 2015-04-29: qty 2

## 2015-04-29 MED ORDER — ONDANSETRON HCL 4 MG/2ML IJ SOLN: 4.0000 mg | Freq: Once | INTRAMUSCULAR | Status: DC | PRN

## 2015-04-29 MED ORDER — BUPIVACAINE-EPINEPHRINE 0.25% -1:200000 IJ SOLN
INTRAMUSCULAR | Status: DC | PRN
  Administered 2015-04-29: 30 mL

## 2015-04-29 MED ORDER — HYDROMORPHONE HCL 2 MG PO TABS
2.0000 mg | ORAL_TABLET | ORAL | Status: DC | PRN
  Administered 2015-04-29: 2 mg via ORAL
  Filled 2015-04-29: qty 1

## 2015-04-29 MED ORDER — CEFAZOLIN SODIUM-DEXTROSE 2-3 GM-% IV SOLR
2.0000 g | Freq: Four times a day (QID) | INTRAVENOUS | Status: AC
  Administered 2015-04-29 (×2): 2 g via INTRAVENOUS
  Filled 2015-04-29 (×2): qty 50

## 2015-04-29 MED ORDER — APIXABAN 2.5 MG PO TABS
2.5000 mg | ORAL_TABLET | Freq: Two times a day (BID) | ORAL | Status: DC
  Administered 2015-04-30 – 2015-05-01 (×3): 2.5 mg via ORAL
  Filled 2015-04-29 (×3): qty 1

## 2015-04-29 MED ORDER — CELECOXIB 200 MG PO CAPS
200.0000 mg | ORAL_CAPSULE | Freq: Two times a day (BID) | ORAL | Status: DC
  Administered 2015-04-29 – 2015-05-01 (×4): 200 mg via ORAL
  Filled 2015-04-29 (×5): qty 1

## 2015-04-29 MED ORDER — SODIUM CHLORIDE 0.9 % IR SOLN
Status: DC | PRN
  Administered 2015-04-29: 1000 mL

## 2015-04-29 MED ORDER — PROMETHAZINE HCL 25 MG/ML IJ SOLN
12.5000 mg | Freq: Four times a day (QID) | INTRAMUSCULAR | Status: DC | PRN
  Filled 2015-04-29: qty 1

## 2015-04-29 MED ORDER — DEXAMETHASONE SODIUM PHOSPHATE 10 MG/ML IJ SOLN
10.0000 mg | Freq: Three times a day (TID) | INTRAMUSCULAR | Status: AC
  Administered 2015-04-29: 10 mg via INTRAVENOUS
  Filled 2015-04-29: qty 1

## 2015-04-29 MED ORDER — LACTATED RINGERS IV SOLN
INTRAVENOUS | Status: DC
  Administered 2015-04-29 (×2): via INTRAVENOUS

## 2015-04-29 MED ORDER — POVIDONE-IODINE 7.5 % EX SOLN
Freq: Once | CUTANEOUS | Status: DC
  Filled 2015-04-29: qty 118

## 2015-04-29 MED ORDER — DOCUSATE SODIUM 100 MG PO CAPS
100.0000 mg | ORAL_CAPSULE | Freq: Two times a day (BID) | ORAL | Status: DC
  Administered 2015-04-29 – 2015-05-01 (×4): 100 mg via ORAL
  Filled 2015-04-29 (×4): qty 1

## 2015-04-29 MED ORDER — DEXAMETHASONE SODIUM PHOSPHATE 10 MG/ML IJ SOLN
INTRAMUSCULAR | Status: DC | PRN
  Administered 2015-04-29: 10 mg via INTRAVENOUS

## 2015-04-29 MED ORDER — PROPOFOL 10 MG/ML IV BOLUS
INTRAVENOUS | Status: AC
  Filled 2015-04-29: qty 20

## 2015-04-29 MED ORDER — FENTANYL CITRATE (PF) 100 MCG/2ML IJ SOLN
INTRAMUSCULAR | Status: DC | PRN
  Administered 2015-04-29 (×3): 50 ug via INTRAVENOUS

## 2015-04-29 MED ORDER — ONDANSETRON HCL 4 MG/2ML IJ SOLN
INTRAMUSCULAR | Status: DC | PRN
  Administered 2015-04-29: 4 mg via INTRAVENOUS

## 2015-04-29 MED ORDER — POTASSIUM CHLORIDE IN NACL 20-0.9 MEQ/L-% IV SOLN
INTRAVENOUS | Status: DC
  Administered 2015-04-29 – 2015-04-30 (×2): via INTRAVENOUS
  Filled 2015-04-29 (×2): qty 1000

## 2015-04-29 MED ORDER — SODIUM CHLORIDE 0.9 % IJ SOLN
INTRAMUSCULAR | Status: AC
  Filled 2015-04-29: qty 10

## 2015-04-29 MED ORDER — PROPOFOL 500 MG/50ML IV EMUL
INTRAVENOUS | Status: DC | PRN
  Administered 2015-04-29: 50 ug/kg/min via INTRAVENOUS

## 2015-04-29 MED ORDER — CHLORHEXIDINE GLUCONATE 4 % EX LIQD: 60.0000 mL | Freq: Once | CUTANEOUS | Status: DC

## 2015-04-29 MED ORDER — METOCLOPRAMIDE HCL 5 MG PO TABS: 5.0000 mg | ORAL_TABLET | Freq: Three times a day (TID) | ORAL | Status: DC | PRN

## 2015-04-29 MED ORDER — LISINOPRIL 10 MG PO TABS: 10.0000 mg | ORAL_TABLET | Freq: Every day | ORAL | Status: DC

## 2015-04-29 MED ORDER — MIDAZOLAM HCL 5 MG/5ML IJ SOLN
INTRAMUSCULAR | Status: DC | PRN
  Administered 2015-04-29 (×2): 1 mg via INTRAVENOUS

## 2015-04-29 MED ORDER — POLYETHYLENE GLYCOL 3350 17 G PO PACK
17.0000 g | PACK | Freq: Two times a day (BID) | ORAL | Status: DC
  Administered 2015-04-30: 17 g via ORAL
  Filled 2015-04-29 (×3): qty 1

## 2015-04-29 MED ORDER — FENTANYL CITRATE (PF) 250 MCG/5ML IJ SOLN
INTRAMUSCULAR | Status: AC
  Filled 2015-04-29: qty 5

## 2015-04-29 MED ORDER — ACETAMINOPHEN 325 MG PO TABS
650.0000 mg | ORAL_TABLET | Freq: Four times a day (QID) | ORAL | Status: DC | PRN
  Administered 2015-04-30 – 2015-05-01 (×4): 650 mg via ORAL
  Filled 2015-04-29 (×4): qty 2

## 2015-04-29 MED ORDER — CHLORHEXIDINE GLUCONATE 4 % EX LIQD
60.0000 mL | Freq: Once | CUTANEOUS | Status: DC
Start: 2015-04-29 — End: 2015-04-29

## 2015-04-29 MED ORDER — LEVOTHYROXINE SODIUM 75 MCG PO TABS
75.0000 ug | ORAL_TABLET | Freq: Every day | ORAL | Status: DC
  Administered 2015-04-30 – 2015-05-01 (×2): 75 ug via ORAL
  Filled 2015-04-29 (×2): qty 1

## 2015-04-29 MED ORDER — PHENYLEPHRINE HCL 10 MG/ML IJ SOLN
10.0000 mg | INTRAMUSCULAR | Status: DC | PRN
  Administered 2015-04-29: 50 ug/min via INTRAVENOUS

## 2015-04-29 MED ORDER — DIPHENHYDRAMINE HCL 12.5 MG/5ML PO ELIX: 12.5000 mg | ORAL_SOLUTION | ORAL | Status: DC | PRN

## 2015-04-29 MED ORDER — METOCLOPRAMIDE HCL 5 MG/ML IJ SOLN
5.0000 mg | Freq: Three times a day (TID) | INTRAMUSCULAR | Status: DC | PRN
  Administered 2015-04-29: 10 mg via INTRAVENOUS
  Filled 2015-04-29: qty 2

## 2015-04-29 MED ORDER — MIDAZOLAM HCL 2 MG/2ML IJ SOLN
INTRAMUSCULAR | Status: AC
  Filled 2015-04-29: qty 4

## 2015-04-29 SURGICAL SUPPLY — 76 items
APL SKNCLS STERI-STRIP NONHPOA (GAUZE/BANDAGES/DRESSINGS) ×1
BANDAGE ESMARK 6X9 LF (GAUZE/BANDAGES/DRESSINGS) ×1 IMPLANT
BENZOIN TINCTURE PRP APPL 2/3 (GAUZE/BANDAGES/DRESSINGS) ×3 IMPLANT
BLADE SAGITTAL 25.0X1.19X90 (BLADE) ×2 IMPLANT
BLADE SAGITTAL 25.0X1.19X90MM (BLADE) ×1
BLADE SAW SGTL 11.0X1.19X90.0M (BLADE) IMPLANT
BLADE SAW SGTL 13.0X1.19X90.0M (BLADE) ×3 IMPLANT
BLADE SURG 10 STRL SS (BLADE) ×6 IMPLANT
BNDG CMPR 9X6 STRL LF SNTH (GAUZE/BANDAGES/DRESSINGS) ×1
BNDG CMPR MED 15X6 ELC VLCR LF (GAUZE/BANDAGES/DRESSINGS) ×1
BNDG ELASTIC 6X15 VLCR STRL LF (GAUZE/BANDAGES/DRESSINGS) ×3 IMPLANT
BNDG ESMARK 6X9 LF (GAUZE/BANDAGES/DRESSINGS) ×3
BOWL SMART MIX CTS (DISPOSABLE) ×3 IMPLANT
CAPT KNEE TOTAL 3 ATTUNE ×2 IMPLANT
CEMENT HV SMART SET (Cement) ×6 IMPLANT
CLOSURE WOUND 1/2 X4 (GAUZE/BANDAGES/DRESSINGS) ×1
COVER SURGICAL LIGHT HANDLE (MISCELLANEOUS) ×3 IMPLANT
CUFF TOURNIQUET SINGLE 34IN LL (TOURNIQUET CUFF) ×3 IMPLANT
CUFF TOURNIQUET SINGLE 44IN (TOURNIQUET CUFF) IMPLANT
DECANTER SPIKE VIAL GLASS SM (MISCELLANEOUS) ×1 IMPLANT
DRAPE EXTREMITY T 121X128X90 (DRAPE) ×3 IMPLANT
DRAPE INCISE IOBAN 66X45 STRL (DRAPES) ×1 IMPLANT
DRAPE PROXIMA HALF (DRAPES) ×3 IMPLANT
DRAPE U-SHAPE 47X51 STRL (DRAPES) ×3 IMPLANT
DRSG AQUACEL AG ADV 3.5X14 (GAUZE/BANDAGES/DRESSINGS) ×3 IMPLANT
DRSG PAD ABDOMINAL 8X10 ST (GAUZE/BANDAGES/DRESSINGS) ×6 IMPLANT
DURAPREP 26ML APPLICATOR (WOUND CARE) ×4 IMPLANT
ELECT CAUTERY BLADE 6.4 (BLADE) ×3 IMPLANT
ELECT REM PT RETURN 9FT ADLT (ELECTROSURGICAL) ×3
ELECTRODE REM PT RTRN 9FT ADLT (ELECTROSURGICAL) ×1 IMPLANT
EVACUATOR 1/8 PVC DRAIN (DRAIN) ×3 IMPLANT
FACESHIELD WRAPAROUND (MASK) ×3 IMPLANT
FACESHIELD WRAPAROUND OR TEAM (MASK) ×1 IMPLANT
GAUZE SPONGE 4X4 12PLY STRL (GAUZE/BANDAGES/DRESSINGS) ×3 IMPLANT
GLOVE BIO SURGEON STRL SZ7 (GLOVE) ×3 IMPLANT
GLOVE BIOGEL PI IND STRL 7.0 (GLOVE) ×1 IMPLANT
GLOVE BIOGEL PI IND STRL 7.5 (GLOVE) ×1 IMPLANT
GLOVE BIOGEL PI INDICATOR 7.0 (GLOVE) ×2
GLOVE BIOGEL PI INDICATOR 7.5 (GLOVE) ×2
GLOVE SS BIOGEL STRL SZ 7.5 (GLOVE) ×1 IMPLANT
GLOVE SUPERSENSE BIOGEL SZ 7.5 (GLOVE) ×2
GOWN STRL REUS W/ TWL LRG LVL3 (GOWN DISPOSABLE) ×1 IMPLANT
GOWN STRL REUS W/ TWL XL LVL3 (GOWN DISPOSABLE) ×2 IMPLANT
GOWN STRL REUS W/TWL LRG LVL3 (GOWN DISPOSABLE) ×3
GOWN STRL REUS W/TWL XL LVL3 (GOWN DISPOSABLE) ×6
HANDPIECE INTERPULSE COAX TIP (DISPOSABLE) ×3
HOOD PEEL AWAY FACE SHEILD DIS (HOOD) ×6 IMPLANT
IMMOBILIZER KNEE 22 UNIV (SOFTGOODS) ×3 IMPLANT
KIT BASIN OR (CUSTOM PROCEDURE TRAY) ×3 IMPLANT
KIT ROOM TURNOVER OR (KITS) ×3 IMPLANT
MANIFOLD NEPTUNE II (INSTRUMENTS) ×3 IMPLANT
MARKER SKIN DUAL TIP RULER LAB (MISCELLANEOUS) ×3 IMPLANT
NS IRRIG 1000ML POUR BTL (IV SOLUTION) ×3 IMPLANT
PACK TOTAL JOINT (CUSTOM PROCEDURE TRAY) ×3 IMPLANT
PACK UNIVERSAL I (CUSTOM PROCEDURE TRAY) ×3 IMPLANT
PAD ARMBOARD 7.5X6 YLW CONV (MISCELLANEOUS) ×6 IMPLANT
PADDING CAST COTTON 6X4 STRL (CAST SUPPLIES) ×3 IMPLANT
RUBBERBAND STERILE (MISCELLANEOUS) ×3 IMPLANT
SET HNDPC FAN SPRY TIP SCT (DISPOSABLE) ×1 IMPLANT
STRIP CLOSURE SKIN 1/2X4 (GAUZE/BANDAGES/DRESSINGS) ×2 IMPLANT
SUCTION FRAZIER TIP 10 FR DISP (SUCTIONS) ×3 IMPLANT
SUT ETHIBOND NAB CT1 #1 30IN (SUTURE) IMPLANT
SUT MNCRL AB 3-0 PS2 18 (SUTURE) ×3 IMPLANT
SUT VIC AB 0 CT1 27 (SUTURE) ×9
SUT VIC AB 0 CT1 27XBRD ANBCTR (SUTURE) ×2 IMPLANT
SUT VIC AB 1 CT1 27 (SUTURE) ×3
SUT VIC AB 1 CT1 27XBRD ANBCTR (SUTURE) IMPLANT
SUT VIC AB 2-0 CT1 27 (SUTURE) ×6
SUT VIC AB 2-0 CT1 TAPERPNT 27 (SUTURE) ×2 IMPLANT
SYR 30ML SLIP (SYRINGE) ×3 IMPLANT
TOWEL OR 17X24 6PK STRL BLUE (TOWEL DISPOSABLE) ×3 IMPLANT
TOWEL OR 17X26 10 PK STRL BLUE (TOWEL DISPOSABLE) ×3 IMPLANT
TRAY FOLEY CATH 16FR SILVER (SET/KITS/TRAYS/PACK) ×3 IMPLANT
TUBE CONNECTING 12'X1/4 (SUCTIONS) ×1
TUBE CONNECTING 12X1/4 (SUCTIONS) ×2 IMPLANT
YANKAUER SUCT BULB TIP NO VENT (SUCTIONS) ×3 IMPLANT

## 2015-04-29 NOTE — Anesthesia Procedure Notes (Addendum)
Spinal Patient location during procedure: OR Start time: 04/29/2015 7:30 AM End time: 04/29/2015 7:35 AM Reason for block: at surgeon's request Staffing Performed by: anesthesiologist  Preanesthetic Checklist Completed: at surgeon's request Spinal Block Patient position: left lateral decubitus Prep: ChloraPrep Patient monitoring: heart rate, cardiac monitor, continuous pulse ox and blood pressure Approach: midline Location: L3-4 Needle Needle type: Tuohy  Needle length: 9 cm Assessment Sensory level: T6 Additional Notes 10 mg 0.75% Bupivacaine injected easily  Anesthesia Regional Block:  Adductor canal block  Pre-Anesthetic Checklist: ,, timeout performed, Correct Patient, Correct Site, Correct Laterality, Correct Procedure, Correct Position, site marked, Risks and benefits discussed,  Surgical consent,  Pre-op evaluation,  At surgeon's request and post-op pain management  Laterality: Left  Prep: chloraprep       Needles:  Injection technique: Single-shot  Needle Type: Echogenic Stimulator Needle     Needle Length: 9cm 9 cm Needle Gauge: 21 and 21 G    Additional Needles:  Procedures: ultrasound guided (picture in chart) Adductor canal block Narrative:  Start time: 04/29/2015 7:00 AM End time: 04/29/2015 7:05 AM Injection made incrementally with aspirations every 5 mL.  Performed by: Personally   Additional Notes: 30 cc 0.5% Bupivacaine injected easily.

## 2015-04-29 NOTE — Progress Notes (Signed)
Report given to mark rn as caregiver 

## 2015-04-29 NOTE — Anesthesia Preprocedure Evaluation (Signed)
Anesthesia Evaluation  Patient identified by MRN, date of birth, ID band Patient awake    Reviewed: Allergy & Precautions, NPO status , Patient's Chart, lab work & pertinent test results  Airway Mallampati: II  TM Distance: >3 FB Neck ROM: Full    Dental  (+) Edentulous Upper, Edentulous Lower   Pulmonary    breath sounds clear to auscultation       Cardiovascular hypertension,  Rhythm:Regular Rate:Normal     Neuro/Psych    GI/Hepatic   Endo/Other    Renal/GU      Musculoskeletal   Abdominal   Peds  Hematology   Anesthesia Other Findings   Reproductive/Obstetrics                             Anesthesia Physical Anesthesia Plan  ASA: II  Anesthesia Plan: MAC and Spinal   Post-op Pain Management:    Induction:   Airway Management Planned: Natural Airway and Simple Face Mask  Additional Equipment:   Intra-op Plan:   Post-operative Plan:   Informed Consent: I have reviewed the patients History and Physical, chart, labs and discussed the procedure including the risks, benefits and alternatives for the proposed anesthesia with the patient or authorized representative who has indicated his/her understanding and acceptance.     Plan Discussed with: CRNA and Anesthesiologist  Anesthesia Plan Comments: (Plan adductor canal block with spinal)        Anesthesia Quick Evaluation

## 2015-04-29 NOTE — Evaluation (Signed)
Physical Therapy Evaluation Patient Details Name: [name] MRN: [id number] DOB: [birth year] Today's Date: 04/29/2015   History of Present Illness  74 y.o. female now s/p Lt TKA. PMH: Rt TKA, dizziness, back pain.   Clinical Impression  Pt is s/p TKA resulting in the deficits listed below (see PT Problem List).  Pt will benefit from skilled PT to increase their independence and safety with mobility to allow discharge to home with assistance. Patient limited by nausea during attempts to mobilize. First attempting to see patient in early afternoon but having nausea with emesis. Upon checking back later, nausea had decreased to point of being able to attempt mobilization. Once sitting at edge of bed patient reported increasing nausea again and required return to supine. Patient encouraged to attempt getting up with nursing again later this evening. Patient refusing CPM and bone foam at this time, educated on need to use for rehabilitation purposes. Patient reporting that she will have them put on later.       Follow Up Recommendations Home health PT;Supervision for mobility/OOB    Equipment Recommendations  None recommended by PT;Other (comment) (reports having equipment at home. )    Recommendations for Other Services       Precautions / Restrictions Precautions Precautions: Knee;Fall Precaution Booklet Issued: Yes (comment) Precaution Comments: HEP provided, education on knee extension provided.  Required Braces or Orthoses: Knee Immobilizer - Left Knee Immobilizer - Left: Discontinue once straight leg raise with < 10 degree lag Restrictions Weight Bearing Restrictions: Yes LLE Weight Bearing: Weight bearing as tolerated      Mobility  Bed Mobility Overal bed mobility: Needs Assistance Bed Mobility: Supine to Sit;Sit to Supine     Supine to sit: Mod assist;HOB elevated (assist and trunk and LLE) Sit to supine: Mod assist (LLE)   General bed mobility comments: patient  reports increasing nausea with sitting edge of bed, unable to attempt standing.   Transfers                 General transfer comment: unable to attempt due to increasing nausea  Ambulation/Gait                Stairs            Wheelchair Mobility    Modified Rankin (Stroke Patients Only)       Balance Overall balance assessment: Needs assistance Sitting-balance support: Bilateral upper extremity supported Sitting balance-Leahy Scale: Poor                                       Pertinent Vitals/Pain Pain Assessment: 0-10 Pain Score: 8  Pain Location: Lt thigh and knee Pain Descriptors / Indicators: Aching Pain Intervention(s): Limited activity within patient's tolerance;Monitored during session;Ice applied    Home Living Family/patient expects to be discharged to:: Private residence Living Arrangements: Spouse/significant other Available Help at Discharge: Family Type of Home: House Home Access: Stairs to enter Entrance Stairs-Rails: Right Entrance Stairs-Number of Steps: 3 Home Layout: One level Home Equipment: Environmental consultant - 2 wheels;Walker - 4 wheels      Prior Function Level of Independence: Independent               Hand Dominance        Extremity/Trunk Assessment   Upper Extremity Assessment: Defer to OT evaluation           Lower Extremity Assessment: LLE deficits/detail  LLE Deficits / Details: assistance needed to move LLE with bed mobility     Communication   Communication: No difficulties  Cognition Arousal/Alertness: Awake/alert Behavior During Therapy: WFL for tasks assessed/performed Overall Cognitive Status: Within Functional Limits for tasks assessed                      General Comments      Exercises        Assessment/Plan    PT Assessment Patient needs continued PT services  PT Diagnosis Difficulty walking;Generalized weakness;Acute pain   PT Problem List Decreased  strength;Decreased range of motion;Decreased activity tolerance;Decreased balance;Decreased mobility;Pain  PT Treatment Interventions DME instruction;Gait training;Stair training;Functional mobility training;Therapeutic activities;Therapeutic exercise;Balance training;Patient/family education   PT Goals (Current goals can be found in the Care Plan section) Acute Rehab PT Goals Patient Stated Goal: go home from hospital PT Goal Formulation: With patient Time For Goal Achievement: 05/13/15 Potential to Achieve Goals: Good    Frequency 7X/week   Barriers to discharge        Co-evaluation               End of Session   Activity Tolerance: Other (comment) (limited by nausea) Patient left: in bed;with call bell/phone within reach;with family/visitor present;Other (comment) (ice on knee, patient refusing CPM and bone foam) Nurse Communication: Mobility status;Precautions;Weight bearing status         Time: 9562-1308 PT Time Calculation (min) (ACUTE ONLY): 24 min   Charges:   PT Evaluation $Initial PT Evaluation Tier I: 1 Procedure PT Treatments $Therapeutic Activity: 8-22 mins   PT G Codes:        Cassell Clement, PT, CSCS Pager [phone] Office [phone]  04/29/2015, 4:25 PM

## 2015-04-29 NOTE — Progress Notes (Signed)
Utilization review completed.  

## 2015-04-29 NOTE — Transfer of Care (Signed)
Immediate Anesthesia Transfer of Care Note  Patient: [name]  Procedure(s) Performed: Procedure(s): TOTAL KNEE ARTHROPLASTY (Left)  Patient Location: PACU  Anesthesia Type:MAC and Spinal  Level of Consciousness: awake, alert , oriented and patient cooperative  Airway & Oxygen Therapy: Patient Spontanous Breathing  Post-op Assessment: Report given to RN and Post -op Vital signs reviewed and stable  Post vital signs: Reviewed and stable  Last Vitals:  Filed Vitals:   04/29/15 0545  BP: 138/74  Pulse: 73  Temp: 36.7 C  Resp: 20    Complications: No apparent anesthesia complications

## 2015-04-29 NOTE — Anesthesia Postprocedure Evaluation (Signed)
  Anesthesia Post-op Note  Patient: [name]  Procedure(s) Performed: Procedure(s): TOTAL KNEE ARTHROPLASTY (Left)  Patient Location: PACU  Anesthesia Type:Spinal  Level of Consciousness: awake, alert  and oriented  Airway and Oxygen Therapy: Patient Spontanous Breathing and Patient connected to nasal cannula oxygen  Post-op Pain: mild  Post-op Assessment: Post-op Vital signs reviewed, Patient's Cardiovascular Status Stable, Respiratory Function Stable, Patent Airway and Pain level controlled LLE Motor Response: Responds to commands LLE Sensation: Decreased RLE Motor Response: Responds to commands (bends  knee to 80 degrees) RLE Sensation: Decreased L Sensory Level: L2-Upper inner thigh, upper buttock R Sensory Level: L2-Upper inner thigh, upper buttock  Post-op Vital Signs: stable  Last Vitals:  Filed Vitals:   04/29/15 1054  BP: 141/74  Pulse: 89  Temp: 36.4 C  Resp: 16    Complications: No apparent anesthesia complications

## 2015-04-29 NOTE — Interval H&P Note (Signed)
History and Physical Interval Note:  04/29/2015 7:21 AM  [name]  has presented today for surgery, with the diagnosis of primary localized OA left knee  The various methods of treatment have been discussed with the patient and family. After consideration of risks, benefits and other options for treatment, the patient has consented to  Procedure(s): TOTAL KNEE ARTHROPLASTY (Left) as a surgical intervention .  The patient's history has been reviewed, patient examined, no change in status, stable for surgery.  I have reviewed the patient's chart and labs.  Questions were answered to the patient's satisfaction.     Elsie Saas A

## 2015-04-29 NOTE — Op Note (Signed)
MRN:     [id number] DOB/AGE:    1940/09/21 / 74 y.o.       OPERATIVE REPORT    DATE OF PROCEDURE:  04/29/2015       PREOPERATIVE DIAGNOSIS:   Primary localized OA left knee      Estimated body mass index is 29.68 kg/(m^2) as calculated from the following:   Height as of this encounter: 5\' 4"  (1.626 m).   Weight as of this encounter: 78.472 kg (173 lb).                                                        POSTOPERATIVE DIAGNOSIS:   same                                                                      PROCEDURE:  Procedure(s): TOTAL KNEE ARTHROPLASTY Using Depuy Attune RP implants #4 narrow Femur, #3Tibia, 28mm attune RP bearing, 29 Patella     SURGEON: Felissa Blouch A    ASSISTANT:  Kirstin Shepperson PA-C   (Present and scrubbed throughout the case, critical for assistance with exposure, retraction, instrumentation, and closure.)         ANESTHESIA: Spinal with Femoral Nerve Block  DRAINS: foley, 2 medium hemovac in knee   TOURNIQUET TIME: 07PXT   COMPLICATIONS:  None     SPECIMENS: None   INDICATIONS FOR PROCEDURE: The patient has  djd left knee, varus deformities, XR shows bone on bone arthritis. Patient has failed all conservative measures including anti-inflammatory medicines, narcotics, attempts at  exercise and weight loss, cortisone injections and viscosupplementation.  Risks and benefits of surgery have been discussed, questions answered.   DESCRIPTION OF PROCEDURE: The patient identified by armband, received  right femoral nerve block and IV antibiotics, in the holding area at [facility]. Patient taken to the operating room, appropriate anesthetic  monitors were attached General endotracheal anesthesia induced with  the patient in supine position, Foley catheter was inserted. Tourniquet  applied high to the operative thigh. Lateral post and foot positioner  applied to the table, the lower extremity was then prepped and draped  in usual sterile fashion from  the ankle to the tourniquet. Time-out procedure was performed. The limb was wrapped with an Esmarch bandage and the tourniquet inflated to 365 mmHg. We began the operation by making the anterior midline incision starting at handbreadth above the patella going over the patella 1 cm medial to and  4 cm distal to the tibial tubercle. Small bleeders in the skin and the  subcutaneous tissue identified and cauterized. Transverse retinaculum was incised and reflected medially and a medial parapatellar arthrotomy was accomplished. the patella was everted and theprepatellar fat pad resected. The superficial medial collateral  ligament was then elevated from anterior to posterior along the proximal  flare of the tibia and anterior half of the menisci resected. The knee was hyperflexed exposing bone on bone arthritis. Peripheral and notch osteophytes as well as the cruciate ligaments were then resected. We continued to  work our way around posteriorly along the proximal tibia, and  externally  rotated the tibia subluxing it out from underneath the femur. A McHale  retractor was placed through the notch and a lateral Hohmann retractor  placed, and we then drilled through the proximal tibia in line with the  axis of the tibia followed by an intramedullary guide rod and 2-degree  posterior slope cutting guide. The tibial cutting guide was pinned into place  allowing resection of 4 mm of bone medially and about 6 mm of bone  laterally because of her varus deformity. Satisfied with the tibial resection, we then  entered the distal femur 2 mm anterior to the PCL origin with the  intramedullary guide rod and applied the distal femoral cutting guide  set at 29mm, with 5 degrees of valgus. This was pinned along the  epicondylar axis. At this point, the distal femoral cut was accomplished without difficulty. We then sized for a #4 narrow femoral component and pinned the guide in 3 degrees of external rotation.The chamfer  cutting guide was pinned into place. The anterior, posterior, and chamfer cuts were accomplished without difficulty followed by  the Attune RP box cutting guide and the box cut. We also removed posterior osteophytes from the posterior femoral condyles. At this  time, the knee was brought into full extension. We checked our  extension and flexion gaps and found them symmetric at 47mm.  The patella thickness measured at 20 mm. We set the cutting guide at 13 and removed the posterior 7 mm  of the patella sized for 29 button and drilled the lollipop. The knee  was then once again hyperflexed exposing the proximal tibia. We sized for a #3 tibial base plate, applied the smokestack and the conical reamer followed by the the Delta fin keel punch. We then hammered into place the Attune RP trial femoral component, inserted a 7-mm trial bearing, trial patellar button, and took the knee through range of motion from 0-130 degrees. No thumb pressure was required for patellar  tracking. At this point, all trial components were removed, a double batch of DePuy HV cement  was mixed and applied to all bony metallic mating surfaces except for the posterior condyles of the femur itself. In order, we  hammered into place the tibial tray and removed excess cement, the femoral component and removed excess cement, a 7-mm Attune RP bearing  was inserted, and the knee brought to full extension with compression.  The patellar button was clamped into place, and excess cement  removed. While the cement cured the wound was irrigated out with normal saline solution pulse lavage, and medium Hemovac drains were placed.. Ligament stability and patellar tracking were checked and found to be excellent. The tourniquet was then released and hemostasis was obtained with cautery. The parapatellar arthrotomy was closed with  #1 ethibond suture. The subcutaneous tissue with 0 and 2-0 undyed  Vicryl suture, and 4-0 Monocryl.. A dressing of  Xeroform,  4 x 4, dressing sponges, Webril, and Ace wrap applied. Needle and sponge count were correct times 2.The patient awakened, extubated, and taken to recovery room without difficulty. Vascular status was normal, pulses 2+ and symmetric.   Lot Medford A 04/29/2015, 9:19 AM

## 2015-04-29 NOTE — Progress Notes (Signed)
Orthopedic Tech Progress Note Patient Details:  [name] 09-13-1940 [id number] Viewed order from doctor's order list CPM Left Knee CPM Left Knee: On Left Knee Flexion (Degrees): 90 Left Knee Extension (Degrees): 0 Additional Comments: trapeze bar patient helper   Hildred Priest 04/29/2015, 10:23 AM

## 2015-04-30 ENCOUNTER — Encounter (HOSPITAL_COMMUNITY): Payer: Self-pay | Admitting: Orthopedic Surgery

## 2015-04-30 LAB — BASIC METABOLIC PANEL
ANION GAP: 9 (ref 5–15)
BUN: 18 mg/dL (ref 6–20)
CALCIUM: 8.7 mg/dL — AB (ref 8.9–10.3)
CO2: 24 mmol/L (ref 22–32)
Chloride: 107 mmol/L (ref 101–111)
Creatinine, Ser: 0.8 mg/dL (ref 0.44–1.00)
Glucose, Bld: 132 mg/dL — ABNORMAL HIGH (ref 65–99)
POTASSIUM: 4.5 mmol/L (ref 3.5–5.1)
SODIUM: 140 mmol/L (ref 135–145)

## 2015-04-30 LAB — CBC
HCT: 32.8 % — ABNORMAL LOW (ref 36.0–46.0)
Hemoglobin: 10.8 g/dL — ABNORMAL LOW (ref 12.0–15.0)
MCH: 29.4 pg (ref 26.0–34.0)
MCHC: 32.9 g/dL (ref 30.0–36.0)
MCV: 89.4 fL (ref 78.0–100.0)
PLATELETS: 190 10*3/uL (ref 150–400)
RBC: 3.67 MIL/uL — AB (ref 3.87–5.11)
RDW: 13.8 % (ref 11.5–15.5)
WBC: 14 10*3/uL — AB (ref 4.0–10.5)

## 2015-04-30 NOTE — Progress Notes (Signed)
Physical Therapy Treatment Patient Details Name: [name] MRN: [id number] DOB: 03/29/1941 Today's Date: 04/30/2015    History of Present Illness 74 y.o. female now s/p Lt TKA. PMH: Rt TKA, dizziness, back pain.     PT Comments    Patient making gradual progress with PT intervention. Patient ambulating 200 feet with rw and supervision. Will attempt stairs tomorrow morning. In CPM following session. Will continue to follow with PT services for mobility progression and independence.   Follow Up Recommendations  Home health PT;Supervision for mobility/OOB     Equipment Recommendations  None recommended by PT    Recommendations for Other Services       Precautions / Restrictions Precautions Precautions: Knee;Fall Required Braces or Orthoses: Knee Immobilizer - Left Knee Immobilizer - Left: Discontinue once straight leg raise with < 10 degree lag Restrictions Weight Bearing Restrictions: Yes LLE Weight Bearing: Weight bearing as tolerated    Mobility  Bed Mobility Overal bed mobility: Needs Assistance Bed Mobility: Sit to Supine       Sit to supine: Min assist   General bed mobility comments: min assist with LLE sit to supine  Transfers Overall transfer level: Needs assistance Equipment used: Rolling walker (2 wheeled) Transfers: Sit to/from Stand Sit to Stand: Supervision         General transfer comment: no cues needed.   Ambulation/Gait Ambulation/Gait assistance: Supervision Ambulation Distance (Feet): 200 Feet Assistive device: Rolling walker (2 wheeled) Gait Pattern/deviations: Step-through pattern Gait velocity: decreased   General Gait Details: cues for knee flexion with swing phase   Stairs            Wheelchair Mobility    Modified Rankin (Stroke Patients Only)       Balance Overall balance assessment: Needs assistance Sitting-balance support: No upper extremity supported Sitting balance-Leahy Scale: Good     Standing  balance support: Bilateral upper extremity supported Standing balance-Leahy Scale: Poor Standing balance comment: using rw                    Cognition Arousal/Alertness: Awake/alert Behavior During Therapy: WFL for tasks assessed/performed Overall Cognitive Status: Within Functional Limits for tasks assessed                      Exercises Total Joint Exercises Ankle Circles/Pumps: AROM;Both;10 reps Quad Sets: Left;10 reps;Strengthening Short Arc Quad: Strengthening;Left;10 reps Heel Slides: AAROM;Left;10 reps Straight Leg Raises: Strengthening;Left;10 reps;Other (comment) (min assist X5, independent X5)    General Comments        Pertinent Vitals/Pain Pain Assessment: 0-10 Pain Score: 6  Pain Location: LT knee Pain Descriptors / Indicators: Sore Pain Intervention(s): Limited activity within patient's tolerance;Monitored during session    Home Living                      Prior Function            PT Goals (current goals can now be found in the care plan section) Acute Rehab PT Goals Patient Stated Goal: home tomorrow PT Goal Formulation: With patient Time For Goal Achievement: 05/13/15 Potential to Achieve Goals: Good Progress towards PT goals: Progressing toward goals    Frequency  7X/week    PT Plan Current plan remains appropriate    Co-evaluation             End of Session Equipment Utilized During Treatment: Gait belt Activity Tolerance: Patient tolerated treatment well Patient left: in chair;in CPM;with call  bell/phone within reach;with family/visitor present     Time: 0352-4818 PT Time Calculation (min) (ACUTE ONLY): 30 min  Charges:  $Gait Training: 8-22 mins $Therapeutic Exercise: 8-22 mins                    G Codes:      Cassell Clement, PT, CSCS Pager [phone] Office [phone]  04/30/2015, 3:03 PM

## 2015-04-30 NOTE — Progress Notes (Signed)
Subjective: 1 Day Post-Op Procedure(s) (LRB): TOTAL KNEE ARTHROPLASTY (Left) Patient reports pain as 5 on 0-10 scale.    Objective: Vital signs in last 24 hours: Temp:  [97.5 F (36.4 C)-97.7 F (36.5 C)] 97.7 F (36.5 C) (10/10 2052) Pulse Rate:  [76-98] 76 (10/11 0111) Resp:  [14-16] 16 (10/11 0111) BP: (106-141)/(47-77) 106/54 mmHg (10/11 0111) SpO2:  [95 %-99 %] 95 % (10/11 0111)  Intake/Output from previous day: 10/10 0701 - 10/11 0700 In: 2988.3 [P.O.:240; I.V.:2748.3] Out: 2500 [Urine:2100; Drains:400] Intake/Output this shift:     Recent Labs  04/30/15 0522  HGB 10.8*    Recent Labs  04/30/15 0522  WBC 14.0*  RBC 3.67*  HCT 32.8*  PLT 190    Recent Labs  04/30/15 0522  NA 140  K 4.5  CL 107  CO2 24  BUN 18  CREATININE 0.80  GLUCOSE 132*  CALCIUM 8.7*   No results for input(s): LABPT, INR in the last 72 hours.  ABD soft Neurovascular intact Sensation intact distally Intact pulses distally Dorsiflexion/Plantar flexion intact Incision: dressing C/D/I  Assessment/Plan: 1 Day Post-Op Procedure(s) (LRB): TOTAL KNEE ARTHROPLASTY (Left)  Principal Problem:   Primary localized osteoarthritis of left knee Active Problems:   Change in bowel habits   PONV (postoperative nausea and vomiting)   Hypertension   Hypothyroidism   Recurrent urinary tract infection   DJD (degenerative joint disease) of knee  Advance diet Up with therapy D/C IV fluids Plan for discharge tomorrow  Nausea improved with mobility.  Had bowel movement this am.  Will plan on D/C tomorrow as nausea continues improve  [name] J 04/30/2015, 9:06 AM

## 2015-04-30 NOTE — Progress Notes (Signed)
Patient was given 2 mg Dilaudid PO and Zofran for possible nausea.  Patient then became nauseated and vomited.  I gave her Morphine for pain and that was the only medication that did not make her have nausea and vomiting after administration.  The patient has an allergy listed for Codeine.  I called Dr. Archie Endo office and received a call from the PA.  Tramadol was given as an addition to her pain regiment but the patient informed me that Tramadol makes her very sick so I did not administer that.  I informed the night shift nurse to only give Morphine that this was the only thing that did not make her sick also not to give Decadron, this causes extreme shaking and the patient does not want to have this given.  We gave only a half dose and she had the shakes for a couple of hours until Morphine was given which seemed to help calm that down.  We will revise her allergies to reflect these changes.

## 2015-04-30 NOTE — Progress Notes (Signed)
OT Cancellation Note  Patient Details Name: [name] MRN: [id number] DOB: [birth year]   Cancelled Treatment:    Reason Eval/Treat Not Completed:  (OT screened). Spoke with pt and she verbalized it was okay for OT to sign off. No OT concerns at this time.  Benito Mccreedy OTR/L [phone] 04/30/2015, 9:41 AM

## 2015-04-30 NOTE — Discharge Instructions (Signed)
Information on my medicine - ELIQUIS (apixaban)  This medication education was reviewed with me or my healthcare representative as part of my discharge preparation.  The pharmacist that spoke with me during my hospital stay was:  Reginia Naas, [facility]  Why was Eliquis prescribed for you? Eliquis was prescribed for you to reduce the risk of blood clots forming after orthopedic surgery.    What do You need to know about Eliquis? Take your Eliquis TWICE DAILY - one tablet in the morning and one tablet in the evening with or without food.  It would be best to take the dose about the same time each day.  If you have difficulty swallowing the tablet whole please discuss with your pharmacist how to take the medication safely.  Take Eliquis exactly as prescribed by your doctor and DO NOT stop taking Eliquis without talking to the doctor who prescribed the medication.  Stopping without other medication to take the place of Eliquis may increase your risk of developing a clot.  After discharge, you should have regular check-up appointments with your healthcare provider that is prescribing your Eliquis.  What do you do if you miss a dose? If a dose of ELIQUIS is not taken at the scheduled time, take it as soon as possible on the same day and twice-daily administration should be resumed.  The dose should not be doubled to make up for a missed dose.  Do not take more than one tablet of ELIQUIS at the same time.  Important Safety Information A possible side effect of Eliquis is bleeding. You should call your healthcare provider right away if you experience any of the following: ? Bleeding from an injury or your nose that does not stop. ? Unusual colored urine (red or dark brown) or unusual colored stools (red or black). ? Unusual bruising for unknown reasons. ? A serious fall or if you hit your head (even if there is no bleeding).  Some medicines may interact with Eliquis and might increase  your risk of bleeding or clotting while on Eliquis. To help avoid this, consult your healthcare provider or pharmacist prior to using any new prescription or non-prescription medications, including herbals, vitamins, non-steroidal anti-inflammatory drugs (NSAIDs) and supplements.  This website has more information on Eliquis (apixaban): http://[email or web address].com/eliquis/home

## 2015-04-30 NOTE — Progress Notes (Signed)
Physical Therapy Treatment Patient Details Name: [name] MRN: [id number] DOB: 04-Mar-1941 Today's Date: 04/30/2015    History of Present Illness 74 y.o. female now s/p Lt TKA. PMH: Rt TKA, dizziness, back pain.     PT Comments    Patient with decreased complaints of nausea today, allowing for mobility progression. Patient ambulating 200 feet with rolling walker and min guard. Able to begin HEP also prior to ambulation. Will continue with PT for mobility progression for anticipated D/C to home with assistance.   Follow Up Recommendations  Home health PT;Supervision for mobility/OOB     Equipment Recommendations  None recommended by PT    Recommendations for Other Services       Precautions / Restrictions Precautions Precautions: Knee;Fall Precaution Booklet Issued: Yes (comment) Precaution Comments: HEP provided, education on knee extension provided.  Required Braces or Orthoses: Knee Immobilizer - Left Knee Immobilizer - Left: Discontinue once straight leg raise with < 10 degree lag Restrictions Weight Bearing Restrictions: Yes LLE Weight Bearing: Weight bearing as tolerated    Mobility  Bed Mobility               General bed mobility comments: in chair upon arrival  Transfers Overall transfer level: Needs assistance Equipment used: Rolling walker (2 wheeled) Transfers: Sit to/from Stand Sit to Stand: Min guard         General transfer comment: reminder for hand position with rw  Ambulation/Gait Ambulation/Gait assistance: Min guard Ambulation Distance (Feet): 200 Feet Assistive device: Rolling walker (2 wheeled) Gait Pattern/deviations: Step-through pattern Gait velocity: decreased   General Gait Details: cues for posture and keeping walker close   Stairs            Wheelchair Mobility    Modified Rankin (Stroke Patients Only)       Balance Overall balance assessment: Needs assistance Sitting-balance support: No upper extremity  supported Sitting balance-Leahy Scale: Good     Standing balance support: Bilateral upper extremity supported Standing balance-Leahy Scale: Poor Standing balance comment: using rw                    Cognition Arousal/Alertness: Awake/alert Behavior During Therapy: WFL for tasks assessed/performed Overall Cognitive Status: Within Functional Limits for tasks assessed                      Exercises Total Joint Exercises Ankle Circles/Pumps: AROM;Both;10 reps Quad Sets: Left;10 reps;Strengthening Short Arc Quad: Strengthening;Left;10 reps Heel Slides: AAROM;Left;10 reps Straight Leg Raises: Strengthening;Left;10 reps;Other (comment) (mod assist) Goniometric ROM: 61 degrees flexion    General Comments        Pertinent Vitals/Pain Pain Assessment: 0-10 Pain Score: 7  Pain Location: Lt knee Pain Descriptors / Indicators: Sore Pain Intervention(s): Limited activity within patient's tolerance;Monitored during session    Home Living                      Prior Function            PT Goals (current goals can now be found in the care plan section) Acute Rehab PT Goals Patient Stated Goal: go home from hospital PT Goal Formulation: With patient Time For Goal Achievement: 05/13/15 Potential to Achieve Goals: Good Progress towards PT goals: Progressing toward goals    Frequency  7X/week    PT Plan Current plan remains appropriate    Co-evaluation             End of Session  Equipment Utilized During Treatment: Gait belt;Left knee immobilizer Activity Tolerance: Patient tolerated treatment well Patient left: in chair;with call bell/phone within reach;with family/visitor present     Time: 2780-0447 PT Time Calculation (min) (ACUTE ONLY): 24 min  Charges:  $Gait Training: 8-22 mins $Therapeutic Exercise: 8-22 mins                    G Codes:      Cassell Clement, PT, CSCS Pager [phone] Office [phone]  04/30/2015, 10:49  AM

## 2015-04-30 NOTE — Progress Notes (Signed)
Orthopedic Tech Progress Note Patient Details:  [name] 08-18-1940 [id number] On cpm at 6:50 pm 0-65 Patient ID: [name], female   DOB: 06-Nov-1940, 74 y.o.   MRN: [id number]   [name] 04/30/2015, 6:51 PM

## 2015-05-01 LAB — BASIC METABOLIC PANEL
ANION GAP: 13 (ref 5–15)
BUN: 21 mg/dL — AB (ref 6–20)
CHLORIDE: 106 mmol/L (ref 101–111)
CO2: 22 mmol/L (ref 22–32)
Calcium: 8.7 mg/dL — ABNORMAL LOW (ref 8.9–10.3)
Creatinine, Ser: 0.82 mg/dL (ref 0.44–1.00)
GFR calc Af Amer: >60 mL/min (ref >60)
GLUCOSE: 85 mg/dL (ref 65–99)
POTASSIUM: 4.2 mmol/L (ref 3.5–5.1)
Sodium: 141 mmol/L (ref 135–145)

## 2015-05-01 LAB — CBC
HCT: 30.6 % — ABNORMAL LOW (ref 36.0–46.0)
HEMOGLOBIN: 10.2 g/dL — AB (ref 12.0–15.0)
MCH: 29.2 pg (ref 26.0–34.0)
MCHC: 33.3 g/dL (ref 30.0–36.0)
MCV: 87.7 fL (ref 78.0–100.0)
PLATELETS: 183 10*3/uL (ref 150–400)
RBC: 3.49 MIL/uL — AB (ref 3.87–5.11)
RDW: 14.4 % (ref 11.5–15.5)
WBC: 11.5 10*3/uL — AB (ref 4.0–10.5)

## 2015-05-01 MED ORDER — HYDROMORPHONE HCL 2 MG PO TABS: ORAL_TABLET | ORAL | Status: DC

## 2015-05-01 MED ORDER — ACETAMINOPHEN 325 MG PO TABS: 650.0000 mg | ORAL_TABLET | Freq: Four times a day (QID) | ORAL | Status: DC | PRN

## 2015-05-01 MED ORDER — DOCUSATE SODIUM 100 MG PO CAPS: ORAL_CAPSULE | ORAL | Status: DC

## 2015-05-01 MED ORDER — CELECOXIB 200 MG PO CAPS: ORAL_CAPSULE | ORAL | Status: DC

## 2015-05-01 MED ORDER — ASPIRIN EC 325 MG PO TBEC: DELAYED_RELEASE_TABLET | ORAL | Status: DC

## 2015-05-01 MED ORDER — POLYETHYLENE GLYCOL 3350 17 G PO PACK: PACK | ORAL | Status: DC

## 2015-05-01 NOTE — Progress Notes (Signed)
Provided patient with discharge instructions including reviewing AVS with medications, restrictions, dressing care/ management, and post TKA instructions along with reviewing prescriptions. All questions answered. Provided ice to place on knee for the drive home. Left via w/c with husband with a volunteer assisted by a nursing student pushing a cart with personal belongings - to front [employer]	[place] exit.

## 2015-05-01 NOTE — Progress Notes (Signed)
Physical Therapy Treatment Patient Details Name: [name] MRN: [id number] DOB: Feb 08, 1941 Today's Date: 05/01/2015    History of Present Illness 74 y.o. female now s/p Lt TKA. PMH: Rt TKA, dizziness, back pain.     PT Comments    Patient is making good progress with PT.  From a mobility standpoint anticipate patient will be ready for DC home with support. Prior to session patient returning from ambulation with Kirstin Shepperson PA-C where gair and stairs were performed. Emphasis of session was on thorough education and performance of HEP. Patient denies any questions or concerns following session.        Follow Up Recommendations  Home health PT;Supervision for mobility/OOB     Equipment Recommendations  None recommended by PT    Recommendations for Other Services       Precautions / Restrictions Precautions Precautions: Knee Precaution Booklet Issued: Yes (comment) Precaution Comments: HEP provided, education on knee extension provided.  Restrictions Weight Bearing Restrictions: Yes LLE Weight Bearing: Weight bearing as tolerated    Mobility  Bed Mobility               General bed mobility comments: up in chair upon arrival, reports feeling confident with getting in and out of bed.   Transfers                 General transfer comment: no transfers performed.  Ambulation/Gait                 Stairs            Wheelchair Mobility    Modified Rankin (Stroke Patients Only)       Balance Overall balance assessment: Needs assistance Sitting-balance support: No upper extremity supported Sitting balance-Leahy Scale: Normal                              Cognition Arousal/Alertness: Awake/alert Behavior During Therapy: WFL for tasks assessed/performed Overall Cognitive Status: Within Functional Limits for tasks assessed                      Exercises Total Joint Exercises Ankle Circles/Pumps: AROM;Both;10  reps Quad Sets: Left;10 reps;Strengthening Towel Squeeze: Strengthening;Both;10 reps Short Arc Quad: Strengthening;Left;10 reps Heel Slides: AAROM;Left;10 reps Hip ABduction/ADduction: Strengthening;Left;10 reps Straight Leg Raises: Strengthening;Left;10 reps;Other (comment) (min assist) Long Arc Quad: Strengthening;Left;10 reps Knee Flexion: AAROM;Left;10 reps Goniometric ROM: 91 degrees flexion in sitting    General Comments        Pertinent Vitals/Pain Pain Assessment: 0-10 Pain Score: 8  Pain Location: Lt knee Pain Descriptors / Indicators: Aching Pain Intervention(s): Limited activity within patient's tolerance;Monitored during session    Home Living                      Prior Function            PT Goals (current goals can now be found in the care plan section) Acute Rehab PT Goals Patient Stated Goal: go home as soon as possible PT Goal Formulation: With patient Time For Goal Achievement: 05/13/15 Potential to Achieve Goals: Good Progress towards PT goals: Progressing toward goals    Frequency  7X/week    PT Plan Current plan remains appropriate    Co-evaluation             End of Session Equipment Utilized During Treatment: Gait belt Activity Tolerance: Patient tolerated treatment well Patient left:  in chair;with call bell/phone within reach;with family/visitor present     Time: 0851-0921 PT Time Calculation (min) (ACUTE ONLY): 30 min  Charges:  $Therapeutic Exercise: 23-37 mins                    G Codes:      Cassell Clement, PT, CSCS Pager [phone] Office [phone]  05/01/2015, 11:23 AM

## 2015-05-01 NOTE — Care Management Note (Signed)
Case Management Note  Patient Details  Name: [name] MRN: [id number] Date of Birth: [birth year]  Subjective/Objective:             S/p left total knee arthroplasty       Action/Plan: Set up with [facility] for HHPT by MD office. Spoke with patient, no change in discharge plan. Patient stated that she will have family to assist after discharge. Patient stated that she has a rolling walker and 3N1 at home. [employer] will be providing CPM.      Expected Discharge Date:                  Expected Discharge Plan:  [place]  In-House Referral:  NA  Discharge planning Services  CM Consult  Post Acute Care Choice:  Home Health, Durable Medical Equipment Choice offered to:  Patient  DME Arranged:  CPM DME Agency:  [employer]  HH Arranged:  PT [place] Agency:  [place]  Status of Service:  Completed, signed off  Medicare Important Message Given:    Date Medicare IM Given:    Medicare IM give by:    Date Additional Medicare IM Given:    Additional Medicare Important Message give by:     If discussed at [place] of Stay Meetings, dates discussed:    Additional Comments:  Nila Nephew, RN 05/01/2015, 9:52 AM

## 2015-05-01 NOTE — Discharge Summary (Signed)
Patient ID: [name] MRN: [id number] DOB/AGE: August 30, 1940 74 y.o.  Admit date: 04/29/2015 Discharge date: 05/01/2015  Admission Diagnoses:  Principal Problem:   Primary localized osteoarthritis of left knee Active Problems:   Change in bowel habits   PONV (postoperative nausea and vomiting)   Hypertension   Hypothyroidism   Recurrent urinary tract infection   DJD (degenerative joint disease) of knee   Discharge Diagnoses:  Same  Past Medical History  Diagnosis Date  . Complication of anesthesia   . PONV (postoperative nausea and vomiting)   . Primary localized osteoarthritis of right knee   . Hypertension     takes Lisinopril daily  . Hypothyroidism     takes Synthroid daily  . Constipation     takes Colace daily and Milk of Mag  . Hyperlipidemia     was on meds but stopped taking back in June 2015  . Pneumonia     as a baby  . Dizziness     but goes away real quick  . Joint pain   . Back pain     comepensating for knee pain per pt  . History of colon polyps   . Urinary frequency   . Urinary urgency   . Cataract     right eye and immature  . Dry eyes     uses Eye Drops daily as needed  . Primary localized osteoarthritis of left knee 04/17/2015  . Peripheral arterial disease ([place])     Surgeries: Procedure(s): TOTAL KNEE ARTHROPLASTY on 04/29/2015   Consultants:    Discharged Condition: Improved  Hospital Course: [name] is an 74 y.o. female who was admitted 04/29/2015 for operative treatment ofPrimary localized osteoarthritis of left knee. Patient has severe unremitting pain that affects sleep, daily activities, and work/hobbies. After pre-op clearance the patient was taken to the operating room on 04/29/2015 and underwent  Procedure(s): TOTAL KNEE ARTHROPLASTY.    Patient was given perioperative antibiotics: Anti-infectives    Start     Dose/Rate Route Frequency Ordered Stop   04/29/15 1130  ceFAZolin (ANCEF) IVPB 2 g/50 mL premix     2  g 100 mL/hr over 30 Minutes Intravenous Every 6 hours 04/29/15 1116 04/29/15 2048   04/29/15 0541  ceFAZolin (ANCEF) IVPB 2 g/50 mL premix     2 g 100 mL/hr over 30 Minutes Intravenous On call to O.R. 04/29/15 0541 04/29/15 0755       Patient was given sequential compression devices, early ambulation, and chemoprophylaxis to prevent DVT.  Patient benefited maximally from hospital stay and there were no complications.    Recent vital signs: Patient Vitals for the past 24 hrs:  BP Temp Temp src Pulse Resp SpO2  05/01/15 0452 128/70 mmHg 98.5 F (36.9 C) Oral 69 18 98 %  04/30/15 2009 131/63 mmHg 98.2 F (36.8 C) Oral 84 17 99 %     Recent laboratory studies:  Recent Labs  04/30/15 0522 05/01/15 0635  WBC 14.0* 11.5*  HGB 10.8* 10.2*  HCT 32.8* 30.6*  PLT 190 183  NA 140 141  K 4.5 4.2  CL 107 106  CO2 24 22  BUN 18 21*  CREATININE 0.80 0.82  GLUCOSE 132* 85  CALCIUM 8.7* 8.7*     Discharge Medications:     Medication List    STOP taking these medications        OMEGA-3 FISH OIL PO      TAKE these medications  acetaminophen 325 MG tablet  Commonly known as:  TYLENOL  Take 2 tablets (650 mg total) by mouth every 6 (six) hours as needed for mild pain (or Fever >/= 101).     aspirin EC 325 MG tablet  1 tab a day for the next 30 days to prevent blood clots     celecoxib 200 MG capsule  Commonly known as:  CELEBREX  1 tab po q day with food for pain and  swelling     docusate sodium 100 MG capsule  Commonly known as:  COLACE  1 tab 2 times a day while on narcotics.  STOOL SOFTENER     HYDROmorphone 2 MG tablet  Commonly known as:  DILAUDID  1-2 tablets every 4-6 hrs as needed for pain     levothyroxine 75 MCG tablet  Commonly known as:  SYNTHROID, LEVOTHROID  Take 75 mcg by mouth daily before breakfast.     lisinopril 10 MG tablet  Commonly known as:  PRINIVIL,ZESTRIL  Take 10 mg by mouth at bedtime.     magnesium hydroxide 400 MG/5ML  suspension  Commonly known as:  MILK OF MAGNESIA  Take 60 mLs by mouth daily as needed for mild constipation.     polyethylene glycol packet  Commonly known as:  MIRALAX / GLYCOLAX  17grams in 16 oz of water twice a day until bowel movement.  LAXITIVE.  Restart if two days since last bowel movement        Diagnostic Studies: US Abdomen Complete  04/12/2015  CLINICAL DATA:  Generalized abdominal pain. Abdominal bruit. Horseshoe kidney. EXAM: ULTRASOUND ABDOMEN COMPLETE; [place] ARTFLOW COMPARISON:  None. FINDINGS: Gallbladder: No gallstones or wall thickening visualized. No sonographic Murphy sign noted. Common bile duct: Diameter: 3 mm Liver: No focal lesion identified. Within normal limits in parenchymal echogenicity. IVC: No abnormality visualized. Pancreas: Visualized portion unremarkable. Spleen: Size and appearance within normal limits. Right Kidney: Length: 9.3 cm. Echogenicity within normal limits. No mass or hydronephrosis visualized. Left Kidney: Length: 9.4 cm. Echogenicity within normal limits. No mass or hydronephrosis visualized. Abdominal aorta: No aneurysm visualized. Other findings: Evaluation of the aorta and iliac vasculature with Doppler analysis was also performed. Maximal aortic caliber is 1.9 cm in the proximal abdomen. Iliac artery diameters were not measured on this study. Doppler analysis of the right common external iliac arteries demonstrates a triphasic waveform with maximal velocities of 130 cm/sec and 244 cm/sec respectively. Portions of the external iliac artery were obscured. Doppler analysis of the left common and external iliac arteries also demonstrates a triphasic waveform with maximal velocity is of 105 and 204 centimeters/second respectively. IMPRESSION: No acute intra-abdominal pathology. Horseshoe kidney anatomy was not visualized on this study. It is suggested on a prior MR lumbar spine. The CT dated 2003 is not available for visualization. There are elevated  velocities in the bilateral external iliac arteries as described above. This is likely the cause of the bruits, and may represent significant arterial narrowing. CT angiogram of the pelvis can be performed to further delineate. If there is a history of claudication or lower extremity rest pain, noninvasive lower extremity Doppler analysis as well as aorta and bilateral CTA runoff can be performed. Electronically Signed   By: Marybelle Killings M.D.   On: 04/12/2015 10:53   [place] Art/ven Flow Abd Pelv Doppler Limited  04/12/2015  CLINICAL DATA:  Generalized abdominal pain. Abdominal bruit. Horseshoe kidney. EXAM: ULTRASOUND ABDOMEN COMPLETE; [place] ARTFLOW COMPARISON:  None. FINDINGS: Gallbladder: No gallstones or  wall thickening visualized. No sonographic Murphy sign noted. Common bile duct: Diameter: 3 mm Liver: No focal lesion identified. Within normal limits in parenchymal echogenicity. IVC: No abnormality visualized. Pancreas: Visualized portion unremarkable. Spleen: Size and appearance within normal limits. Right Kidney: Length: 9.3 cm. Echogenicity within normal limits. No mass or hydronephrosis visualized. Left Kidney: Length: 9.4 cm. Echogenicity within normal limits. No mass or hydronephrosis visualized. Abdominal aorta: No aneurysm visualized. Other findings: Evaluation of the aorta and iliac vasculature with Doppler analysis was also performed. Maximal aortic caliber is 1.9 cm in the proximal abdomen. Iliac artery diameters were not measured on this study. Doppler analysis of the right common external iliac arteries demonstrates a triphasic waveform with maximal velocities of 130 cm/sec and 244 cm/sec respectively. Portions of the external iliac artery were obscured. Doppler analysis of the left common and external iliac arteries also demonstrates a triphasic waveform with maximal velocity is of 105 and 204 centimeters/second respectively. IMPRESSION: No acute intra-abdominal pathology. Horseshoe kidney anatomy was  not visualized on this study. It is suggested on a prior MR lumbar spine. The CT dated 2003 is not available for visualization. There are elevated velocities in the bilateral external iliac arteries as described above. This is likely the cause of the bruits, and may represent significant arterial narrowing. CT angiogram of the pelvis can be performed to further delineate. If there is a history of claudication or lower extremity rest pain, noninvasive lower extremity Doppler analysis as well as aorta and bilateral CTA runoff can be performed. Electronically Signed   By: Marybelle Killings M.D.   On: 04/12/2015 10:53    Disposition: 06-Home-Health Care Svc      Discharge Instructions    CPM    Complete by:  As directed   Continuous passive motion machine (CPM):      Use the CPM from 0 to 90 for 6 hours per day.       You may break it up into 2 or 3 sessions per day.      Use CPM for 2 weeks or until you are told to stop.     Call MD / Call 911    Complete by:  As directed   If you experience chest pain or shortness of breath, CALL 911 and be transported to the hospital emergency room.  If you develope a fever above 101 F, pus ([place] drainage) or increased drainage or redness at the wound, or calf pain, call your surgeon's office.     Change dressing    Complete by:  As directed   Change the gauze dressing daily with sterile 4 x 4 inch gauze and apply TED hose.  DO NOT REMOVE BANDAGE OVER SURGICAL INCISION.  [place] WHOLE LEG INCLUDING OVER THE WATERPROOF BANDAGE WITH SOAP AND WATER EVERY DAY.     Constipation Prevention    Complete by:  As directed   Drink plenty of fluids.  Prune juice may be helpful.  You may use a stool softener, such as Colace (over the counter) 100 mg twice a day.  Use MiraLax (over the counter) for constipation as needed.     Diet - low sodium heart healthy    Complete by:  As directed      Discharge instructions    Complete by:  As directed   INSTRUCTIONS AFTER JOINT  REPLACEMENT   Remove items at home which could result in a fall. This includes throw rugs or furniture in walking pathways ICE to the affected  joint every three hours while awake for 30 minutes at a time, for at least the first 3-5 days, and then as needed for pain and swelling.  Continue to use ice for pain and swelling. You may notice swelling that will progress down to the foot and ankle.  This is normal after surgery.  Elevate your leg when you are not up walking on it.   Continue to use the breathing machine you got in the hospital (incentive spirometer) which will help keep your temperature down.  It is common for your temperature to cycle up and down following surgery, especially at night when you are not up moving around and exerting yourself.  The breathing machine keeps your lungs expanded and your temperature down.   DIET:  As you were doing prior to hospitalization, we recommend a well-balanced diet.  DRESSING / WOUND CARE / SHOWERING  Keep the surgical dressing until follow up.  The dressing is water proof, so you can shower without any extra covering.  IF THE DRESSING FALLS OFF or the wound gets wet inside, change the dressing with sterile gauze.  Please use good hand washing techniques before changing the dressing.  Do not use any lotions or creams on the incision until instructed by your surgeon.    ACTIVITY  Increase activity slowly as tolerated, but follow the weight bearing instructions below.   No driving for 6 weeks or until further direction given by your physician.  You cannot drive while taking narcotics.  No lifting or carrying greater than 10 lbs. until further directed by your surgeon. Avoid periods of inactivity such as sitting longer than an hour when not asleep. This helps prevent blood clots.  You may return to work once you are authorized by your doctor.     WEIGHT BEARING   Weight bearing as tolerated with assist device (walker, cane, etc) as directed, use it  as long as suggested by your surgeon or therapist, typically at least 2-3 weeks.   EXERCISES  Results after joint replacement surgery are often greatly improved when you follow the exercise, range of motion and muscle strengthening exercises prescribed by your doctor. Safety measures are also important to protect the joint from further injury. Any time any of these exercises cause you to have increased pain or swelling, decrease what you are doing until you are comfortable again and then slowly increase them. If you have problems or questions, call your caregiver or physical therapist for advice.   Rehabilitation is important following a joint replacement. After just a few days of immobilization, the muscles of the leg can become weakened and shrink (atrophy).  These exercises are designed to build up the tone and strength of the thigh and leg muscles and to improve motion. Often times heat used for twenty to thirty minutes before working out will loosen up your tissues and help with improving the range of motion but do not use heat for the first two weeks following surgery (sometimes heat can increase post-operative swelling).   These exercises can be done on a training (exercise) mat, on the floor, on a table or on a bed. Use whatever works the best and is most comfortable for you.    Use music or television while you are exercising so that the exercises are a pleasant break in your day. This will make your life better with the exercises acting as a break in your routine that you can look forward to.   Perform all exercises about  fifteen times, three times per day or as directed.  You should exercise both the operative leg and the other leg as well.   Exercises include:   Quad Sets - Tighten up the muscle on the front of the thigh (Quad) and hold for 5-10 seconds.   Straight Leg Raises - With your knee straight (if you were given a brace, keep it on), lift the leg to 60 degrees, hold for 3 seconds,  and slowly lower the leg.  Perform this exercise against resistance later as your leg gets stronger.  Leg Slides: Lying on your back, slowly slide your foot toward your buttocks, bending your knee up off the floor (only go as far as is comfortable). Then slowly slide your foot back down until your leg is flat on the floor again.  Angel Wings: Lying on your back spread your legs to the side as far apart as you can without causing discomfort.  Hamstring Strength:  Lying on your back, push your heel against the floor with your leg straight by tightening up the muscles of your buttocks.  Repeat, but this time bend your knee to a comfortable angle, and push your heel against the floor.  You may put a pillow under the heel to make it more comfortable if necessary.   A rehabilitation program following joint replacement surgery can speed recovery and prevent re-injury in the future due to weakened muscles. Contact your doctor or a physical therapist for more information on knee rehabilitation.    CONSTIPATION  Constipation is defined medically as fewer than three stools per week and severe constipation as less than one stool per week.  Even if you have a regular bowel pattern at home, your normal regimen is likely to be disrupted due to multiple reasons following surgery.  Combination of anesthesia, postoperative narcotics, change in appetite and fluid intake all can affect your bowels.   YOU MUST use at least one of the following options; they are listed in order of increasing strength to get the job done.  They are all available over the counter, and you may need to use some, POSSIBLY even all of these options:    Drink plenty of fluids (prune juice may be helpful) and high fiber foods Colace 100 mg by mouth twice a day  Senokot for constipation as directed and as needed Dulcolax (bisacodyl), take with full glass of water  Miralax (polyethylene glycol) once or twice a day as needed.  If you have tried  all these things and are unable to have a bowel movement in the first 3-4 days after surgery call either your surgeon or your primary doctor.    If you experience loose stools or diarrhea, hold the medications until you stool forms back up.  If your symptoms do not get better within 1 week or if they get worse, check with your doctor.  If you experience "the worst abdominal pain ever" or develop nausea or vomiting, please contact the office immediately for further recommendations for treatment.   ITCHING:  If you experience itching with your medications, try taking only a single pain pill, or even half a pain pill at a time.  You can also use Benadryl over the counter for itching or also to help with sleep.   TED HOSE STOCKINGS:  Use stockings on both legs until for at least 2 weeks or as directed by physician office. They may be removed at night for sleeping.  MEDICATIONS:  See your medication summary  on the "After Visit Summary" that nursing will review with you.  You may have some home medications which will be placed on hold until you complete the course of blood thinner medication.  It is important for you to complete the blood thinner medication as prescribed.  PRECAUTIONS:  If you experience chest pain or shortness of breath - call 911 immediately for transfer to the hospital emergency department.   If you develop a fever greater that 101 F, purulent drainage from wound, increased redness or drainage from wound, foul odor from the wound/dressing, or calf pain - CONTACT YOUR SURGEON.                                                   FOLLOW-UP APPOINTMENTS:  If you do not already have a post-op appointment, please call the office for an appointment to be seen by your surgeon.  Guidelines for how soon to be seen are listed in your "After Visit Summary", but are typically between 1-4 weeks after surgery.  OTHER INSTRUCTIONS:   Knee Replacement:  Do not place pillow under knee, focus on keeping  the knee straight while resting. CPM instructions: 0-90 degrees, 2 hours in the morning, 2 hours in the afternoon, and 2 hours in the evening. Place foam block, curve side up under heel at all times except when in CPM or when walking.  DO NOT modify, tear, cut, or change the foam block in any way.  MAKE SURE YOU:  Understand these instructions.  Get help right away if you are not doing well or get worse.    Thank you for letting us be a part of your medical care team.  It is a privilege we respect greatly.  We hope these instructions will help you stay on track for a fast and full recovery!     Do not put a pillow under the knee. Place it under the heel.    Complete by:  As directed   Place gray foam block, curve side up under heel at all times except when in CPM or when walking.  DO NOT modify, tear, cut, or change in any way the gray foam block.     Increase activity slowly as tolerated    Complete by:  As directed      TED hose    Complete by:  As directed   Use stockings (TED hose) for 2 weeks on both leg(s).  You may remove them at night for sleeping.           Follow-up Information    Follow up with Lorn Junes, MD On 05/14/2015.   Specialty:  Orthopedic Surgery   Why:  appt time 3:15pm   Contact information:   [address] [place] [address] [phone]        Signed: Aarian Hedges 05/01/2015, 8:55 AM

## 2015-05-08 ENCOUNTER — Other Ambulatory Visit (HOSPITAL_COMMUNITY): Payer: Self-pay | Admitting: Family Medicine

## 2015-05-08 DIAGNOSIS — I771 Stricture of artery: Secondary | ICD-10-CM

## 2015-05-10 ENCOUNTER — Ambulatory Visit (HOSPITAL_COMMUNITY): Payer: Medicare Other

## 2015-08-31 NOTE — Progress Notes (Signed)
REVIEWED-NO ADDITIONAL RECOMMENDATIONS. 

## 2015-11-25 ENCOUNTER — Ambulatory Visit (HOSPITAL_COMMUNITY)
Admission: RE | Admit: 2015-11-25 | Discharge: 2015-11-25 | Disposition: A | Discharge status: Home / Self Care | Payer: Medicare Other | Source: Ambulatory Visit | Attending: Registered Nurse | Admitting: Registered Nurse

## 2015-11-25 ENCOUNTER — Other Ambulatory Visit (HOSPITAL_COMMUNITY): Payer: Self-pay | Admitting: Registered Nurse

## 2015-11-25 DIAGNOSIS — R1031 Right lower quadrant pain: Secondary | ICD-10-CM | POA: Diagnosis present

## 2015-11-25 DIAGNOSIS — R1011 Right upper quadrant pain: Secondary | ICD-10-CM | POA: Diagnosis not present

## 2015-11-25 LAB — POCT I-STAT CREATININE: Creatinine, Ser: 0.9 mg/dL (ref 0.44–1.00)

## 2015-11-25 MED ORDER — IOPAMIDOL (ISOVUE-300) INJECTION 61%
100.0000 mL | Freq: Once | INTRAVENOUS | Status: AC | PRN
  Administered 2015-11-25: 100 mL via INTRAVENOUS

## 2015-12-17 ENCOUNTER — Encounter (HOSPITAL_COMMUNITY)
Admission: RE | Admit: 2015-12-17 | Discharge: 2015-12-17 | Disposition: A | Discharge status: Home / Self Care | Payer: Medicare Other | Source: Ambulatory Visit | Attending: Ophthalmology | Admitting: Ophthalmology

## 2015-12-17 ENCOUNTER — Encounter (HOSPITAL_COMMUNITY): Payer: Self-pay

## 2015-12-17 DIAGNOSIS — Z01812 Encounter for preprocedural laboratory examination: Secondary | ICD-10-CM | POA: Insufficient documentation

## 2015-12-17 NOTE — Pre-Procedure Instructions (Signed)
Patient given information to sign up for my chart at home. 

## 2015-12-17 NOTE — Patient Instructions (Signed)
Your procedure is scheduled on: 12/23/2015  Report to [facility] at  750  AM.  Call this number if you have problems the morning of surgery: [phone]   Do not eat food or drink liquids :After Midnight.      Take these medicines the morning of surgery with A SIP OF WATER: levothyroxine, lisinopril.   Do not wear jewelry, make-up or nail polish.  Do not wear lotions, powders, or perfumes. You may wear deodorant.  Do not shave 48 hours prior to surgery.  Do not bring valuables to the hospital.  Contacts, dentures or bridgework may not be worn into surgery.  Leave suitcase in the car. After surgery it may be brought to your room.  For patients admitted to the hospital, checkout time is 11:00 AM the day of discharge.   Patients discharged the day of surgery will not be allowed to drive home.  :     Please read over the following fact sheets that you were given: Coughing and Deep Breathing, Surgical Site Infection Prevention, Anesthesia Post-op Instructions and Care and Recovery After Surgery    Cataract A cataract is a clouding of the lens of the eye. When a lens becomes cloudy, vision is reduced based on the degree and nature of the clouding. Many cataracts reduce vision to some degree. Some cataracts make people more near-sighted as they develop. Other cataracts increase glare. Cataracts that are ignored and become worse can sometimes look [place]. The Mccoll color can be seen through the pupil. CAUSES   Aging. However, cataracts may occur at any age, even in newborns.   Certain drugs.   Trauma to the eye.   Certain diseases such as diabetes.   Specific eye diseases such as chronic inflammation inside the eye or a sudden attack of a rare form of glaucoma.   Inherited or acquired medical problems.  SYMPTOMS   Gradual, progressive drop in vision in the affected eye.   Severe, rapid visual loss. This most often happens when trauma is the cause.  DIAGNOSIS  To detect a cataract, an  eye doctor examines the lens. Cataracts are best diagnosed with an exam of the eyes with the pupils enlarged (dilated) by drops.  TREATMENT  For an early cataract, vision may improve by using different eyeglasses or stronger lighting. If that does not help your vision, surgery is the only effective treatment. A cataract needs to be surgically removed when vision loss interferes with your everyday activities, such as driving, reading, or watching TV. A cataract may also have to be removed if it prevents examination or treatment of another eye problem. Surgery removes the cloudy lens and usually replaces it with a substitute lens (intraocular lens, IOL).  At a time when both you and your doctor agree, the cataract will be surgically removed. If you have cataracts in both eyes, only one is usually removed at a time. This allows the operated eye to heal and be out of danger from any possible problems after surgery (such as infection or poor wound healing). In rare cases, a cataract may be doing damage to your eye. In these cases, your caregiver may advise surgical removal right away. The vast majority of people who have cataract surgery have better vision afterward. HOME CARE INSTRUCTIONS  If you are not planning surgery, you may be asked to do the following:  Use different eyeglasses.   Use stronger or brighter lighting.   Ask your eye doctor about reducing your medicine dose  or changing medicines if it is thought that a medicine caused your cataract. Changing medicines does not make the cataract go away on its own.   Become familiar with your surroundings. Poor vision can lead to injury. Avoid bumping into things on the affected side. You are at a higher risk for tripping or falling.   Exercise extreme care when driving or operating machinery.   Wear sunglasses if you are sensitive to bright light or experiencing problems with glare.  SEEK IMMEDIATE MEDICAL CARE IF:   You have a worsening or sudden  vision loss.   You notice redness, swelling, or increasing pain in the eye.   You have a fever.  Document Released: 07/06/2005 Document Revised: 06/25/2011 Document Reviewed: 02/27/2011 [facility] Patient Information 2012 [place].PATIENT INSTRUCTIONS POST-ANESTHESIA  IMMEDIATELY FOLLOWING SURGERY:  Do not drive or operate machinery for the first twenty four hours after surgery.  Do not make any important decisions for twenty four hours after surgery or while taking narcotic pain medications or sedatives.  If you develop intractable nausea and vomiting or a severe headache please notify your doctor immediately.  FOLLOW-UP:  Please make an appointment with your surgeon as instructed. You do not need to follow up with anesthesia unless specifically instructed to do so.  WOUND CARE INSTRUCTIONS (if applicable):  Keep a dry clean dressing on the anesthesia/puncture wound site if there is drainage.  Once the wound has quit draining you may leave it open to air.  Generally you should leave the bandage intact for twenty four hours unless there is drainage.  If the epidural site drains for more than 36-48 hours please call the anesthesia department.  QUESTIONS?:  Please feel free to call your physician or the hospital operator if you have any questions, and they will be happy to assist you.

## 2015-12-23 ENCOUNTER — Encounter (HOSPITAL_COMMUNITY): Payer: Self-pay | Admitting: *Deleted

## 2015-12-23 ENCOUNTER — Ambulatory Visit (HOSPITAL_COMMUNITY): Payer: Medicare Other | Admitting: Anesthesiology

## 2015-12-23 ENCOUNTER — Ambulatory Visit (HOSPITAL_COMMUNITY)
Admission: RE | Admit: 2015-12-23 | Discharge: 2015-12-23 | Disposition: A | Discharge status: Home / Self Care | Payer: Medicare Other | Source: Ambulatory Visit | Attending: Ophthalmology | Admitting: Ophthalmology

## 2015-12-23 ENCOUNTER — Encounter (HOSPITAL_COMMUNITY)
Admission: RE | Disposition: A | Discharge status: Home / Self Care | Payer: Self-pay | Source: Ambulatory Visit | Attending: Ophthalmology

## 2015-12-23 DIAGNOSIS — Z79899 Other long term (current) drug therapy: Secondary | ICD-10-CM | POA: Insufficient documentation

## 2015-12-23 DIAGNOSIS — E039 Hypothyroidism, unspecified: Secondary | ICD-10-CM | POA: Diagnosis not present

## 2015-12-23 DIAGNOSIS — Z7982 Long term (current) use of aspirin: Secondary | ICD-10-CM | POA: Insufficient documentation

## 2015-12-23 DIAGNOSIS — I10 Essential (primary) hypertension: Secondary | ICD-10-CM | POA: Insufficient documentation

## 2015-12-23 DIAGNOSIS — H268 Other specified cataract: Secondary | ICD-10-CM | POA: Diagnosis present

## 2015-12-23 HISTORY — PX: CATARACT EXTRACTION W/PHACO: SHX586

## 2015-12-23 SURGERY — PHACOEMULSIFICATION, CATARACT, WITH IOL INSERTION
Anesthesia: Monitor Anesthesia Care | Laterality: Right

## 2015-12-23 MED ORDER — PHENYLEPHRINE-KETOROLAC 1-0.3 % IO SOLN
INTRAOCULAR | Status: DC | PRN
  Administered 2015-12-23: 500 mL via OPHTHALMIC

## 2015-12-23 MED ORDER — POVIDONE-IODINE 5 % OP SOLN
OPHTHALMIC | Status: DC | PRN
  Administered 2015-12-23: 1 via OPHTHALMIC

## 2015-12-23 MED ORDER — CYCLOPENTOLATE-PHENYLEPHRINE 0.2-1 % OP SOLN
1.0000 [drp] | OPHTHALMIC | Status: AC
  Administered 2015-12-23 (×3): 1 [drp] via OPHTHALMIC

## 2015-12-23 MED ORDER — FENTANYL CITRATE (PF) 100 MCG/2ML IJ SOLN
25.0000 ug | Freq: Once | INTRAMUSCULAR | Status: AC
  Administered 2015-12-23: 25 ug via INTRAVENOUS

## 2015-12-23 MED ORDER — CYCLOPENTOLATE-PHENYLEPHRINE OP SOLN OPTIME - NO CHARGE
OPHTHALMIC | Status: AC
  Filled 2015-12-23: qty 2

## 2015-12-23 MED ORDER — LACTATED RINGERS IV SOLN
INTRAVENOUS | Status: DC
  Administered 2015-12-23: 09:00:00 via INTRAVENOUS

## 2015-12-23 MED ORDER — DEXAMETHASONE SODIUM PHOSPHATE 4 MG/ML IJ SOLN
4.0000 mg | Freq: Once | INTRAMUSCULAR | Status: AC
  Administered 2015-12-23: 4 mg via INTRAVENOUS

## 2015-12-23 MED ORDER — TETRACAINE 0.5 % OP SOLN OPTIME - NO CHARGE
OPHTHALMIC | Status: DC | PRN
Start: 2015-12-23 — End: 2015-12-23
  Administered 2015-12-23: 2 [drp] via OPHTHALMIC

## 2015-12-23 MED ORDER — MIDAZOLAM HCL 2 MG/2ML IJ SOLN
1.0000 mg | INTRAMUSCULAR | Status: DC | PRN
  Administered 2015-12-23: 2 mg via INTRAVENOUS

## 2015-12-23 MED ORDER — LIDOCAINE HCL 3.5 % OP GEL
1.0000 "application " | Freq: Once | OPHTHALMIC | Status: DC
  Filled 2015-12-23: qty 1

## 2015-12-23 MED ORDER — DEXAMETHASONE SODIUM PHOSPHATE 4 MG/ML IJ SOLN
INTRAMUSCULAR | Status: AC
  Filled 2015-12-23: qty 1

## 2015-12-23 MED ORDER — TETRACAINE HCL 0.5 % OP SOLN
OPHTHALMIC | Status: AC
  Filled 2015-12-23: qty 4

## 2015-12-23 MED ORDER — MIDAZOLAM HCL 2 MG/2ML IJ SOLN
INTRAMUSCULAR | Status: AC
  Filled 2015-12-23: qty 2

## 2015-12-23 MED ORDER — BSS IO SOLN
INTRAOCULAR | Status: DC | PRN
  Administered 2015-12-23: 15 mL

## 2015-12-23 MED ORDER — FENTANYL CITRATE (PF) 100 MCG/2ML IJ SOLN
INTRAMUSCULAR | Status: AC
  Filled 2015-12-23: qty 2

## 2015-12-23 MED ORDER — ONDANSETRON HCL 4 MG/2ML IJ SOLN
4.0000 mg | Freq: Once | INTRAMUSCULAR | Status: AC
  Administered 2015-12-23: 4 mg via INTRAVENOUS

## 2015-12-23 MED ORDER — TETRACAINE HCL 0.5 % OP SOLN
1.0000 [drp] | OPHTHALMIC | Status: AC
  Administered 2015-12-23 (×3): 1 [drp] via OPHTHALMIC

## 2015-12-23 MED ORDER — ONDANSETRON HCL 4 MG/2ML IJ SOLN
INTRAMUSCULAR | Status: AC
  Filled 2015-12-23: qty 2

## 2015-12-23 MED ORDER — PHENYLEPHRINE-KETOROLAC 1-0.3 % IO SOLN
INTRAOCULAR | Status: AC
  Filled 2015-12-23: qty 4

## 2015-12-23 MED ORDER — NA HYALUR & NA CHOND-NA HYALUR 0.55-0.5 ML IO KIT
PACK | INTRAOCULAR | Status: DC | PRN
  Administered 2015-12-23: 1 via OPHTHALMIC

## 2015-12-23 MED ORDER — LIDOCAINE HCL 3.5 % OP GEL
OPHTHALMIC | Status: DC | PRN
  Administered 2015-12-23: 1 via OPHTHALMIC

## 2015-12-23 SURGICAL SUPPLY — 7 items
CLOTH BEACON ORANGE TIMEOUT ST (SAFETY) ×2 IMPLANT
GLOVE BIOGEL PI IND STRL 7.0 (GLOVE) IMPLANT
GLOVE BIOGEL PI INDICATOR 7.0 (GLOVE) ×4
INST SET CATARACT ~~LOC~~ (KITS) ×3 IMPLANT
LENS ALC ACRYL/TECN (Ophthalmic Related) ×3 IMPLANT
PAD ARMBOARD 7.5X6 YLW CONV (MISCELLANEOUS) ×2 IMPLANT
WATER STERILE IRR 250ML POUR (IV SOLUTION) ×2 IMPLANT

## 2015-12-23 NOTE — Transfer of Care (Signed)
Immediate Anesthesia Transfer of Care Note  Patient: [name]  Procedure(s) Performed: Procedure(s) (LRB): CATARACT EXTRACTION PHACO AND INTRAOCULAR LENS PLACEMENT RIGHT EYE CDE=6.42 (Right)  Patient Location: Shortstay  Anesthesia Type: MAC  Level of Consciousness: awake  Airway & Oxygen Therapy: Patient Spontanous Breathing   Post-op Assessment: Report given to PACU RN, Post -op Vital signs reviewed and stable and Patient moving all extremities  Post vital signs: Reviewed and stable  Complications: No apparent anesthesia complications

## 2015-12-23 NOTE — Brief Op Note (Signed)
12/23/2015  10:35 AM  PATIENT:  [name]  75 y.o. female  PRE-OPERATIVE DIAGNOSIS:  nuclear cataract right eye  POST-OPERATIVE DIAGNOSIS:  nuclear cataract right eye, difficulty performing daily activities  PROCEDURE:  Procedure(s): CATARACT EXTRACTION PHACO AND INTRAOCULAR LENS PLACEMENT RIGHT EYE CDE=6.42  SURGEON:  Surgeon(s): Williams Che, MD  ASSISTANTS:   Murlean Caller   ANESTHESIA STAFF: Anesthesiologist: Lerry Liner, MD CRNA: Vista Deck, CRNA  ANESTHESIA:   topical and MAC  REQUESTED LENS POWER: 17.0  LENS IMPLANT INFORMATION:   Alcon SN60WF  S/n [id number]  Exp 10/2019  CUMULATIVE DISSIPATED ENERGY:6.42  INDICATIONS:see scanned office H&P for details  OP FINDINGS:mod dense and psc  COMPLICATIONS:None  DICTATION #: none  PLAN OF CARE: as above  PATIENT DISPOSITION:  Short Stay

## 2015-12-23 NOTE — Anesthesia Procedure Notes (Signed)
Procedure Name: MAC Date/Time: 12/23/2015 9:31 AM Performed by: Vista Deck Pre-anesthesia Checklist: Patient identified, Emergency Drugs available, Suction available, Timeout performed and Patient being monitored Patient Re-evaluated:Patient Re-evaluated prior to inductionOxygen Delivery Method: Nasal Cannula

## 2015-12-23 NOTE — Anesthesia Preprocedure Evaluation (Addendum)
Anesthesia Evaluation  Patient identified by MRN, date of birth, ID band Patient awake    Reviewed: Allergy & Precautions, NPO status , Patient's Chart, lab work & pertinent test results  History of Anesthesia Complications (+) PONV and history of anesthetic complications  Airway Mallampati: I  TM Distance: >3 FB     Dental  (+) Upper Dentures, Lower Dentures   Pulmonary    breath sounds clear to auscultation       Cardiovascular hypertension, Pt. on medications  Rhythm:Regular Rate:Normal     Neuro/Psych    GI/Hepatic negative GI ROS,   Endo/Other  Hypothyroidism   Renal/GU      Musculoskeletal   Abdominal   Peds  Hematology   Anesthesia Other Findings   Reproductive/Obstetrics                            Anesthesia Physical Anesthesia Plan  ASA: II  Anesthesia Plan: MAC   Post-op Pain Management:    Induction: Intravenous  Airway Management Planned: Nasal Cannula  Additional Equipment:   Intra-op Plan:   Post-operative Plan:   Informed Consent: I have reviewed the patients History and Physical, chart, labs and discussed the procedure including the risks, benefits and alternatives for the proposed anesthesia with the patient or authorized representative who has indicated his/her understanding and acceptance.     Plan Discussed with:   Anesthesia Plan Comments:         Anesthesia Quick Evaluation

## 2015-12-23 NOTE — Op Note (Signed)
12/23/2015  10:35 AM  PATIENT:  [name]  75 y.o. female  PRE-OPERATIVE DIAGNOSIS:  nuclear cataract right eye  POST-OPERATIVE DIAGNOSIS:  nuclear cataract right eye, difficulty performing daily activities  PROCEDURE:  Procedure(s): CATARACT EXTRACTION PHACO AND INTRAOCULAR LENS PLACEMENT RIGHT EYE CDE=6.42  SURGEON:  Surgeon(s): Williams Che, MD  ASSISTANTS:   Murlean Caller   ANESTHESIA STAFF: Anesthesiologist: Lerry Liner, MD CRNA: Vista Deck, CRNA  ANESTHESIA:   topical and MAC  REQUESTED LENS POWER: 17.0  LENS IMPLANT INFORMATION:   Alcon SN60WF  S/n [id number]  Exp 10/2019  CUMULATIVE DISSIPATED ENERGY:  6.42  INDICATIONS:  see scanned office H&P for details  OP FINDINGS:  mod dense and psc  COMPLICATIONS:  None  PROCEDURE:  The patient was brought to the operating room in good condition.  The operative eye was prepped and draped in the usual fashion for intraocular surgery.  Lidocaine gel was dropped onto the eye.  A 2.4 mm 10 O'clock near clear corneal stepped incision and a 12 O'clock stab incision were created.  Viscoat was instilled into the anterior chamber.  The 5 mm anterior capsulorhexis was performed with a bent needle cystotome and Utrata forceps.  The lens was hydrodissected and hydrodelineated with a cannula and balanced salt solution and rotated with a Kuglen hook.  Phacoemulsification was perfomed in the divide and conquer technique.  The remaining cortex was removed with I&A and the capsular surfaces polished as necessary.  Provisc was placed into the capsular bag and the lens inserted with the Alcon inserter.  The viscoelastic was removed with I&A and the lens "rocked" into position.  The wounds were hydrated and te anterior chamber was refilled with balanced salt solution.  The wounds were checked for leakage and rehydrated as necessary.  The lid speculum and drapes were removed and the patient was transported to short stay in good  condition.  PATIENT DISPOSITION:  Short Stay

## 2015-12-23 NOTE — H&P (Signed)
I have reviewed the pre printed H&P, the patient was re-examined, and I have identified no significant interval changes in the patient's medical condition.  There is no change in the plan of care since the history and physical of record. 

## 2015-12-23 NOTE — Anesthesia Postprocedure Evaluation (Signed)
  Anesthesia Post-op Note  Patient: [name]  Procedure(s) Performed: Procedure(s) (LRB): CATARACT EXTRACTION PHACO AND INTRAOCULAR LENS PLACEMENT RIGHT EYE CDE=6.42 (Right)  Patient Location:  Short Stay  Anesthesia Type: MAC  Level of Consciousness: awake  Airway and Oxygen Therapy: Patient Spontanous Breathing  Post-op Pain: none  Post-op Assessment: Post-op Vital signs reviewed, Patient's Cardiovascular Status Stable, Respiratory Function Stable, Patent Airway, No signs of Nausea or vomiting and Pain level controlled  Post-op Vital Signs: Reviewed and stable  Complications: No apparent anesthesia complications

## 2015-12-25 ENCOUNTER — Encounter (HOSPITAL_COMMUNITY): Payer: Self-pay | Admitting: Ophthalmology

## 2016-08-31 ENCOUNTER — Ambulatory Visit (INDEPENDENT_AMBULATORY_CARE_PROVIDER_SITE_OTHER): Payer: Medicare Other | Admitting: Family Medicine

## 2016-08-31 ENCOUNTER — Encounter: Payer: Self-pay | Admitting: Family Medicine

## 2016-08-31 VITALS — BP 132/88 | HR 64 | Temp 97.8°F | Resp 18 | Ht 64.0 in | Wt 190.1 lb

## 2016-08-31 DIAGNOSIS — K5909 Other constipation: Secondary | ICD-10-CM

## 2016-08-31 DIAGNOSIS — Z96653 Presence of artificial knee joint, bilateral: Secondary | ICD-10-CM

## 2016-08-31 DIAGNOSIS — R194 Change in bowel habit: Secondary | ICD-10-CM | POA: Diagnosis not present

## 2016-08-31 DIAGNOSIS — Z7689 Persons encountering health services in other specified circumstances: Secondary | ICD-10-CM | POA: Diagnosis not present

## 2016-08-31 DIAGNOSIS — G5600 Carpal tunnel syndrome, unspecified upper limb: Secondary | ICD-10-CM

## 2016-08-31 DIAGNOSIS — G5603 Carpal tunnel syndrome, bilateral upper limbs: Secondary | ICD-10-CM

## 2016-08-31 HISTORY — DX: Carpal tunnel syndrome, unspecified upper limb: G56.00

## 2016-08-31 LAB — COMPREHENSIVE METABOLIC PANEL
ALK PHOS: 80 U/L (ref 33–130)
ALT: 13 U/L (ref 6–29)
AST: 20 U/L (ref 10–35)
Albumin: 4.1 g/dL (ref 3.6–5.1)
BILIRUBIN TOTAL: 0.4 mg/dL (ref 0.2–1.2)
BUN: 25 mg/dL (ref 7–25)
CALCIUM: 9.4 mg/dL (ref 8.6–10.4)
CO2: 25 mmol/L (ref 20–31)
Chloride: 104 mmol/L (ref 98–110)
Creat: 0.84 mg/dL (ref 0.60–0.93)
GLUCOSE: 92 mg/dL (ref 65–99)
Potassium: 4.4 mmol/L (ref 3.5–5.3)
Sodium: 139 mmol/L (ref 135–146)
TOTAL PROTEIN: 6.9 g/dL (ref 6.1–8.1)

## 2016-08-31 LAB — CBC
HCT: 42.7 % (ref 35.0–45.0)
HEMOGLOBIN: 14.1 g/dL (ref 11.7–15.5)
MCH: 28.7 pg (ref 27.0–33.0)
MCHC: 33 g/dL (ref 32.0–36.0)
MCV: 86.8 fL (ref 80.0–100.0)
MPV: 9.7 fL (ref 7.5–12.5)
Platelets: 202 10*3/uL (ref 140–400)
RBC: 4.92 MIL/uL (ref 3.80–5.10)
RDW: 14.7 % (ref 11.0–15.0)
WBC: 6.4 10*3/uL (ref 3.8–10.8)

## 2016-08-31 LAB — LIPID PANEL
CHOLESTEROL: 237 mg/dL — AB (ref <200)
HDL: 43 mg/dL — AB (ref >50)
LDL Cholesterol: 159 mg/dL — ABNORMAL HIGH (ref <100)
Total CHOL/HDL Ratio: 5.5 Ratio — ABNORMAL HIGH (ref <5.0)
Triglycerides: 176 mg/dL — ABNORMAL HIGH (ref <150)
VLDL: 35 mg/dL — ABNORMAL HIGH (ref <30)

## 2016-08-31 LAB — TSH: TSH: 2.94 m[IU]/L

## 2016-08-31 NOTE — Patient Instructions (Signed)
Need old records  Need blood tests  See me for a physical   Need mammogram in June  Try to walk every day

## 2016-08-31 NOTE — Progress Notes (Signed)
Chief Complaint  Patient presents with  . Establish Care   New to establish  PCP records requested EPIC chart reviewed Mammo normal in 6 - 16, due this year No need for PAP Colo normal in 10/2013 with diverticulosis and no polyp Due for labs Has had flu shot but not prevnar or shingles or recent tetanus  Has a "list" 1. Fatigue 2. Overweight to start weight watchers 3. L arm looks smaller than R 4. bilat hand numbness 5. Chronic constipation   Patient Active Problem List   Diagnosis Date Noted  . Knee joint replacement status, bilateral 08/31/2016  . Chronic constipation 08/31/2016  . CTS (carpal tunnel syndrome) 08/31/2016  . Hyperlipidemia 04/19/2015  . Peripheral arterial disease ([place]) 04/19/2015  . DJD (degenerative joint disease) of knee 04/04/2014  . Hypertension   . Hypothyroidism   . Recurrent urinary tract infection   . Rectal bleeding 10/31/2013  . Change in bowel habits 10/31/2013  . FH: colon cancer 10/31/2013    Outpatient Encounter Prescriptions as of 08/31/2016  Medication Sig  . aspirin EC 81 MG tablet Take 81 mg by mouth daily.  . Cyanocobalamin (VITAMIN B 12 PO) Take by mouth.  . docusate sodium (COLACE) 100 MG capsule Take 100 mg by mouth 2 (two) times daily as needed for mild constipation.  Marland Kitchen levothyroxine (SYNTHROID, LEVOTHROID) 75 MCG tablet Take 75 mcg by mouth daily before breakfast.  . lisinopril (PRINIVIL,ZESTRIL) 10 MG tablet Take 10 mg by mouth at bedtime.   . magnesium hydroxide (MILK OF MAGNESIA) 400 MG/5ML suspension Take 60 mLs by mouth daily as needed for mild constipation.  Marland Kitchen MEGARED OMEGA-3 KRILL OIL PO Take by mouth.  . polyethylene glycol (MIRALAX / GLYCOLAX) packet 17grams in 16 oz of water twice a day until bowel movement.  LAXITIVE.  Restart if two days since last bowel movement   No facility-administered encounter medications on file as of 08/31/2016.     Past Medical History:  Diagnosis Date  . Allergy   . Anemia    prior to hysterectomy  . Back pain    comepensating for knee pain per pt  . Cataract    right eye and immature  . Complication of anesthesia   . Constipation    takes Colace daily and Milk of Mag  . CTS (carpal tunnel syndrome) 08/31/2016  . Dizziness    but goes away real quick  . Dry eyes    uses Eye Drops daily as needed  . GERD (gastroesophageal reflux disease)   . History of colon polyps   . Hyperlipidemia    was on meds but stopped taking back in June 2015  . Hypertension    takes Lisinopril daily  . Hypothyroidism    takes Synthroid daily  . Joint pain   . Peripheral arterial disease ([place])   . Pneumonia    as a baby  . PONV (postoperative nausea and vomiting)   . Primary localized osteoarthritis of left knee 04/17/2015  . Primary localized osteoarthritis of right knee    knees  . Urinary frequency   . Urinary urgency     Past Surgical History:  Procedure Laterality Date  . ABDOMINAL HYSTERECTOMY  90's   complete  . APPENDECTOMY  80's  . BACK SURGERY    . CATARACT EXTRACTION W/PHACO Right 12/23/2015   Procedure: CATARACT EXTRACTION PHACO AND INTRAOCULAR LENS PLACEMENT RIGHT EYE CDE=6.42;  Surgeon: Williams Che, MD;  Location: [facility];  Service: Ophthalmology;  Laterality:  Right;  Marland Kitchen COLONOSCOPY  09/09/2004   [id number] rectum and colon  . COLONOSCOPY N/A 11/08/2013   Procedure: COLONOSCOPY;  Surgeon: Daneil Dolin, MD;  Location: [facility];  Service: Endoscopy;  Laterality: N/A;  10:45  . JOINT REPLACEMENT     2015, 2016  . NECK SURGERY  1983  . RECTOCELE REPAIR  10/2007  . SPINE SURGERY     ruptured disc  . TOTAL KNEE ARTHROPLASTY Right 04/04/2014   DR Noemi Chapel  . TOTAL KNEE ARTHROPLASTY Right 04/04/2014   Procedure: RIGHT TOTAL KNEE ARTHROPLASTY;  Surgeon: Lorn Junes, MD;  Location: [place];  Service: Orthopedics;  Laterality: Right;  . TOTAL KNEE ARTHROPLASTY Left 04/29/2015   Procedure: TOTAL KNEE ARTHROPLASTY;  Surgeon: Elsie Saas, MD;  Location:  [place];  Service: Orthopedics;  Laterality: Left;  . TUBAL LIGATION      Social History   Social History  . Marital status: Widowed    Spouse name: N/A  . Number of children: 2  . Years of education: 13   Occupational History  . retired     Doctor, general practice   Social History Main Topics  . Smoking status: Never Smoker  . Smokeless tobacco: Never Used  . Alcohol use No  . Drug use: No  . Sexual activity: No   Other Topics Concern  . Not on file   Social History Narrative   widowed   Lives with a companion   He has end stage liver disease   Son and daughter in law moving in          Family History  Problem Relation Age of Onset  . Colon cancer Sister     age 23s  . Myasthenia gravis Sister   . Bladder Cancer Brother   . Lung cancer Brother     age 40  . Alzheimer's disease Mother   . COPD Father   . Asthma Father   . Alcohol abuse Brother     likely died of pneumonia    Review of Systems  Constitutional: Positive for malaise/fatigue. Negative for chills, fever and weight loss.  HENT: Negative for congestion and hearing loss.   Eyes: Negative for blurred vision and pain.  Respiratory: Negative for cough and shortness of breath.   Cardiovascular: Negative for chest pain and leg swelling.  Gastrointestinal: Positive for constipation. Negative for abdominal pain, diarrhea and heartburn.  Genitourinary: Negative for dysuria and frequency.       Some incontinence  Musculoskeletal: Negative for falls, joint pain and myalgias.  Neurological: Positive for sensory change. Negative for dizziness, seizures and headaches.       Numbness hands  Psychiatric/Behavioral: Negative for depression. The patient is not nervous/anxious and does not have insomnia.     BP 132/88   Pulse 64   Temp 97.8 F (36.6 C) (Oral)   Resp 18   Ht 5\' 4"  (1.626 m)   Wt 190 lb 1.9 oz (86.2 kg)   LMP 07/20/1990 (Approximate)   SpO2 96%   BMI 32.63 kg/m   Physical Exam    Constitutional: She is oriented to person, place, and time. She appears well-developed and well-nourished.  Overweight.  talkative  HENT:  Head: Normocephalic and atraumatic.  Mouth/Throat: Oropharynx is clear and moist.  Eyes: Conjunctivae are normal. Pupils are equal, round, and reactive to light.  Neck: Normal range of motion.  Cardiovascular: Normal rate, regular rhythm and normal heart sounds.   Pulmonary/Chest: Effort normal and breath sounds  normal.  Musculoskeletal: Normal range of motion. She exhibits no edema.  Both knees with ant arthroscopy scars  Lymphadenopathy:    She has no cervical adenopathy.  Neurological: She is alert and oriented to person, place, and time.  Skin: Skin is warm and dry.  Psychiatric: She has a normal mood and affect. Her behavior is normal.   ASSESSMENT/PLAN:  1. Knee joint replacement status, bilateral  2. Chronic constipation  3. Change in bowel habits  4. Bilateral carpal tunnel syndrome  5. Encounter to establish care with new doctor  - CBC - Comprehensive metabolic panel - Lipid panel - TSH - Urinalysis, Routine w reflex microscopic - VITAMIN D 25 Hydroxy (Vit-D Deficiency, Fractures)   Patient Instructions  Need old records  Need blood tests  See me for a physical   Need mammogram in June  Try to walk every day   Raylene Everts, MD

## 2016-09-01 ENCOUNTER — Encounter: Payer: Self-pay | Admitting: Family Medicine

## 2016-09-01 LAB — VITAMIN D 25 HYDROXY (VIT D DEFICIENCY, FRACTURES): VIT D 25 HYDROXY: 15 ng/mL — AB (ref 30–100)

## 2016-09-01 LAB — URINALYSIS, ROUTINE W REFLEX MICROSCOPIC
Bilirubin Urine: NEGATIVE
Glucose, UA: NEGATIVE
Hgb urine dipstick: NEGATIVE
Ketones, ur: NEGATIVE
LEUKOCYTES UA: NEGATIVE
Nitrite: NEGATIVE
PROTEIN: NEGATIVE
Specific Gravity, Urine: 1.022 (ref 1.001–1.035)
pH: 5.5 (ref 5.0–8.0)

## 2016-09-28 ENCOUNTER — Ambulatory Visit (INDEPENDENT_AMBULATORY_CARE_PROVIDER_SITE_OTHER): Payer: Medicare Other | Admitting: Family Medicine

## 2016-09-28 ENCOUNTER — Encounter: Payer: Self-pay | Admitting: Family Medicine

## 2016-09-28 VITALS — BP 134/84 | HR 62 | Temp 98.4°F | Resp 16 | Ht 64.0 in | Wt 192.0 lb

## 2016-09-28 DIAGNOSIS — I739 Peripheral vascular disease, unspecified: Secondary | ICD-10-CM | POA: Diagnosis not present

## 2016-09-28 DIAGNOSIS — E559 Vitamin D deficiency, unspecified: Secondary | ICD-10-CM | POA: Diagnosis not present

## 2016-09-28 DIAGNOSIS — Z6832 Body mass index (BMI) 32.0-32.9, adult: Secondary | ICD-10-CM

## 2016-09-28 DIAGNOSIS — E6609 Other obesity due to excess calories: Secondary | ICD-10-CM

## 2016-09-28 DIAGNOSIS — Z0000 Encounter for general adult medical examination without abnormal findings: Secondary | ICD-10-CM | POA: Diagnosis not present

## 2016-09-28 DIAGNOSIS — Z23 Encounter for immunization: Secondary | ICD-10-CM | POA: Diagnosis not present

## 2016-09-28 DIAGNOSIS — E785 Hyperlipidemia, unspecified: Secondary | ICD-10-CM

## 2016-09-28 DIAGNOSIS — I10 Essential (primary) hypertension: Secondary | ICD-10-CM

## 2016-09-28 MED ORDER — ROSUVASTATIN CALCIUM 10 MG PO TABS: 10.0000 mg | ORAL_TABLET | Freq: Every day | ORAL | 3 refills | Status: DC

## 2016-09-28 NOTE — Patient Instructions (Signed)
Maurice!  Walk every day that you are able  prevnar 13 (pneumonia shot today)  Start the crestor - rosuvastatin - in a few days Take in the evening  Continue the vitamin D supplement  Need to have lab work , and return  in 3 months    DO NOT LET ANY [place]

## 2016-09-28 NOTE — Progress Notes (Signed)
Chief Complaint  Patient presents with  . Annual Exam   Here for PE Discussed recent lab tests Is taking a vitamin D supplement Is amenable to starting a statin. Discussed daily use, side effects, risks vs benefits.   Has not yet started her diet and is advised to go ahead and do this. Discussed seeing a dermatologist for skin lesions face and abdomen ( [place] )    Patient Active Problem List   Diagnosis Date Noted  . Vitamin D deficiency 09/28/2016  . Knee joint replacement status, bilateral 08/31/2016  . Chronic constipation 08/31/2016  . CTS (carpal tunnel syndrome) 08/31/2016  . Hyperlipidemia 04/19/2015  . Peripheral arterial disease ([place]) 04/19/2015  . DJD (degenerative joint disease) of knee 04/04/2014  . Hypertension   . Hypothyroidism   . Recurrent urinary tract infection   . Rectal bleeding 10/31/2013  . FH: colon cancer 10/31/2013    Outpatient Encounter Prescriptions as of 09/28/2016  Medication Sig  . aspirin EC 81 MG tablet Take 81 mg by mouth daily.  . cholecalciferol (VITAMIN D) 1000 units tablet Take 2,000 Units by mouth daily.  . Cyanocobalamin (VITAMIN B 12 PO) Take by mouth.  . docusate sodium (COLACE) 100 MG capsule Take 100 mg by mouth 2 (two) times daily as needed for mild constipation.  Marland Kitchen levothyroxine (SYNTHROID, LEVOTHROID) 75 MCG tablet Take 75 mcg by mouth daily before breakfast.  . lisinopril (PRINIVIL,ZESTRIL) 10 MG tablet Take 10 mg by mouth at bedtime.   . magnesium hydroxide (MILK OF MAGNESIA) 400 MG/5ML suspension Take 60 mLs by mouth daily as needed for mild constipation.  . polyethylene glycol (MIRALAX / GLYCOLAX) packet 17grams in 16 oz of water twice a day until bowel movement.  LAXITIVE.  Restart if two days since last bowel movement  . [DISCONTINUED] MEGARED OMEGA-3 KRILL OIL PO Take by mouth.  . rosuvastatin (CRESTOR) 10 MG tablet Take 1 tablet (10 mg total) by mouth daily.   No facility-administered encounter medications on file as  of 09/28/2016.     Allergies  Allergen Reactions  . Codeine Nausea And Vomiting  . Flexall [Menthol (Topical Analgesic)] Rash  . Penicillins Rash    Review of Systems  Constitutional: Positive for unexpected weight change. Negative for activity change, appetite change and fatigue.       Gain 2 lbs  HENT: Negative for congestion and dental problem.   Eyes: Negative for photophobia and visual disturbance.  Respiratory: Negative for cough and shortness of breath.   Cardiovascular: Positive for leg swelling. Negative for chest pain and palpitations.  Gastrointestinal: Negative for blood in stool, constipation and diarrhea.  Genitourinary: Negative for difficulty urinating and vaginal bleeding.  Musculoskeletal: Positive for arthralgias and back pain.  Skin: Positive for color change.  Neurological: Positive for numbness. Negative for dizziness and headaches.  Psychiatric/Behavioral: Negative for dysphoric mood. The patient is not nervous/anxious.       Physical Exam   BP 134/84 (BP Location: Right Arm, Patient Position: Sitting, Cuff Size: Normal)   Pulse 62   Temp 98.4 F (36.9 C) (Temporal)   Resp 16   Ht 5\' 4"  (1.626 m)   Wt 192 lb 0.6 oz (87.1 kg)   LMP 07/20/1990 (Approximate)   SpO2 97%   BMI 32.96 kg/m   General Appearance:    Alert, cooperative, no distress, appears stated age.  Overweight  Head:    Normocephalic, without obvious abnormality, atraumatic  Eyes:    PERRL, conjunctiva/corneas clear, EOM's intact,  fundi    benign, both eyes. Large keratosis  R upper eye lid  Ears:    Normal TM's and external ear canals, both ears  Nose:   Nares normal, septum midline, mucosa normal, no drainage    or sinus tenderness  Throat:   Lips, mucosa, and tongue normal; denture and gums normal  Neck:   Supple, symmetrical, trachea midline, no adenopathy;    thyroid:  no enlargement/tenderness/nodules; no carotid   bruit   Back:     Symmetric, no curvature, ROM normal, no CVA  tenderness  Lungs:     Clear to auscultation bilaterally, respirations unlabored  Chest Wall:    No tenderness or deformity   Heart:    Regular rate and rhythm, S1 and S2 normal, no murmur, rub   or gallop  Breast Exam:    No tenderness, masses, or nipple abnormality  Abdomen:     Soft, non-tender, bowel sounds active all four quadrants,    no masses, no organomegaly  Extremities:   Extremities normal, atraumatic, no cyanosis or edema.  well healed kee arthroplasty scars  Pulses:   2+ and symmetric at wrist, 1+ in feet at [facility]  Skin:   Skin color, texture, turgor normal, no rashes , multiple seb keratoses trunk.  No pigmented lesions  Lymph nodes:   Cervical, supraclavicular, and axillary nodes normal  Neurologic:   CNII-XII intact, normal strength, sensation and reflexes    throughout     ASSESSMENT/PLAN:  1. Vitamin D deficiency  - VITAMIN D 25 Hydroxy (Vit-D Deficiency, Fractures)  2. Hyperlipidemia, unspecified hyperlipidemia type  - Lipid panel  3. Annual physical exam No unexpected findings - MM Digital Screening; Future   Patient Instructions  [place]!  Walk every day that you are able  prevnar 13 (pneumonia shot today)  Start the crestor - rosuvastatin - in a few days Take in the evening  Continue the vitamin D supplement  Need to have lab work , and return  in 3 months    DO NOT [place] LOVED ONES   Raylene Everts, MD

## 2016-10-13 ENCOUNTER — Other Ambulatory Visit: Payer: Self-pay | Admitting: Family Medicine

## 2016-10-13 NOTE — Telephone Encounter (Signed)
Patient is asking for a refill on her levothyroxine (SYNTHROID, LEVOTHROID) 75 MCG tablet please advise?

## 2016-10-14 MED ORDER — LEVOTHYROXINE SODIUM 75 MCG PO TABS: 75.0000 ug | ORAL_TABLET | Freq: Every day | ORAL | 3 refills | Status: DC

## 2016-10-14 NOTE — Telephone Encounter (Signed)
Done

## 2016-10-15 ENCOUNTER — Ambulatory Visit (INDEPENDENT_AMBULATORY_CARE_PROVIDER_SITE_OTHER): Payer: Medicare Other | Admitting: Family Medicine

## 2016-10-15 ENCOUNTER — Encounter: Payer: Self-pay | Admitting: Family Medicine

## 2016-10-15 VITALS — BP 136/78 | HR 72 | Temp 96.4°F | Resp 16 | Ht 64.0 in | Wt 191.0 lb

## 2016-10-15 DIAGNOSIS — M5030 Other cervical disc degeneration, unspecified cervical region: Secondary | ICD-10-CM | POA: Diagnosis not present

## 2016-10-15 DIAGNOSIS — M62532 Muscle wasting and atrophy, not elsewhere classified, left forearm: Secondary | ICD-10-CM

## 2016-10-15 DIAGNOSIS — M79645 Pain in left finger(s): Secondary | ICD-10-CM | POA: Diagnosis not present

## 2016-10-15 DIAGNOSIS — G5603 Carpal tunnel syndrome, bilateral upper limbs: Secondary | ICD-10-CM | POA: Diagnosis not present

## 2016-10-15 DIAGNOSIS — M47812 Spondylosis without myelopathy or radiculopathy, cervical region: Secondary | ICD-10-CM | POA: Insufficient documentation

## 2016-10-15 DIAGNOSIS — M79632 Pain in left forearm: Secondary | ICD-10-CM | POA: Diagnosis not present

## 2016-10-15 NOTE — Progress Notes (Signed)
Chief Complaint  Patient presents with  . Hand Pain    left   Here for acute visit She has history of bilateral carpal tunnel syndrome for at least 10 years Before that she had nerve pain in her right arm and had cervical disc surgery for 'pinched nerve" Now she has a couple of upper extremity complaints Her left thumb is painful and looks swollen   Her left forearm is smaller than right and sometimes hurts.  The skin on her left forearm has started to pucker and "looks like breast cancer".  No trauma or over use.  Is right handed. Both hands wake her up multiple times a night. Her Right arm has pain from shoulder to forearm.  No numbness or weakness. Right thumb area smaller than left. Chronic mild neck stiffness.   Patient Active Problem List   Diagnosis Date Noted  . DJD (degenerative joint disease), cervical 10/15/2016  . Vitamin D deficiency 09/28/2016  . Class 1 obesity due to excess calories without serious comorbidity with body mass index (BMI) of 32.0 to 32.9 in adult 09/28/2016  . Knee joint replacement status, bilateral 08/31/2016  . Chronic constipation 08/31/2016  . CTS (carpal tunnel syndrome) 08/31/2016  . Hyperlipidemia 04/19/2015  . Peripheral arterial disease ([place]) 04/19/2015  . DJD (degenerative joint disease) of knee 04/04/2014  . Hypertension   . Hypothyroidism   . Recurrent urinary tract infection   . Rectal bleeding 10/31/2013  . FH: colon cancer 10/31/2013    Outpatient Encounter Prescriptions as of 10/15/2016  Medication Sig  . aspirin EC 81 MG tablet Take 81 mg by mouth daily.  . cholecalciferol (VITAMIN D) 1000 units tablet Take 2,000 Units by mouth daily.  . Cyanocobalamin (VITAMIN B 12 PO) Take by mouth.  . docusate sodium (COLACE) 100 MG capsule Take 100 mg by mouth 2 (two) times daily as needed for mild constipation.  Marland Kitchen levothyroxine (SYNTHROID, LEVOTHROID) 75 MCG tablet Take 1 tablet (75 mcg total) by mouth daily.  Marland Kitchen lisinopril  (PRINIVIL,ZESTRIL) 10 MG tablet Take 10 mg by mouth at bedtime.   . magnesium hydroxide (MILK OF MAGNESIA) 400 MG/5ML suspension Take 60 mLs by mouth daily as needed for mild constipation.  . polyethylene glycol (MIRALAX / GLYCOLAX) packet 17grams in 16 oz of water twice a day until bowel movement.  LAXITIVE.  Restart if two days since last bowel movement  . rosuvastatin (CRESTOR) 10 MG tablet Take 1 tablet (10 mg total) by mouth daily.   No facility-administered encounter medications on file as of 10/15/2016.     Allergies  Allergen Reactions  . Codeine Nausea And Vomiting  . Flexall [Menthol (Topical Analgesic)] Rash  . Penicillins Rash    Review of Systems  Constitutional: Negative for activity change, appetite change and unexpected weight change.  HENT: Negative for congestion and hearing loss.   Eyes: Negative for photophobia and visual disturbance.  Respiratory: Negative for cough and shortness of breath.   Cardiovascular: Negative for chest pain, palpitations and leg swelling.  Gastrointestinal: Negative for constipation and diarrhea.  Musculoskeletal: Positive for arthralgias, joint swelling, neck pain and neck stiffness.  Neurological: Positive for weakness and numbness.  Psychiatric/Behavioral: Positive for sleep disturbance.  see HPI   BP 136/78 (BP Location: Right Arm, Patient Position: Sitting, Cuff Size: Normal)   Pulse 72   Temp (!) 96.4 F (35.8 C) (Temporal)   Resp 16   Ht 5\' 4"  (1.626 m)   Wt 191 lb (86.6 kg)  LMP 07/20/1990 (Approximate)   SpO2 95%   BMI 32.79 kg/m   Physical Exam  Constitutional: She is oriented to person, place, and time. She appears well-developed and well-nourished. No distress.  HENT:  Head: Normocephalic and atraumatic.  Mouth/Throat: Oropharynx is clear and moist.  Eyes:  Mild eye asymmetry.  Neck: Normal range of motion. Neck supple.  Full ROM.  No tenderness.  Well healed posterior scar  Cardiovascular: Normal rate, regular  rhythm and normal heart sounds.   Pulmonary/Chest: Effort normal and breath sounds normal.  Musculoskeletal:  Neck exam normal. Right shoulder has limited rotation in the abducted position.  Right extremity otherwise unremarkable except thenar muslce wasting and pos phalens.  Left upper est shows normal shoulder and elbow exam, wasting of forearm muscles with slight peau de orange look to skin. Thumb had tenderness over MCP joint.  phalen pos  Neurological: She is alert and oriented to person, place, and time. She displays normal reflexes. No sensory deficit. Coordination normal.  Skin: Skin is warm and dry.  Psychiatric: She has a normal mood and affect. Her behavior is normal.    Discussed neck DJD, need for nerve cond test to see about the CTS AND the left forearm.  Need x ray of thumb.  Mild rotator cuff tendinitis of right shoulder  ASSESSMENT/PLAN:  1. Thumb pain, left  - DG Hand Complete Left; Future - DG Forearm Left; Future  2. Atrophy of muscle of left forearm  - DG Hand Complete Left; Future - DG Forearm Left; Future  3. Pain in left forearm  - DG Hand Complete Left; Future - DG Forearm Left; Future  4. Bilateral carpal tunnel syndrome   5. DJD (degenerative joint disease), cervical    Patient Instructions  Get x rays of thumb and forearm Consider nerve test of arms and hands/neurology I will notify you of your test results   Raylene Everts, MD

## 2016-10-15 NOTE — Patient Instructions (Signed)
Get x rays of thumb and forearm Consider nerve test of arms and hands/neurology I will notify you of your test results

## 2016-10-21 ENCOUNTER — Ambulatory Visit (HOSPITAL_COMMUNITY)
Admission: RE | Admit: 2016-10-21 | Discharge: 2016-10-21 | Disposition: A | Discharge status: Home / Self Care | Payer: Medicare Other | Source: Ambulatory Visit | Attending: Family Medicine | Admitting: Family Medicine

## 2016-10-21 DIAGNOSIS — M79645 Pain in left finger(s): Secondary | ICD-10-CM | POA: Insufficient documentation

## 2016-10-21 DIAGNOSIS — M62532 Muscle wasting and atrophy, not elsewhere classified, left forearm: Secondary | ICD-10-CM | POA: Insufficient documentation

## 2016-10-21 DIAGNOSIS — M79632 Pain in left forearm: Secondary | ICD-10-CM | POA: Insufficient documentation

## 2016-10-27 ENCOUNTER — Encounter: Payer: Self-pay | Admitting: Family Medicine

## 2016-11-09 ENCOUNTER — Ambulatory Visit (INDEPENDENT_AMBULATORY_CARE_PROVIDER_SITE_OTHER): Payer: Medicare Other

## 2016-11-09 VITALS — BP 118/78 | HR 62 | Temp 97.6°F | Ht 64.0 in | Wt 188.0 lb

## 2016-11-09 DIAGNOSIS — Z0000 Encounter for general adult medical examination without abnormal findings: Secondary | ICD-10-CM | POA: Diagnosis not present

## 2016-11-09 NOTE — Patient Instructions (Addendum)
[name] , Thank you for taking time to come for your Medicare Wellness Visit. I appreciate your ongoing commitment to your health goals. Please review the following plan we discussed and let me know if I can assist you in the future.   Screening recommendations/referrals: Colonoscopy: Up to date, next due 10/2023  Mammogram: Will be due 12/2016 Bone Density: Up to date Recommended yearly ophthalmology/optometry visit for glaucoma screening and checkup Recommended yearly dental visit for hygiene and checkup  Vaccinations: Influenza vaccine: Up to date, next due 04/2017 Pneumococcal vaccine: Up to date, next due 09/2017 Tdap vaccine: Overdue, declines  Shingles vaccine: Overdue, declines    Advanced directives: Advance directive discussed with you today. I have provided a copy for you to complete at home and have notarized. Once this is complete please bring a copy in to our office so we can scan it into your chart.  Conditions/risks identified: Obese, recommend starting a routine exercise program at least 3 days a week for 30-45 minutes at a time as tolerated.   Next appointment: Follow up with Dr. Meda Coffee on 01/04/2017 at 10:40 am. Follow up in 1 year for your annual wellness visit.   Preventive Care 74 Years and Older, Female Preventive care refers to lifestyle choices and visits with your health care provider that can promote health and wellness. What does preventive care include?  A yearly physical exam. This is also called an annual well check.  Dental exams once or twice a year.  Routine eye exams. Ask your health care provider how often you should have your eyes checked.  Personal lifestyle choices, including:  Daily care of your teeth and gums.  Regular physical activity.  Eating a healthy diet.  Avoiding tobacco and drug use.  Limiting alcohol use.  Practicing safe sex.  Taking low-dose aspirin every day.  Taking vitamin and mineral supplements as recommended by  your health care provider. What happens during an annual well check? The services and screenings done by your health care provider during your annual well check will depend on your age, overall health, lifestyle risk factors, and family history of disease. Counseling  Your health care provider may ask you questions about your:  Alcohol use.  Tobacco use.  Drug use.  Emotional well-being.  Home and relationship well-being.  Sexual activity.  Eating habits.  History of falls.  Memory and ability to understand (cognition).  Work and work Statistician.  Reproductive health. Screening  You may have the following tests or measurements:  Height, weight, and BMI.  Blood pressure.  Lipid and cholesterol levels. These may be checked every 5 years, or more frequently if you are over 68 years old.  Skin check.  Lung cancer screening. You may have this screening every year starting at age 74 if you have a 30-pack-year history of smoking and currently smoke or have quit within the past 15 years.  Fecal occult blood test (FOBT) of the stool. You may have this test every year starting at age 74.  Flexible sigmoidoscopy or colonoscopy. You may have a sigmoidoscopy every 5 years or a colonoscopy every 10 years starting at age 74.  Hepatitis C blood test.  Hepatitis B blood test.  Sexually transmitted disease (STD) testing.  Diabetes screening. This is done by checking your blood sugar (glucose) after you have not eaten for a while (fasting). You may have this done every 1-3 years.  Bone density scan. This is done to screen for osteoporosis. You may have this  done starting at age 74.  Mammogram. This may be done every 1-2 years. Talk to your health care provider about how often you should have regular mammograms. Talk with your health care provider about your test results, treatment options, and if necessary, the need for more tests. Vaccines  Your health care provider may  recommend certain vaccines, such as:  Influenza vaccine. This is recommended every year.  Tetanus, diphtheria, and acellular pertussis (Tdap, Td) vaccine. You may need a Td booster every 10 years.  Zoster vaccine. You may need this after age 74.  Pneumococcal 13-valent conjugate (PCV13) vaccine. One dose is recommended after age 74.  Pneumococcal polysaccharide (PPSV23) vaccine. One dose is recommended after age 74. Talk to your health care provider about which screenings and vaccines you need and how often you need them. This information is not intended to replace advice given to you by your health care provider. Make sure you discuss any questions you have with your health care provider. Document Released: 08/02/2015 Document Revised: 03/25/2016 Document Reviewed: 05/07/2015 Elsevier Interactive Patient Education  2017 [place] Prevention in the Home Falls can cause injuries. They can happen to people of all ages. There are many things you can do to make your home safe and to help prevent falls. What can I do on the outside of my home?  Regularly fix the edges of walkways and driveways and fix any cracks.  Remove anything that might make you trip as you walk through a door, such as a raised step or threshold.  Trim any bushes or trees on the path to your home.  Use bright outdoor lighting.  Clear any walking paths of anything that might make someone trip, such as rocks or tools.  Regularly check to see if handrails are loose or broken. Make sure that both sides of any steps have handrails.  Any raised decks and porches should have guardrails on the edges.  Have any leaves, snow, or ice cleared regularly.  Use sand or salt on walking paths during winter.  Clean up any spills in your garage right away. This includes oil or grease spills. What can I do in the bathroom?  Use night lights.  Install grab bars by the toilet and in the tub and shower. Do not use towel  bars as grab bars.  Use non-skid mats or decals in the tub or shower.  If you need to sit down in the shower, use a plastic, non-slip stool.  Keep the floor dry. Clean up any water that spills on the floor as soon as it happens.  Remove soap buildup in the tub or shower regularly.  Attach bath mats securely with double-sided non-slip rug tape.  Do not have throw rugs and other things on the floor that can make you trip. What can I do in the bedroom?  Use night lights.  Make sure that you have a light by your bed that is easy to reach.  Do not use any sheets or blankets that are too big for your bed. They should not hang down onto the floor.  Have a firm chair that has side arms. You can use this for support while you get dressed.  Do not have throw rugs and other things on the floor that can make you trip. What can I do in the kitchen?  Clean up any spills right away.  Avoid walking on wet floors.  Keep items that you use a lot in easy-to-reach places.  If you need to reach something above you, use a strong step stool that has a grab bar.  Keep electrical cords out of the way.  Do not use floor polish or wax that makes floors slippery. If you must use wax, use non-skid floor wax.  Do not have throw rugs and other things on the floor that can make you trip. What can I do with my stairs?  Do not leave any items on the stairs.  Make sure that there are handrails on both sides of the stairs and use them. Fix handrails that are broken or loose. Make sure that handrails are as long as the stairways.  Check any carpeting to make sure that it is firmly attached to the stairs. Fix any carpet that is loose or worn.  Avoid having throw rugs at the top or bottom of the stairs. If you do have throw rugs, attach them to the floor with carpet tape.  Make sure that you have a light switch at the top of the stairs and the bottom of the stairs. If you do not have them, ask someone to  add them for you. What else can I do to help prevent falls?  Wear shoes that:  Do not have high heels.  Have rubber bottoms.  Are comfortable and fit you well.  Are closed at the toe. Do not wear sandals.  If you use a stepladder:  Make sure that it is fully opened. Do not climb a closed stepladder.  Make sure that both sides of the stepladder are locked into place.  Ask someone to hold it for you, if possible.  Clearly mark and make sure that you can see:  Any grab bars or handrails.  First and last steps.  Where the edge of each step is.  Use tools that help you move around (mobility aids) if they are needed. These include:  Canes.  Walkers.  Scooters.  Crutches.  Turn on the lights when you go into a dark area. Replace any light bulbs as soon as they burn out.  Set up your furniture so you have a clear path. Avoid moving your furniture around.  If any of your floors are uneven, fix them.  If there are any pets around you, be aware of where they are.  Review your medicines with your doctor. Some medicines can make you feel dizzy. This can increase your chance of falling. Ask your doctor what other things that you can do to help prevent falls. This information is not intended to replace advice given to you by your health care provider. Make sure you discuss any questions you have with your health care provider. Document Released: 05/02/2009 Document Revised: 12/12/2015 Document Reviewed: 08/10/2014 Elsevier Interactive Patient Education  2017 [employer].

## 2016-11-09 NOTE — Progress Notes (Signed)
Subjective:   [name] is a 76 y.o. female who presents for Medicare Annual (Subsequent) preventive examination.  Review of Systems:  Cardiac Risk Factors include: advanced age (>53men, >58 women);dyslipidemia;hypertension;obesity (BMI >30kg/m2);sedentary lifestyle     Objective:     Vitals: BP 118/78   Pulse 62   Temp 97.6 F (36.4 C) (Oral)   Ht 5\' 4"  (1.626 m)   Wt 188 lb 0.6 oz (85.3 kg)   LMP 07/20/1990 (Approximate)   SpO2 97%   BMI 32.28 kg/m   Body mass index is 32.28 kg/m.   Tobacco History  Smoking Status  . Never Smoker  Smokeless Tobacco  . Never Used     Counseling given: Not Answered   Past Medical History:  Diagnosis Date  . Allergy   . Anemia    prior to hysterectomy  . Back pain    comepensating for knee pain per pt  . Cataract    right eye and immature  . Complication of anesthesia   . Constipation    takes Colace daily and Milk of Mag  . CTS (carpal tunnel syndrome) 08/31/2016  . Dizziness    but goes away real quick  . Dry eyes    uses Eye Drops daily as needed  . GERD (gastroesophageal reflux disease)   . History of colon polyps   . Hyperlipidemia    was on meds but stopped taking back in June 2015  . Hypertension    takes Lisinopril daily  . Hypothyroidism    takes Synthroid daily  . Joint pain   . Peripheral arterial disease ([place])   . Pneumonia    as a baby  . PONV (postoperative nausea and vomiting)   . Primary localized osteoarthritis of left knee 04/17/2015  . Primary localized osteoarthritis of right knee    knees  . Urinary frequency   . Urinary urgency    Past Surgical History:  Procedure Laterality Date  . ABDOMINAL HYSTERECTOMY  90's   complete  . APPENDECTOMY  80's  . BACK SURGERY    . CATARACT EXTRACTION W/PHACO Right 12/23/2015   Procedure: CATARACT EXTRACTION PHACO AND INTRAOCULAR LENS PLACEMENT RIGHT EYE CDE=6.42;  Surgeon: Williams Che, MD;  Location: [facility];  Service: Ophthalmology;  Laterality:  Right;  . COLONOSCOPY  09/09/2004   VEL:FYBOFB rectum and colon  . COLONOSCOPY N/A 11/08/2013   Procedure: COLONOSCOPY;  Surgeon: Daneil Dolin, MD;  Location: [facility];  Service: Endoscopy;  Laterality: N/A;  10:45  . JOINT REPLACEMENT     2015, 2016  . NECK SURGERY  1983  . RECTOCELE REPAIR  10/2007  . SPINE SURGERY     ruptured disc  . TOTAL KNEE ARTHROPLASTY Right 04/04/2014   DR Noemi Chapel  . TOTAL KNEE ARTHROPLASTY Right 04/04/2014   Procedure: RIGHT TOTAL KNEE ARTHROPLASTY;  Surgeon: Lorn Junes, MD;  Location: [place];  Service: Orthopedics;  Laterality: Right;  . TOTAL KNEE ARTHROPLASTY Left 04/29/2015   Procedure: TOTAL KNEE ARTHROPLASTY;  Surgeon: Elsie Saas, MD;  Location: [place];  Service: Orthopedics;  Laterality: Left;  . TUBAL LIGATION     Family History  Problem Relation Age of Onset  . Colon cancer Sister     age 57s  . Myasthenia gravis Sister   . Bladder Cancer Brother   . Lung cancer Brother     age 68  . Alzheimer's disease Mother   . COPD Father   . Asthma Father   .  Alcohol abuse Brother     likely died of pneumonia   History  Sexual Activity  . Sexual activity: No    Outpatient Encounter Prescriptions as of 11/09/2016  Medication Sig  . aspirin EC 81 MG tablet Take 81 mg by mouth daily.  . cholecalciferol (VITAMIN D) 1000 units tablet Take 2,000 Units by mouth daily.  . Cyanocobalamin (VITAMIN B 12 PO) Take 1 tablet by mouth daily.   Marland Kitchen docusate sodium (COLACE) 100 MG capsule Take 100 mg by mouth daily.   Marland Kitchen levothyroxine (SYNTHROID, LEVOTHROID) 75 MCG tablet Take 1 tablet (75 mcg total) by mouth daily.  Marland Kitchen lisinopril (PRINIVIL,ZESTRIL) 10 MG tablet Take 10 mg by mouth at bedtime.   . magnesium hydroxide (MILK OF MAGNESIA) 400 MG/5ML suspension Take 60 mLs by mouth daily as needed for mild constipation.  . polyethylene glycol (MIRALAX / GLYCOLAX) packet 17grams in 16 oz of water twice a day until bowel movement.  LAXITIVE.  Restart if two days  since last bowel movement (Patient taking differently: 2 (two) times daily as needed. 17grams in 16 oz of water twice a day until bowel movement.  LAXITIVE.  Restart if two days since last bowel movement)  . rosuvastatin (CRESTOR) 10 MG tablet Take 1 tablet (10 mg total) by mouth daily.   No facility-administered encounter medications on file as of 11/09/2016.     Activities of Daily Living In your present state of health, do you have any difficulty performing the following activities: 11/09/2016 08/31/2016  Hearing? N N  Vision? N N  Difficulty concentrating or making decisions? Y N  Walking or climbing stairs? N N  Dressing or bathing? N N  Doing errands, shopping? N N  Preparing Food and eating ? N -  Using the Toilet? N -  In the past six months, have you accidently leaked urine? Y -  Do you have problems with loss of bowel control? N -  Managing your Medications? N -  Managing your Finances? N -  Housekeeping or managing your Housekeeping? N -  Some recent data might be hidden    Patient Care Team: Raylene Everts, MD as PCP - General (Family Medicine) Daneil Dolin, MD as Consulting Physician (Gastroenterology)    Assessment:    Exercise Activities and Dietary recommendations Current Exercise Habits: The patient does not participate in regular exercise at present, Exercise limited by: None identified  Goals    . Blood Pressure < 140/90    . Exercise 3x per week (30 min per time)          Obese, recommend starting a routine exercise program at least 3 days a week for 30-45 minutes at a time as tolerated.      . Weight (lb) < 160 lb (72.6 kg)      Fall Risk Fall Risk  11/09/2016 08/31/2016  Falls in the past year? No No   Depression Screen PHQ 2/9 Scores 11/09/2016 08/31/2016  PHQ - 2 Score 0 0     Cognitive Function: Normal MMSE - Mini Mental State Exam 11/09/2016  Orientation to time 5  Orientation to Place 5  Registration 3  Attention/ Calculation 5  Recall  3  Language- name 2 objects 2  Language- repeat 1  Language- follow 3 step command 3  Language- read & follow direction 1  Write a sentence 1  Copy design 1  Total score 30        Immunization History  Administered Date(s) Administered  .  Influenza, High Dose Seasonal PF 04/20/2016  . Pneumococcal Conjugate-13 09/28/2016   Screening Tests Health Maintenance  Topic Date Due  . TETANUS/TDAP  05/01/1962  . MAMMOGRAM  01/13/2017  . INFLUENZA VACCINE  02/17/2017  . PNA vac Low Risk Adult (2 of 2 - PPSV23) 09/28/2017  . COLONOSCOPY  11/09/2023  . DEXA SCAN  Completed      Plan:  I have personally reviewed and addressed the Medicare Annual Wellness questionnaire and have noted the following in the patient's chart:  A. Medical and social history B. Use of alcohol, tobacco or illicit drugs  C. Current medications and supplements D. Functional ability and status E.  Nutritional status F.  Physical activity G. Advance directives H. List of other physicians I.  Hospitalizations, surgeries, and ER visits in previous 12 months J.  [place] to include cognitive, depression, and falls L. Referrals and appointments - none  In addition, I have reviewed and discussed with patient certain preventive protocols, quality metrics, and best practice recommendations. A written personalized care plan for preventive services as well as general preventive health recommendations were provided to patient.  Signed,   Stormy Fabian, LPN Lead Nurse Health Advisor

## 2016-11-12 ENCOUNTER — Ambulatory Visit: Payer: Medicare Other

## 2016-11-19 ENCOUNTER — Telehealth: Payer: Self-pay | Admitting: Family Medicine

## 2016-11-19 MED ORDER — LISINOPRIL 10 MG PO TABS: 10.0000 mg | ORAL_TABLET | Freq: Every day | ORAL | 3 refills | Status: DC

## 2016-11-19 NOTE — Telephone Encounter (Signed)
Done

## 2016-11-19 NOTE — Telephone Encounter (Signed)
Patient requesting refill on her Lisinopril 10mg .  Please call in @ [facility].  Last filled by Earleen Newport, PA @ [facility].

## 2016-12-10 ENCOUNTER — Other Ambulatory Visit: Payer: Self-pay | Admitting: Family Medicine

## 2016-12-10 ENCOUNTER — Ambulatory Visit (HOSPITAL_COMMUNITY)
Admission: RE | Admit: 2016-12-10 | Discharge: 2016-12-10 | Disposition: A | Discharge status: Home / Self Care | Payer: Medicare Other | Source: Ambulatory Visit | Attending: Family Medicine | Admitting: Family Medicine

## 2016-12-10 DIAGNOSIS — Z1231 Encounter for screening mammogram for malignant neoplasm of breast: Secondary | ICD-10-CM | POA: Diagnosis present

## 2016-12-28 LAB — LIPID PANEL
CHOL/HDL RATIO: 6.3 ratio — AB (ref <5.0)
CHOLESTEROL: 214 mg/dL — AB (ref <200)
HDL: 34 mg/dL — ABNORMAL LOW (ref >50)
LDL CALC: 122 mg/dL — AB (ref <100)
TRIGLYCERIDES: 292 mg/dL — AB (ref <150)
VLDL: 58 mg/dL — AB (ref <30)

## 2016-12-28 LAB — VITAMIN D 25 HYDROXY (VIT D DEFICIENCY, FRACTURES): Vit D, 25-Hydroxy: 37 ng/mL (ref 30–100)

## 2017-01-04 ENCOUNTER — Ambulatory Visit (INDEPENDENT_AMBULATORY_CARE_PROVIDER_SITE_OTHER): Payer: Medicare Other | Admitting: Family Medicine

## 2017-01-04 ENCOUNTER — Encounter: Payer: Self-pay | Admitting: Family Medicine

## 2017-01-04 VITALS — BP 136/82 | HR 64 | Temp 96.7°F | Resp 16 | Ht 64.0 in | Wt 188.1 lb

## 2017-01-04 DIAGNOSIS — I10 Essential (primary) hypertension: Secondary | ICD-10-CM | POA: Diagnosis not present

## 2017-01-04 DIAGNOSIS — E039 Hypothyroidism, unspecified: Secondary | ICD-10-CM

## 2017-01-04 DIAGNOSIS — E785 Hyperlipidemia, unspecified: Secondary | ICD-10-CM | POA: Diagnosis not present

## 2017-01-04 DIAGNOSIS — K5909 Other constipation: Secondary | ICD-10-CM | POA: Diagnosis not present

## 2017-01-04 DIAGNOSIS — E559 Vitamin D deficiency, unspecified: Secondary | ICD-10-CM | POA: Diagnosis not present

## 2017-01-04 NOTE — Progress Notes (Signed)
Chief Complaint  Patient presents with  . Follow-up    3 month/ lab rev  routine follow up Discussed her recent labs She stopped her crestor after about a week because she "hurt all over'  And "could not get out of bed" Does not want to try a different statin as she has had this reaction to more than one. She is still not on her diet, but tells me again she intends to She is still not exercising, and is reminded to We discussed her chronic constipation - she is not good about water or fiber in her diet or regular use of a fiber/miralax additive.  Then after not going to the BR for 4-5 days feels awful and uses a laxative.  We discussed this is NOT good management of her chronic constipation. Her thumb pain is better - we discussed her x rays show advanced arthritis   Patient Active Problem List   Diagnosis Date Noted  . DJD (degenerative joint disease), cervical 10/15/2016  . Vitamin D deficiency 09/28/2016  . Class 1 obesity due to excess calories without serious comorbidity with body mass index (BMI) of 32.0 to 32.9 in adult 09/28/2016  . Knee joint replacement status, bilateral 08/31/2016  . Chronic constipation 08/31/2016  . CTS (carpal tunnel syndrome) 08/31/2016  . Hyperlipidemia 04/19/2015  . Peripheral arterial disease ([place]) 04/19/2015  . DJD (degenerative joint disease) of knee 04/04/2014  . Hypertension   . Hypothyroidism   . Recurrent urinary tract infection   . Rectal bleeding 10/31/2013  . FH: colon cancer 10/31/2013    Outpatient Encounter Prescriptions as of 01/04/2017  Medication Sig  . aspirin EC 81 MG tablet Take 81 mg by mouth daily.  . cholecalciferol (VITAMIN D) 1000 units tablet Take 2,000 Units by mouth daily.  . Cyanocobalamin (VITAMIN B 12 PO) Take 1 tablet by mouth daily.   Marland Kitchen docusate sodium (COLACE) 100 MG capsule Take 100 mg by mouth daily.   Marland Kitchen levothyroxine (SYNTHROID, LEVOTHROID) 75 MCG tablet Take 1 tablet (75 mcg total) by mouth daily.  Marland Kitchen  lisinopril (PRINIVIL,ZESTRIL) 10 MG tablet Take 1 tablet (10 mg total) by mouth at bedtime.  . magnesium hydroxide (MILK OF MAGNESIA) 400 MG/5ML suspension Take 60 mLs by mouth daily as needed for mild constipation.  . polyethylene glycol (MIRALAX / GLYCOLAX) packet 17grams in 16 oz of water twice a day until bowel movement.  LAXITIVE.  Restart if two days since last bowel movement (Patient taking differently: 2 (two) times daily as needed. 17grams in 16 oz of water twice a day until bowel movement.  LAXITIVE.  Restart if two days since last bowel movement)  . [DISCONTINUED] rosuvastatin (CRESTOR) 10 MG tablet Take 1 tablet (10 mg total) by mouth daily. (Patient not taking: Reported on 01/04/2017)   No facility-administered encounter medications on file as of 01/04/2017.     Allergies  Allergen Reactions  . Codeine Nausea And Vomiting  . Crestor [Rosuvastatin] Other (See Comments)    Body aches  . Flexall [Menthol (Topical Analgesic)] Rash  . Penicillins Rash    Review of Systems  Constitutional: Negative for activity change, appetite change and unexpected weight change.  HENT: Negative for congestion and dental problem.   Eyes: Negative for photophobia and visual disturbance.  Respiratory: Negative for cough, choking and shortness of breath.   Cardiovascular: Negative for chest pain, palpitations and leg swelling.  Gastrointestinal: Positive for abdominal distention, abdominal pain and constipation.  Genitourinary: Negative for difficulty urinating  and dysuria.  Musculoskeletal: Positive for arthralgias and back pain.  Skin: Negative for color change and rash.  Neurological: Negative for dizziness and facial asymmetry.  Psychiatric/Behavioral: Negative.  Negative for behavioral problems and decreased concentration.    BP 136/82 (BP Location: Right Arm, Patient Position: Sitting, Cuff Size: Normal)   Pulse 64   Temp (!) 96.7 F (35.9 C) (Temporal)   Resp 16   Ht 5\' 4"  (1.626 m)    Wt 188 lb 1.9 oz (85.3 kg)   LMP 07/20/1990 (Approximate)   SpO2 95%   BMI 32.29 kg/m   Physical Exam  ASSESSMENT/PLAN:  1. Essential hypertension controlled - CBC - Lipid panel - Urinalysis, Routine w reflex microscopic  2. Chronic constipation discussed  3. Hyperlipidemia, unspecified hyperlipidemia type Statin intolerant - COMPLETE METABOLIC PANEL WITH GFR  4. Vitamin D deficiency Supplements discussed  5. Hypothyroidism, unspecified type On replacement - TSH   Patient Instructions  Watch the diet, try to reduce the cholesterol  Walk every day that you are able  Try metamucil daily for the constipation  I agree with your decision to lose weight  Need blood work and follow in six months     Raylene Everts, MD

## 2017-01-04 NOTE — Patient Instructions (Signed)
Watch the diet, try to reduce the cholesterol  Walk every day that you are able  Try metamucil daily for the constipation  I agree with your decision to lose weight  Need blood work and follow in six months

## 2017-06-25 ENCOUNTER — Encounter: Payer: Self-pay | Admitting: Family Medicine

## 2017-06-25 LAB — URINALYSIS, ROUTINE W REFLEX MICROSCOPIC
Bilirubin Urine: NEGATIVE
Glucose, UA: NEGATIVE
Hgb urine dipstick: NEGATIVE
Ketones, ur: NEGATIVE
LEUKOCYTES UA: NEGATIVE
Nitrite: NEGATIVE
PROTEIN: NEGATIVE
Specific Gravity, Urine: 1.009 (ref 1.001–1.03)
pH: 5.5 (ref 5.0–8.0)

## 2017-06-25 LAB — COMPLETE METABOLIC PANEL WITH GFR
AG Ratio: 1.4 (calc) (ref 1.0–2.5)
ALKALINE PHOSPHATASE (APISO): 76 U/L (ref 33–130)
ALT: 16 U/L (ref 6–29)
AST: 21 U/L (ref 10–35)
Albumin: 4 g/dL (ref 3.6–5.1)
BUN/Creatinine Ratio: 23 (calc) — ABNORMAL HIGH (ref 6–22)
BUN: 23 mg/dL (ref 7–25)
CALCIUM: 9.6 mg/dL (ref 8.6–10.4)
CO2: 32 mmol/L (ref 20–32)
CREATININE: 1.02 mg/dL — AB (ref 0.60–0.93)
Chloride: 104 mmol/L (ref 98–110)
GFR, Est African American: 63 mL/min/{1.73_m2} (ref >=60)
GFR, Est Non African American: 54 mL/min/{1.73_m2} — ABNORMAL LOW (ref >=60)
GLUCOSE: 99 mg/dL (ref 65–99)
Globulin: 2.9 g/dL (calc) (ref 1.9–3.7)
Potassium: 5.5 mmol/L — ABNORMAL HIGH (ref 3.5–5.3)
Sodium: 143 mmol/L (ref 135–146)
Total Bilirubin: 0.4 mg/dL (ref 0.2–1.2)
Total Protein: 6.9 g/dL (ref 6.1–8.1)

## 2017-06-25 LAB — CBC
HEMATOCRIT: 41.2 % (ref 35.0–45.0)
HEMOGLOBIN: 14.1 g/dL (ref 11.7–15.5)
MCH: 29.3 pg (ref 27.0–33.0)
MCHC: 34.2 g/dL (ref 32.0–36.0)
MCV: 85.7 fL (ref 80.0–100.0)
MPV: 10.2 fL (ref 7.5–12.5)
Platelets: 238 10*3/uL (ref 140–400)
RBC: 4.81 10*6/uL (ref 3.80–5.10)
RDW: 13.5 % (ref 11.0–15.0)
WBC: 6.7 10*3/uL (ref 3.8–10.8)

## 2017-06-25 LAB — LIPID PANEL
CHOL/HDL RATIO: 5.9 (calc) — AB (ref <5.0)
CHOLESTEROL: 254 mg/dL — AB (ref <200)
HDL: 43 mg/dL — AB (ref >50)
LDL CHOLESTEROL (CALC): 164 mg/dL — AB
Non-HDL Cholesterol (Calc): 211 mg/dL (calc) — ABNORMAL HIGH (ref <130)
TRIGLYCERIDES: 304 mg/dL — AB (ref <150)

## 2017-06-25 LAB — TSH: TSH: 7.73 mIU/L — ABNORMAL HIGH (ref 0.40–4.50)

## 2017-07-02 ENCOUNTER — Encounter: Payer: Self-pay | Admitting: Family Medicine

## 2017-07-02 ENCOUNTER — Ambulatory Visit (INDEPENDENT_AMBULATORY_CARE_PROVIDER_SITE_OTHER): Payer: Medicare Other | Admitting: Family Medicine

## 2017-07-02 ENCOUNTER — Other Ambulatory Visit: Payer: Self-pay

## 2017-07-02 VITALS — BP 138/90 | HR 60 | Temp 96.2°F | Resp 16 | Ht 64.0 in | Wt 196.1 lb

## 2017-07-02 DIAGNOSIS — I10 Essential (primary) hypertension: Secondary | ICD-10-CM

## 2017-07-02 DIAGNOSIS — B372 Candidiasis of skin and nail: Secondary | ICD-10-CM | POA: Diagnosis not present

## 2017-07-02 DIAGNOSIS — Z23 Encounter for immunization: Secondary | ICD-10-CM

## 2017-07-02 DIAGNOSIS — E039 Hypothyroidism, unspecified: Secondary | ICD-10-CM

## 2017-07-02 DIAGNOSIS — K5909 Other constipation: Secondary | ICD-10-CM | POA: Diagnosis not present

## 2017-07-02 DIAGNOSIS — E785 Hyperlipidemia, unspecified: Secondary | ICD-10-CM

## 2017-07-02 MED ORDER — LISINOPRIL-HYDROCHLOROTHIAZIDE 10-12.5 MG PO TABS: 1.0000 | ORAL_TABLET | Freq: Every day | ORAL | 3 refills | Status: DC

## 2017-07-02 MED ORDER — LEVOTHYROXINE SODIUM 100 MCG PO TABS: 100.0000 ug | ORAL_TABLET | Freq: Every day | ORAL | 3 refills | Status: DC

## 2017-07-02 MED ORDER — NYSTATIN 100000 UNIT/GM EX POWD: Freq: Two times a day (BID) | CUTANEOUS | 3 refills | Status: DC

## 2017-07-02 MED ORDER — NYSTATIN 100000 UNIT/GM EX CREA: 1.0000 "application " | TOPICAL_CREAM | Freq: Two times a day (BID) | CUTANEOUS | 1 refills | Status: DC

## 2017-07-02 MED ORDER — PRAVASTATIN SODIUM 20 MG PO TABS: 20.0000 mg | ORAL_TABLET | Freq: Every day | ORAL | 3 refills | Status: DC

## 2017-07-02 NOTE — Progress Notes (Addendum)
Chief Complaint  Patient presents with  . Follow-up    6 month   Patient is here for follow-up.  She brings with her a list of things to discuss. She has chronic constipation.  She takes MiraLAX as needed.  She does not drink enough water.  We spent some time discussing diet, fiber, fluids, exercise, and the proper use of stool softeners to prevent constipation and manage her bowels. She also has a rash in her groin area.  It is persistent for months.  It stings and burns and itches.  She did like this looked at. Her blood pressure is elevated.  She is gained weight.  We discussed the low-salt DASH diet.  We will going to add hydrochlorothiazide to her regimen.  The symptoms of visual change, headache, nausea.  She does not check her blood pressure at home.  Patient had blood work done last week.  Her TSH is over 7.  She needs an increase in her thyroid hormone replacement.  She has been feeling tired.  I noted her weight gain.  I noted her constipation.  These may improve with the higher dose of Synthroid.  Her cholesterol is elevated.  Total cholesterol 255 and a LDL of 164.  Her Framingham risk of developing heart disease over the next 10 years is 25%.  We discussed that she needs to be back on a statin.  She did not tolerate Crestor due to body aches.  I will try her on pravastatin.  She does not have any known diagnosis of cerebrovascular disease or coronary artery disease.    Patient Active Problem List   Diagnosis Date Noted  . DJD (degenerative joint disease), cervical 10/15/2016  . Vitamin D deficiency 09/28/2016  . Class 1 obesity due to excess calories without serious comorbidity with body mass index (BMI) of 32.0 to 32.9 in adult 09/28/2016  . Knee joint replacement status, bilateral 08/31/2016  . Chronic constipation 08/31/2016  . CTS (carpal tunnel syndrome) 08/31/2016  . Hyperlipidemia 04/19/2015  . Peripheral arterial disease ([place]) 04/19/2015  . DJD (degenerative joint  disease) of knee 04/04/2014  . Hypertension   . Hypothyroidism   . Recurrent urinary tract infection   . Rectal bleeding 10/31/2013  . FH: colon cancer 10/31/2013    Outpatient Encounter Medications as of 07/02/2017  Medication Sig  . aspirin EC 81 MG tablet Take 81 mg by mouth daily.  . cholecalciferol (VITAMIN D) 1000 units tablet Take 2,000 Units by mouth daily.  . Cyanocobalamin (VITAMIN B 12 PO) Take 1 tablet by mouth daily.   Marland Kitchen docusate sodium (COLACE) 100 MG capsule Take 100 mg by mouth daily.   Marland Kitchen lisinopril (PRINIVIL,ZESTRIL) 10 MG tablet Take 1 tablet (10 mg total) by mouth at bedtime.  . Omega-3 Fatty Acids (FISH OIL) 1000 MG CAPS Take by mouth.  . polyethylene glycol (MIRALAX / GLYCOLAX) packet 17grams in 16 oz of water twice a day until bowel movement.  LAXITIVE.  Restart if two days since last bowel movement (Patient taking differently: 2 (two) times daily as needed. 17grams in 16 oz of water twice a day until bowel movement.  LAXITIVE.  Restart if two days since last bowel movement)  . [DISCONTINUED] levothyroxine (SYNTHROID, LEVOTHROID) 75 MCG tablet Take 1 tablet (75 mcg total) by mouth daily.  . [DISCONTINUED] magnesium hydroxide (MILK OF MAGNESIA) 400 MG/5ML suspension Take 60 mLs by mouth daily as needed for mild constipation.  Marland Kitchen levothyroxine (SYNTHROID, LEVOTHROID) 100 MCG tablet Take  1 tablet (100 mcg total) by mouth daily.  Marland Kitchen lisinopril-hydrochlorothiazide (PRINZIDE,ZESTORETIC) 10-12.5 MG tablet Take 1 tablet by mouth daily.  Marland Kitchen nystatin (MYCOSTATIN/NYSTOP) powder Apply topically 2 (two) times daily.  Marland Kitchen nystatin cream (MYCOSTATIN) Apply 1 application topically 2 (two) times daily.  . pravastatin (PRAVACHOL) 20 MG tablet Take 1 tablet (20 mg total) by mouth daily.   No facility-administered encounter medications on file as of 07/02/2017.     Allergies  Allergen Reactions  . Codeine Nausea And Vomiting  . Crestor [Rosuvastatin] Other (See Comments)    Body aches    . Flexall [Menthol (Topical Analgesic)] Rash  . Penicillins Rash    Review of Systems  Constitutional: Negative for activity change, appetite change and unexpected weight change.  HENT: Negative for congestion, dental problem, postnasal drip and rhinorrhea.   Eyes: Negative for redness and visual disturbance.  Respiratory: Negative for cough and shortness of breath.   Cardiovascular: Negative for chest pain, palpitations and leg swelling.  Gastrointestinal: Positive for abdominal distention and constipation. Negative for abdominal pain and diarrhea.  Genitourinary: Negative for difficulty urinating and frequency.  Musculoskeletal: Negative for arthralgias and back pain.  Skin: Positive for rash.  Neurological: Negative for dizziness and headaches.  Psychiatric/Behavioral: Negative for dysphoric mood and sleep disturbance. The patient is not nervous/anxious.     BP 138/90   Pulse 60   Temp (!) 96.2 F (35.7 C) (Temporal)   Resp 16   Ht 5\' 4"  (1.626 m)   Wt 196 lb 1.3 oz (88.9 kg)   LMP 07/20/1990 (Approximate)   SpO2 96%   BMI 33.66 kg/m   Physical Exam  Constitutional: She is oriented to person, place, and time. She appears well-developed and well-nourished.  Overweight.  talkative  HENT:  Head: Normocephalic and atraumatic.  Mouth/Throat: Oropharynx is clear and moist.  Eyes: Conjunctivae are normal. Pupils are equal, round, and reactive to light.  Neck: Normal range of motion.  Cardiovascular: Normal rate, regular rhythm and normal heart sounds.  Pulmonary/Chest: Effort normal and breath sounds normal.  Musculoskeletal: Normal range of motion. She exhibits no edema.  Both knees with ant arthroscopy scars  Lymphadenopathy:    She has no cervical adenopathy.  Neurological: She is alert and oriented to person, place, and time.  Skin: Skin is warm and dry.  Both sides of the groin has an erythematous rash with some fissuring of the skin, satellite lesions, consistent  with Candida  Psychiatric: She has a normal mood and affect. Her behavior is normal.    ASSESSMENT/PLAN:  1. Flu vaccine need Done - Flu Vaccine QUAD 36+ mos IM  2. Candidal intertrigo Discussed keeping area clean and dry.  Use of nystatin cream to clear rash.  Use of nystatin powder to prevent.  Weight loss.  3. Essential hypertension Discussed weight loss.  Salt restriction.  Adding hydrochlorothiazide - CBC - COMPLETE METABOLIC PANEL WITH GFR - Urinalysis, Routine w reflex microscopic  4. Chronic constipation Discussed proper bowel regimen and habits  5. Hypothyroidism, unspecified type Increased Synthroid.  Will check TSH in 8-12 weeks - TSH  6. Hyperlipidemia, unspecified hyperlipidemia type Increased cholesterol.  High cardiovascular risk.  Add pravastatin - Lipid panel  Greater than 50% of this visit was spent in counseling and coordinating care.  Total face to face time:  45 min   Patient Instructions  Stop the fish oil Take the pravastatin every day Stop the plain lisinopril Stake the lisinopril with HCTZ (fluid pill) Use the nystatin  2x a day until rash clears After this use the powder daily to prevent Stop the synthroid 70 Start synthroid 100 daily Drink more water Try a regular dose of miralax  Need blood work and check up in 3 months   Raylene Everts, MD

## 2017-07-02 NOTE — Patient Instructions (Signed)
Stop the fish oil Take the pravastatin every day Stop the plain lisinopril Stake the lisinopril with HCTZ (fluid pill) Use the nystatin 2x a day until rash clears After this use the powder daily to prevent Stop the synthroid 70 Start synthroid 100 daily Drink more water Try a regular dose of miralax  Need blood work and check up in 3 months

## 2017-07-06 ENCOUNTER — Ambulatory Visit: Payer: Medicare Other | Admitting: Family Medicine

## 2017-08-04 ENCOUNTER — Encounter: Payer: Self-pay | Admitting: Family Medicine

## 2017-09-23 LAB — COMPLETE METABOLIC PANEL WITH GFR
AG RATIO: 1.6 (calc) (ref 1.0–2.5)
ALBUMIN MSPROF: 4.1 g/dL (ref 3.6–5.1)
ALT: 14 U/L (ref 6–29)
AST: 19 U/L (ref 10–35)
Alkaline phosphatase (APISO): 79 U/L (ref 33–130)
BUN/Creatinine Ratio: 22 (calc) (ref 6–22)
BUN: 21 mg/dL (ref 7–25)
CALCIUM: 9.4 mg/dL (ref 8.6–10.4)
CO2: 26 mmol/L (ref 20–32)
Chloride: 101 mmol/L (ref 98–110)
Creat: 0.97 mg/dL — ABNORMAL HIGH (ref 0.60–0.93)
GFR, EST AFRICAN AMERICAN: 67 mL/min/{1.73_m2} (ref >=60)
GFR, Est Non African American: 58 mL/min/{1.73_m2} — ABNORMAL LOW (ref >=60)
GLOBULIN: 2.5 g/dL (ref 1.9–3.7)
Glucose, Bld: 89 mg/dL (ref 65–99)
POTASSIUM: 4.3 mmol/L (ref 3.5–5.3)
SODIUM: 137 mmol/L (ref 135–146)
TOTAL PROTEIN: 6.6 g/dL (ref 6.1–8.1)
Total Bilirubin: 0.4 mg/dL (ref 0.2–1.2)

## 2017-09-23 LAB — URINALYSIS, ROUTINE W REFLEX MICROSCOPIC
Bilirubin Urine: NEGATIVE
Glucose, UA: NEGATIVE
HGB URINE DIPSTICK: NEGATIVE
Ketones, ur: NEGATIVE
LEUKOCYTES UA: NEGATIVE
NITRITE: NEGATIVE
PROTEIN: NEGATIVE
Specific Gravity, Urine: 1.013 (ref 1.001–1.03)
pH: 6.5 (ref 5.0–8.0)

## 2017-09-23 LAB — CBC
HEMATOCRIT: 40.9 % (ref 35.0–45.0)
HEMOGLOBIN: 14 g/dL (ref 11.7–15.5)
MCH: 29.3 pg (ref 27.0–33.0)
MCHC: 34.2 g/dL (ref 32.0–36.0)
MCV: 85.6 fL (ref 80.0–100.0)
MPV: 10.1 fL (ref 7.5–12.5)
Platelets: 230 10*3/uL (ref 140–400)
RBC: 4.78 10*6/uL (ref 3.80–5.10)
RDW: 13.3 % (ref 11.0–15.0)
WBC: 6.4 10*3/uL (ref 3.8–10.8)

## 2017-09-23 LAB — TSH: TSH: 0.84 m[IU]/L (ref 0.40–4.50)

## 2017-09-23 LAB — LIPID PANEL
CHOL/HDL RATIO: 3.4 (calc) (ref <5.0)
Cholesterol: 158 mg/dL (ref <200)
HDL: 46 mg/dL — ABNORMAL LOW (ref >50)
LDL Cholesterol (Calc): 88 mg/dL (calc)
NON-HDL CHOLESTEROL (CALC): 112 mg/dL (ref <130)
Triglycerides: 143 mg/dL (ref <150)

## 2017-09-27 ENCOUNTER — Encounter: Payer: Self-pay | Admitting: Family Medicine

## 2017-10-01 ENCOUNTER — Ambulatory Visit (INDEPENDENT_AMBULATORY_CARE_PROVIDER_SITE_OTHER): Payer: Medicare Other | Admitting: Family Medicine

## 2017-10-01 ENCOUNTER — Encounter: Payer: Self-pay | Admitting: Family Medicine

## 2017-10-01 VITALS — BP 112/78 | HR 63 | Resp 16 | Ht 64.0 in | Wt 191.0 lb

## 2017-10-01 DIAGNOSIS — Z23 Encounter for immunization: Secondary | ICD-10-CM | POA: Diagnosis not present

## 2017-10-01 DIAGNOSIS — K5909 Other constipation: Secondary | ICD-10-CM

## 2017-10-01 DIAGNOSIS — I10 Essential (primary) hypertension: Secondary | ICD-10-CM | POA: Diagnosis not present

## 2017-10-01 DIAGNOSIS — E039 Hypothyroidism, unspecified: Secondary | ICD-10-CM

## 2017-10-01 DIAGNOSIS — E785 Hyperlipidemia, unspecified: Secondary | ICD-10-CM

## 2017-10-01 NOTE — Progress Notes (Signed)
Chief Complaint  Patient presents with  . Hypertension    follow up visit since meds were changed in Dec. Has been having episodes of weakness and exertional fatigue  . Hyperlipidemia   Ms. [name] is back for follow-up. Since I saw her last her significant other passed away in the home.  She had cared for him 24 hours a day with end-stage liver disease, and he passed peacefully at home.  She is tearful as she discusses this.  She states that her family has been very attentive and keeping her busy.  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  She has been eating better and exercising.  She is lost couple pounds.  She states she is going to spend time now improving her health and social connections. When I saw her at her last visit she had hyperlipidemia.  I started pravastatin.  She is tolerating it well.  She is here to discuss her new lipid panel.  I showed her the improvement. Results for IVAH, GIRARDOT (MRN [id number]) as of 10/01/2017 15:02  Ref. Range 12/10/2016 10:11 12/28/2016 09:14 06/24/2017 09:40 09/22/2017 11:15  Total CHOL/HDL Ratio Latest Ref Range: <5.0 (calc)  6.3 (H) 5.9 (H) 3.4  Cholesterol Latest Ref Range: <200 mg/dL  214 (H) 254 (H) 158  HDL Cholesterol Latest Ref Range: >50 mg/dL  34 (L) 43 (L) 46 (L)  LDL (calc) Latest Ref Range: <100 mg/dL  122 (H)    LDL Cholesterol (Calc) Latest Units: mg/dL (calc)   164 (H) 88  Non-HDL Cholesterol (Calc) Latest Ref Range: <130 mg/dL (calc)   211 (H) 112  Triglycerides Latest Ref Range: <150 mg/dL  292 (H) 304 (H) 143  She understands that she needs to stay on this statin medication indefinitely. Her blood pressure is well controlled.  At her last visit I added hydrochlorthiazide to her lisinopril.  She even has had some low blood pressures.  She has not had any symptoms of hypotension.  She knows to keep taking her medication daily.  She is held at a couple times because her blood pressure was under  250 systolic.  As long as she is taking her blood pressure monitoring her symptoms I believe this is okay. She was hypothyroid.  Her TSH was over 7.  I increased her Synthroid.  Her new TSH is 0.8.  She will stay on this thyroid dose. She previously was advised to take MiraLAX for constipation.  She states as long as she took the MiraLAX she kept a rash around her rectum.  She thinks that the MiraLAX was causing this.  She stopped that medication and now she takes magnesium every couple of days.  I advised her that this is not good management of her bowels.  It will worsen her constipation condition.  If she was allergic to MiraLAX she should switch to another fiber laxative such as Benefiber or Metamucil.  These are natural products unlikely to cause any reaction.   Patient Active Problem List   Diagnosis Date Noted  . DJD (degenerative joint disease), cervical 10/15/2016  . Vitamin D deficiency 09/28/2016  . Class 1 obesity due to excess calories without serious comorbidity with body mass index (BMI) of 32.0 to 32.9 in adult 09/28/2016  . Knee joint replacement status, bilateral 08/31/2016  . Chronic constipation 08/31/2016  . CTS (carpal tunnel syndrome) 08/31/2016  . Hyperlipidemia 04/19/2015  . Peripheral arterial disease ([place])  04/19/2015  . DJD (degenerative joint disease) of knee 04/04/2014  . Hypertension   . Hypothyroidism   . Recurrent urinary tract infection   . Rectal bleeding 10/31/2013  . FH: colon cancer 10/31/2013    Outpatient Encounter Medications as of 10/01/2017  Medication Sig  . aspirin EC 81 MG tablet Take 81 mg by mouth daily.  . cholecalciferol (VITAMIN D) 1000 units tablet Take 2,000 Units by mouth daily.  . Cyanocobalamin (VITAMIN B 12 PO) Take 1 tablet by mouth daily.   Marland Kitchen docusate sodium (COLACE) 100 MG capsule Take 100 mg by mouth daily.   Marland Kitchen levothyroxine (SYNTHROID, LEVOTHROID) 100 MCG tablet Take 1 tablet (100 mcg total) by mouth daily.  Marland Kitchen  lisinopril-hydrochlorothiazide (PRINZIDE,ZESTORETIC) 10-12.5 MG tablet Take 1 tablet by mouth daily.  Marland Kitchen nystatin (MYCOSTATIN/NYSTOP) powder Apply topically 2 (two) times daily.  Marland Kitchen nystatin cream (MYCOSTATIN) Apply 1 application topically 2 (two) times daily.  . pravastatin (PRAVACHOL) 20 MG tablet Take 1 tablet (20 mg total) by mouth daily.   No facility-administered encounter medications on file as of 10/01/2017.     Allergies  Allergen Reactions  . Codeine Nausea And Vomiting  . Crestor [Rosuvastatin] Other (See Comments)    Body aches  . Flexall [Menthol (Topical Analgesic)] Rash  . Penicillins Rash    Review of Systems  Constitutional: Negative for activity change, appetite change and unexpected weight change.  HENT: Negative for congestion, dental problem, postnasal drip and rhinorrhea.   Eyes: Negative for redness and visual disturbance.  Respiratory: Negative for cough and shortness of breath.   Cardiovascular: Negative for chest pain, palpitations and leg swelling.  Gastrointestinal: Positive for abdominal distention and constipation. Negative for abdominal pain and diarrhea.       Intermittent  Genitourinary: Negative for difficulty urinating and frequency.  Musculoskeletal: Negative for arthralgias and back pain.  Skin: Negative for rash.  Neurological: Negative for dizziness and headaches.  Psychiatric/Behavioral: Positive for dysphoric mood. Negative for sleep disturbance. The patient is not nervous/anxious.     BP 112/78   Pulse 63   Resp 16   Ht 5\' 4"  (1.626 m)   Wt 191 lb (86.6 kg)   LMP 07/20/1990 (Approximate)   SpO2 96%   BMI 32.79 kg/m   Physical Exam  Constitutional: She is oriented to person, place, and time. She appears well-developed and well-nourished.  Overweight.  talkative  HENT:  Head: Normocephalic and atraumatic.  Mouth/Throat: Oropharynx is clear and moist.  Cerumen bilat  Eyes: Conjunctivae are normal. Pupils are equal, round, and  reactive to light.  Neck: Normal range of motion.  Cardiovascular: Normal rate, regular rhythm and normal heart sounds.  Pulmonary/Chest: Effort normal and breath sounds normal.  Musculoskeletal: Normal range of motion. She exhibits no edema.  Both knees with ant arthroscopy scars  Lymphadenopathy:    She has no cervical adenopathy.  Neurological: She is alert and oriented to person, place, and time.  Skin: Skin is warm and dry.  Psychiatric: She has a normal mood and affect. Her behavior is normal.  Appropriate grief    ASSESSMENT/PLAN:  1. Essential hypertension Very well controlled.  Discussed I do not believe she should be holding medication.  2. Chronic constipation Discussed.  Fiber, fluids, walking, activity are all recommended.  Avoid regular use of magnesium or laxatives.  3. Hypothyroidism, unspecified type Corrected  4. Hyperlipidemia, unspecified hyperlipidemia type Much improved.  Tolerating statin.   Patient Instructions  Try not to take the magnesium Take metamucil  daily or a natural stool softener Drink plenty of water  Your blood pressure is low you may hold the medicine You can also cut it in half  Walk every day that you are able  Need follow up in 3 months You will see Dr Mannie Stabile next time  Due for wellness check in April    Raylene Everts, MD

## 2017-10-01 NOTE — Patient Instructions (Addendum)
Try not to take the magnesium Take metamucil daily or a natural stool softener Drink plenty of water  Your blood pressure is low you may hold the medicine You can also cut it in half  Walk every day that you are able  Need follow up in 3 months You will see Dr Mannie Stabile next time  Due for wellness check in April

## 2017-11-29 ENCOUNTER — Ambulatory Visit (INDEPENDENT_AMBULATORY_CARE_PROVIDER_SITE_OTHER): Payer: Medicare Other

## 2017-11-29 VITALS — BP 110/70 | HR 66 | Resp 16 | Ht 64.0 in | Wt 189.0 lb

## 2017-11-29 DIAGNOSIS — Z0000 Encounter for general adult medical examination without abnormal findings: Secondary | ICD-10-CM | POA: Diagnosis not present

## 2017-11-29 NOTE — Progress Notes (Addendum)
Subjective:   [name] is a 77 y.o. female who presents for Medicare Annual (Subsequent) preventive examination.  Review of Systems:         Objective:     Vitals: BP 110/70   Pulse 66   Resp 16   Ht 5\' 4"  (1.626 m)   Wt 189 lb (85.7 kg)   LMP 07/20/1990 (Approximate)   SpO2 95%   BMI 32.44 kg/m   Body mass index is 32.44 kg/m.  Advanced Directives 11/29/2017 11/09/2016 12/23/2015 12/17/2015 04/18/2015 04/05/2014 03/23/2014  Does Patient Have a Medical Advance Directive? No Yes No Yes Yes Yes Yes  Type of Advance Directive - Information systems manager of [employer] Power of [place];Living will Healthcare Power of [place]  Does patient want to make changes to medical advance directive? - No - Patient declined - - No - Patient declined No - Patient declined -  Copy of [place] in Chart? - No - copy requested - Yes - No - copy requested Yes  Would patient like information on creating a medical advance directive? Yes (ED - Information included in AVS) - - - - - -    Tobacco Social History   Tobacco Use  Smoking Status Never Smoker  Smokeless Tobacco Never Used     Counseling given: Not Answered   Clinical Intake:     Pain : No/denies pain Pain Score: 0-No pain     Nutritional Status: BMI > 30  Obese Diabetes: No  How often do you need to have someone help you when you read instructions, pamphlets, or other written materials from your doctor or pharmacy?: 1 - Never What is the last grade level you completed in school?: 12th and some college   Interpreter Needed?: No  Information entered by :: Britanie Harshman LPN   Past Medical History:  Diagnosis Date  . Allergy   . Anemia    prior to hysterectomy  . Back pain    comepensating for knee pain per pt  . Cataract    right eye and immature  . Complication of anesthesia   . Constipation    takes Colace daily and Milk of Mag  . CTS  (carpal tunnel syndrome) 08/31/2016  . Dizziness    but goes away real quick  . Dry eyes    uses Eye Drops daily as needed  . GERD (gastroesophageal reflux disease)   . History of colon polyps   . Hyperlipidemia    was on meds but stopped taking back in June 2015  . Hypertension    takes Lisinopril daily  . Hypothyroidism    takes Synthroid daily  . Joint pain   . Peripheral arterial disease ([place])   . Pneumonia    as a baby  . PONV (postoperative nausea and vomiting)   . Primary localized osteoarthritis of left knee 04/17/2015  . Primary localized osteoarthritis of right knee    knees  . Urinary frequency   . Urinary urgency    Past Surgical History:  Procedure Laterality Date  . ABDOMINAL HYSTERECTOMY  90's   complete  . APPENDECTOMY  80's  . BACK SURGERY    . CATARACT EXTRACTION W/PHACO Right 12/23/2015   Procedure: CATARACT EXTRACTION PHACO AND INTRAOCULAR LENS PLACEMENT RIGHT EYE CDE=6.42;  Surgeon: Williams Che, MD;  Location: [facility];  Service: Ophthalmology;  Laterality: Right;  . COLONOSCOPY  09/09/2004   YKD:XIPJAS rectum and  colon  . COLONOSCOPY N/A 11/08/2013   Procedure: COLONOSCOPY;  Surgeon: Daneil Dolin, MD;  Location: [facility];  Service: Endoscopy;  Laterality: N/A;  10:45  . JOINT REPLACEMENT     2015, 2016  . NECK SURGERY  1983  . RECTOCELE REPAIR  10/2007  . SPINE SURGERY     ruptured disc  . TOTAL KNEE ARTHROPLASTY Right 04/04/2014   DR Noemi Chapel  . TOTAL KNEE ARTHROPLASTY Right 04/04/2014   Procedure: RIGHT TOTAL KNEE ARTHROPLASTY;  Surgeon: Lorn Junes, MD;  Location: [facility];  Service: Orthopedics;  Laterality: Right;  . TOTAL KNEE ARTHROPLASTY Left 04/29/2015   Procedure: TOTAL KNEE ARTHROPLASTY;  Surgeon: Elsie Saas, MD;  Location: [facility];  Service: Orthopedics;  Laterality: Left;  . TUBAL LIGATION     Family History  Problem Relation Age of Onset  . Colon cancer Sister        age 83s  . Myasthenia gravis Sister   . Bladder Cancer  Brother   . Lung cancer Brother        age 36  . Alzheimer's disease Mother   . COPD Father   . Asthma Father   . Alcohol abuse Brother        likely died of pneumonia   Social History   Socioeconomic History  . Marital status: Widowed    Spouse name: Not on file  . Number of children: 2  . Years of education: 62  . Highest education level: Not on file  Occupational History  . Occupation: retired    Comment: Doctor, general practice  Social Needs  . Financial resource strain: Not hard at all  . Food insecurity:    Worry: Never true    Inability: Never true  . Transportation needs:    Medical: No    Non-medical: No  Tobacco Use  . Smoking status: Never Smoker  . Smokeless tobacco: Never Used  Substance and Sexual Activity  . Alcohol use: No  . Drug use: No  . Sexual activity: Never    Birth control/protection: Surgical  Lifestyle  . Physical activity:    Days per week: 5 days    Minutes per session: 10 min  . Stress: Not at all  Relationships  . Social connections:    Talks on phone: Once a week    Gets together: Once a week    Attends religious service: More than 4 times per year    Active member of club or organization: Not on file    Attends meetings of clubs or organizations: 1 to 4 times per year    Relationship status: Widowed  Other Topics Concern  . Not on file  Social History Narrative   widowed      Lives alone   Sig other passed away Sep 26, 2017      Outpatient Encounter Medications as of 11/29/2017  Medication Sig  . aspirin EC 81 MG tablet Take 81 mg by mouth daily.  . cholecalciferol (VITAMIN D) 1000 units tablet Take 2,000 Units by mouth daily.  . Cyanocobalamin (VITAMIN B 12 PO) Take 1 tablet by mouth daily.   Marland Kitchen docusate sodium (COLACE) 100 MG capsule Take 100 mg by mouth daily.   Marland Kitchen levothyroxine (SYNTHROID, LEVOTHROID) 100 MCG tablet Take 1 tablet (100 mcg total) by mouth daily.  Marland Kitchen lisinopril-hydrochlorothiazide (PRINZIDE,ZESTORETIC) 10-12.5 MG  tablet Take 1 tablet by mouth daily.  Marland Kitchen nystatin (MYCOSTATIN/NYSTOP) powder Apply topically 2 (two) times daily.  Marland Kitchen nystatin cream (  MYCOSTATIN) Apply 1 application topically 2 (two) times daily.  . pravastatin (PRAVACHOL) 20 MG tablet Take 1 tablet (20 mg total) by mouth daily.   No facility-administered encounter medications on file as of 11/29/2017.     Activities of Daily Living No flowsheet data found.  Patient Care Team: Caren Macadam, MD as PCP - General (Family Medicine) Gala Romney Cristopher Estimable, MD as Consulting Physician (Gastroenterology)    Assessment:   This is a routine wellness examination for [place].  Exercise Activities and Dietary recommendations    Goals    . Blood Pressure < 140/90    . Exercise 3x per week (30 min per time)     Obese, recommend starting a routine exercise program at least 3 days a week for 30-45 minutes at a time as tolerated.      . Weight (lb) < 160 lb (72.6 kg)       Fall Risk Fall Risk  11/29/2017 10/01/2017 11/09/2016 08/31/2016  Falls in the past year? No No No No   Is the patient's home free of loose throw rugs in walkways, pet beds, electrical cords, etc?   yes      Grab bars in the bathroom? yes      Handrails on the stairs?   yes      Adequate lighting?   yes  Timed Get Up and Go performed:   Depression Screen PHQ 2/9 Scores 11/29/2017 11/09/2016 08/31/2016  PHQ - 2 Score 6 0 0  PHQ- 9 Score 10 - -     Cognitive Function MMSE - Mini Mental State Exam 11/09/2016  Orientation to time 5  Orientation to Place 5  Registration 3  Attention/ Calculation 5  Recall 3  Language- name 2 objects 2  Language- repeat 1  Language- follow 3 step command 3  Language- read & follow direction 1  Write a sentence 1  Copy design 1  Total score 30     6CIT Screen 11/29/2017  What Year? 0 points  What month? 0 points  What time? 0 points  Count back from 20 0 points  Months in reverse 0 points  Repeat phrase 0 points  Total Score 0     Immunization History  Administered Date(s) Administered  . Influenza, High Dose Seasonal PF 04/20/2016  . Influenza,inj,Quad PF,6+ Mos 07/02/2017  . Pneumococcal Conjugate-13 09/28/2016  . Pneumococcal Polysaccharide-23 10/01/2017    Qualifies for Shingles Vaccine? yes  Screening Tests Health Maintenance  Topic Date Due  . TETANUS/TDAP  05/01/1962  . INFLUENZA VACCINE  02/17/2018  . MAMMOGRAM  12/11/2018  . COLONOSCOPY  11/09/2023  . DEXA SCAN  Completed  . PNA vac Low Risk Adult  Completed    Cancer Screenings: Lung: Low Dose CT Chest recommended if Age 65-80 years, 30 pack-year currently smoking OR have quit w/in 15years. Patient does not qualify. Breast:  Up to date on Mammogram? Yes   Up to date of Bone Density/Dexa? Yes Colorectal: 11/09/2023  Additional Screenings: Hepatitis C Screening:      Plan:     I have personally reviewed and noted the following in the patient's chart:   . Medical and social history . Use of alcohol, tobacco or illicit drugs  . Current medications and supplements . Functional ability and status . Nutritional status . Physical activity . Advanced directives . List of other physicians . Hospitalizations, surgeries, and ER visits in previous 12 months . Vitals . Screenings to include cognitive, depression, and falls . Referrals  and appointments  In addition, I have reviewed and discussed with patient certain preventive protocols, quality metrics, and best practice recommendations. A written personalized care plan for preventive services as well as general preventive health recommendations were provided to patient.     Kate Sable, LPN, LPN  5/32/9924

## 2017-11-29 NOTE — Patient Instructions (Signed)
[name] , Thank you for taking time to come for your Medicare Wellness Visit. I appreciate your ongoing commitment to your health goals. Please review the following plan we discussed and let me know if I can assist you in the future.   Screening recommendations/referrals: Colonoscopy: Complete Mammogram: Complete Bone Density: Complete Recommended yearly ophthalmology/optometry visit for glaucoma screening and checkup Recommended yearly dental visit for hygiene and checkup  Vaccinations: Influenza vaccine: complete Pneumococcal vaccine: complete Tdap vaccine: Due  Shingles vaccine: Ask your provider    Advanced directives: Info given   Conditions/risks identified: yes   Next appointment: Listed on discharge paper   Preventive Care 75 Years and Older, Female Preventive care refers to lifestyle choices and visits with your health care provider that can promote health and wellness. What does preventive care include?  A yearly physical exam. This is also called an annual well check.  Dental exams once or twice a year.  Routine eye exams. Ask your health care provider how often you should have your eyes checked.  Personal lifestyle choices, including:  Daily care of your teeth and gums.  Regular physical activity.  Eating a healthy diet.  Avoiding tobacco and drug use.  Limiting alcohol use.  Practicing safe sex.  Taking low-dose aspirin every day.  Taking vitamin and mineral supplements as recommended by your health care provider. What happens during an annual well check? The services and screenings done by your health care provider during your annual well check will depend on your age, overall health, lifestyle risk factors, and family history of disease. Counseling  Your health care provider may ask you questions about your:  Alcohol use.  Tobacco use.  Drug use.  Emotional well-being.  Home and relationship well-being.  Sexual activity.  Eating  habits.  History of falls.  Memory and ability to understand (cognition).  Work and work Statistician.  Reproductive health. Screening  You may have the following tests or measurements:  Height, weight, and BMI.  Blood pressure.  Lipid and cholesterol levels. These may be checked every 5 years, or more frequently if you are over 75 years old.  Skin check.  Lung cancer screening. You may have this screening every year starting at age 42 if you have a 30-pack-year history of smoking and currently smoke or have quit within the past 15 years.  Fecal occult blood test (FOBT) of the stool. You may have this test every year starting at age 72.  Flexible sigmoidoscopy or colonoscopy. You may have a sigmoidoscopy every 5 years or a colonoscopy every 10 years starting at age 76.  Hepatitis C blood test.  Hepatitis B blood test.  Sexually transmitted disease (STD) testing.  Diabetes screening. This is done by checking your blood sugar (glucose) after you have not eaten for a while (fasting). You may have this done every 1-3 years.  Bone density scan. This is done to screen for osteoporosis. You may have this done starting at age 44.  Mammogram. This may be done every 1-2 years. Talk to your health care provider about how often you should have regular mammograms. Talk with your health care provider about your test results, treatment options, and if necessary, the need for more tests. Vaccines  Your health care provider may recommend certain vaccines, such as:  Influenza vaccine. This is recommended every year.  Tetanus, diphtheria, and acellular pertussis (Tdap, Td) vaccine. You may need a Td booster every 10 years.  Zoster vaccine. You may need this after  age 44.  Pneumococcal 13-valent conjugate (PCV13) vaccine. One dose is recommended after age 55.  Pneumococcal polysaccharide (PPSV23) vaccine. One dose is recommended after age 82. Talk to your health care provider about which  screenings and vaccines you need and how often you need them. This information is not intended to replace advice given to you by your health care provider. Make sure you discuss any questions you have with your health care provider. Document Released: 08/02/2015 Document Revised: 03/25/2016 Document Reviewed: 05/07/2015 Elsevier Interactive Patient Education  2017 [place] Prevention in the Home Falls can cause injuries. They can happen to people of all ages. There are many things you can do to make your home safe and to help prevent falls. What can I do on the outside of my home?  Regularly fix the edges of walkways and driveways and fix any cracks.  Remove anything that might make you trip as you walk through a door, such as a raised step or threshold.  Trim any bushes or trees on the path to your home.  Use bright outdoor lighting.  Clear any walking paths of anything that might make someone trip, such as rocks or tools.  Regularly check to see if handrails are loose or broken. Make sure that both sides of any steps have handrails.  Any raised decks and porches should have guardrails on the edges.  Have any leaves, snow, or ice cleared regularly.  Use sand or salt on walking paths during winter.  Clean up any spills in your garage right away. This includes oil or grease spills. What can I do in the bathroom?  Use night lights.  Install grab bars by the toilet and in the tub and shower. Do not use towel bars as grab bars.  Use non-skid mats or decals in the tub or shower.  If you need to sit down in the shower, use a plastic, non-slip stool.  Keep the floor dry. Clean up any water that spills on the floor as soon as it happens.  Remove soap buildup in the tub or shower regularly.  Attach bath mats securely with double-sided non-slip rug tape.  Do not have throw rugs and other things on the floor that can make you trip. What can I do in the bedroom?  Use  night lights.  Make sure that you have a light by your bed that is easy to reach.  Do not use any sheets or blankets that are too big for your bed. They should not hang down onto the floor.  Have a firm chair that has side arms. You can use this for support while you get dressed.  Do not have throw rugs and other things on the floor that can make you trip. What can I do in the kitchen?  Clean up any spills right away.  Avoid walking on wet floors.  Keep items that you use a lot in easy-to-reach places.  If you need to reach something above you, use a strong step stool that has a grab bar.  Keep electrical cords out of the way.  Do not use floor polish or wax that makes floors slippery. If you must use wax, use non-skid floor wax.  Do not have throw rugs and other things on the floor that can make you trip. What can I do with my stairs?  Do not leave any items on the stairs.  Make sure that there are handrails on both sides of the stairs  and use them. Fix handrails that are broken or loose. Make sure that handrails are as long as the stairways.  Check any carpeting to make sure that it is firmly attached to the stairs. Fix any carpet that is loose or worn.  Avoid having throw rugs at the top or bottom of the stairs. If you do have throw rugs, attach them to the floor with carpet tape.  Make sure that you have a light switch at the top of the stairs and the bottom of the stairs. If you do not have them, ask someone to add them for you. What else can I do to help prevent falls?  Wear shoes that:  Do not have high heels.  Have rubber bottoms.  Are comfortable and fit you well.  Are closed at the toe. Do not wear sandals.  If you use a stepladder:  Make sure that it is fully opened. Do not climb a closed stepladder.  Make sure that both sides of the stepladder are locked into place.  Ask someone to hold it for you, if possible.  Clearly mark and make sure that you  can see:  Any grab bars or handrails.  First and last steps.  Where the edge of each step is.  Use tools that help you move around (mobility aids) if they are needed. These include:  Canes.  Walkers.  Scooters.  Crutches.  Turn on the lights when you go into a dark area. Replace any light bulbs as soon as they burn out.  Set up your furniture so you have a clear path. Avoid moving your furniture around.  If any of your floors are uneven, fix them.  If there are any pets around you, be aware of where they are.  Review your medicines with your doctor. Some medicines can make you feel dizzy. This can increase your chance of falling. Ask your doctor what other things that you can do to help prevent falls. This information is not intended to replace advice given to you by your health care provider. Make sure you discuss any questions you have with your health care provider. Document Released: 05/02/2009 Document Revised: 12/12/2015 Document Reviewed: 08/10/2014 Elsevier Interactive Patient Education  2017 [employer].

## 2017-12-06 ENCOUNTER — Ambulatory Visit: Payer: Medicare Other

## 2017-12-23 ENCOUNTER — Encounter: Payer: Self-pay | Admitting: Family Medicine

## 2017-12-24 ENCOUNTER — Encounter: Payer: Self-pay | Admitting: Family Medicine

## 2017-12-29 ENCOUNTER — Ambulatory Visit: Payer: Medicare Other | Admitting: Family Medicine

## 2017-12-30 ENCOUNTER — Ambulatory Visit: Payer: Medicare Other | Admitting: Cardiovascular Disease

## 2017-12-30 ENCOUNTER — Encounter: Payer: Self-pay | Admitting: Family Medicine

## 2017-12-30 ENCOUNTER — Other Ambulatory Visit: Payer: Self-pay

## 2017-12-30 ENCOUNTER — Encounter: Payer: Self-pay | Admitting: Cardiovascular Disease

## 2017-12-30 ENCOUNTER — Encounter: Payer: Self-pay | Admitting: *Deleted

## 2017-12-30 ENCOUNTER — Ambulatory Visit: Payer: Medicare Other | Admitting: Family Medicine

## 2017-12-30 VITALS — BP 120/80 | HR 75 | Temp 97.7°F | Resp 12 | Ht 63.5 in | Wt 185.0 lb

## 2017-12-30 VITALS — BP 122/82 | HR 56 | Ht 63.5 in | Wt 185.4 lb

## 2017-12-30 DIAGNOSIS — E559 Vitamin D deficiency, unspecified: Secondary | ICD-10-CM | POA: Diagnosis not present

## 2017-12-30 DIAGNOSIS — R0609 Other forms of dyspnea: Secondary | ICD-10-CM | POA: Diagnosis not present

## 2017-12-30 DIAGNOSIS — E039 Hypothyroidism, unspecified: Secondary | ICD-10-CM | POA: Diagnosis not present

## 2017-12-30 DIAGNOSIS — R0602 Shortness of breath: Secondary | ICD-10-CM

## 2017-12-30 DIAGNOSIS — R079 Chest pain, unspecified: Secondary | ICD-10-CM

## 2017-12-30 DIAGNOSIS — E785 Hyperlipidemia, unspecified: Secondary | ICD-10-CM

## 2017-12-30 DIAGNOSIS — I10 Essential (primary) hypertension: Secondary | ICD-10-CM

## 2017-12-30 DIAGNOSIS — Z23 Encounter for immunization: Secondary | ICD-10-CM

## 2017-12-30 DIAGNOSIS — K219 Gastro-esophageal reflux disease without esophagitis: Secondary | ICD-10-CM

## 2017-12-30 DIAGNOSIS — E782 Mixed hyperlipidemia: Secondary | ICD-10-CM

## 2017-12-30 LAB — TSH: TSH: 0.23 mIU/L — ABNORMAL LOW (ref 0.40–4.50)

## 2017-12-30 MED ORDER — LISINOPRIL-HYDROCHLOROTHIAZIDE 10-12.5 MG PO TABS: 0.5000 | ORAL_TABLET | Freq: Every day | ORAL | 3 refills | Status: DC

## 2017-12-30 MED ORDER — OMEPRAZOLE 20 MG PO CPDR: 20.0000 mg | DELAYED_RELEASE_CAPSULE | Freq: Every day | ORAL | 2 refills | Status: DC

## 2017-12-30 MED ORDER — PRAVASTATIN SODIUM 20 MG PO TABS: 20.0000 mg | ORAL_TABLET | Freq: Every day | ORAL | 3 refills | Status: DC

## 2017-12-30 NOTE — Patient Instructions (Addendum)
Calcium for colon health -it has magneisium in the pills.  You need calcium 600 mg twice a day.   [place]ips for colon health

## 2017-12-30 NOTE — Progress Notes (Signed)
Patient ID: [name], female    DOB: Oct 30, 1940, 77 y.o.   MRN: [id number]  Chief Complaint  Patient presents with  . Establish Care    former [facility] patient  . Hypertension    Allergies Codeine; Crestor [rosuvastatin]; Flexall [menthol (topical analgesic)]; and Penicillins  Subjective:   [name] is a 77 y.o. female who presents to [facility] today.  HPI [name] is a very nice 77 year old lady who presents today to establish care.  She is previously been seen by Dr. Lysle Morales in this practice.  She reports that she has been followed for high blood pressure, high cholesterol, and hypothyroidism.  3 months ago her dose of thyroid medication was increased from 75 mcg p.o. daily to 100 mcg p.o. daily.  She reports that she has been compliant with the medication.  She denies any palpitations or diarrhea.    She reports that she has been taking her cholesterol medication as directed.  Cholesterol was last checked 3 months ago which revealed her LDL was at goal.  She denies any myalgias or side effects with the medication.  Her liver test was also checked at that time which was normal.  Patient initially tells me that she has had some chest pain that is been going on for the past 2 weeks.  She reports that it is happened twice where she has had an episode of severe substernal chest pain which is pressure and heaviness in quality.  Reports that the pain and pressure radiated up into her neck.  Denies any shortness of breath, palpitations, sweating, or nausea with the episodes.  Reports that the episode lasted 5 to 10 minutes.  Reports that she takes Alka-Seltzer and it does go away.  Reports that it can come on right after eating or can come on later several hours after eating.  After much discussion, she then tells me that this has been going on and off for several years.  She tells me that she has had an EKG done on her heart and told that it was not cardiovascular in  nature that her heart was okay.  She has never had a stress test.  She does report that when she takes Alka-Seltzer but her symptoms are better.  She does not complain of any reflux.  She denies any epigastric pain at other times.  She reports that she has had some reflux in the past but it does not seem to be bothering her at this time.  She denies any difficulty swallowing.  She reports she has always experienced some constipation with her bowel movements.  Occasionally uses some magnesium to help with them.  Denies any melena or bright red blood per rectum. Reports that she has noticed that when moves around the house gets winded.  Reports this is been gradual over the past couple years but does associate this with some weight gain.  Reports that she has gained a good bit of weight over the past couple years.  Reports that her mood is good.  She does not feel down or depressed.  Is still grieving over the loss of her significant other.  Denies any suicidal or homicidal ideation.  Reports she is not having any chest pain today in the office.   Past Medical History:  Diagnosis Date  . Allergy   . Anemia    prior to hysterectomy  . Back pain    comepensating for knee pain per pt  .  Cataract    right eye and immature  . Complication of anesthesia   . Constipation    takes Colace daily and Milk of Mag  . CTS (carpal tunnel syndrome) 08/31/2016  . Dizziness    but goes away real quick  . Dry eyes    uses Eye Drops daily as needed  . GERD (gastroesophageal reflux disease)   . History of colon polyps   . Hyperlipidemia    was on meds but stopped taking back in June 2015  . Hypertension    takes Lisinopril daily  . Hypothyroidism    takes Synthroid daily  . Joint pain   . Peripheral arterial disease ([place])   . Pneumonia    as a baby  . PONV (postoperative nausea and vomiting)   . Primary localized osteoarthritis of left knee 04/17/2015  . Primary localized osteoarthritis of right knee      knees  . Urinary frequency   . Urinary urgency     Past Surgical History:  Procedure Laterality Date  . ABDOMINAL HYSTERECTOMY  90's   complete  . APPENDECTOMY  80's  . BACK SURGERY    . CATARACT EXTRACTION W/PHACO Right 12/23/2015   Procedure: CATARACT EXTRACTION PHACO AND INTRAOCULAR LENS PLACEMENT RIGHT EYE CDE=6.42;  Surgeon: Williams Che, MD;  Location: [facility];  Service: Ophthalmology;  Laterality: Right;  . COLONOSCOPY  09/09/2004   ZNB:VAPOLI rectum and colon  . COLONOSCOPY N/A 11/08/2013   Procedure: COLONOSCOPY;  Surgeon: Daneil Dolin, MD;  Location: [facility];  Service: Endoscopy;  Laterality: N/A;  10:45  . JOINT REPLACEMENT     2015, 2016  . NECK SURGERY  1983  . RECTOCELE REPAIR  10/2007  . SPINE SURGERY     ruptured disc  . TOTAL KNEE ARTHROPLASTY Right 04/04/2014   DR Noemi Chapel  . TOTAL KNEE ARTHROPLASTY Right 04/04/2014   Procedure: RIGHT TOTAL KNEE ARTHROPLASTY;  Surgeon: Lorn Junes, MD;  Location: [facility];  Service: Orthopedics;  Laterality: Right;  . TOTAL KNEE ARTHROPLASTY Left 04/29/2015   Procedure: TOTAL KNEE ARTHROPLASTY;  Surgeon: Elsie Saas, MD;  Location: [facility];  Service: Orthopedics;  Laterality: Left;  . TUBAL LIGATION      Family History  Problem Relation Age of Onset  . Colon cancer Sister        age 10s  . Myasthenia gravis Sister   . Bladder Cancer Brother   . Lung cancer Brother        age 38  . Alzheimer's disease Mother   . COPD Father   . Asthma Father   . Alcohol abuse Brother        likely died of pneumonia     Social History   Socioeconomic History  . Marital status: Widowed    Spouse name: Not on file  . Number of children: 2  . Years of education: 58  . Highest education level: Not on file  Occupational History  . Occupation: retired    Comment: Doctor, general practice  Social Needs  . Financial resource strain: Not hard at all  . Food insecurity:    Worry: Never true    Inability: Never true  .  Transportation needs:    Medical: No    Non-medical: No  Tobacco Use  . Smoking status: Never Smoker  . Smokeless tobacco: Never Used  Substance and Sexual Activity  . Alcohol use: No  . Drug use: No  . Sexual activity: Never  Birth control/protection: Surgical  Lifestyle  . Physical activity:    Days per week: 5 days    Minutes per session: 10 min  . Stress: Not at all  Relationships  . Social connections:    Talks on phone: Once a week    Gets together: Once a week    Attends religious service: More than 4 times per year    Active member of club or organization: Not on file    Attends meetings of clubs or organizations: 1 to 4 times per year    Relationship status: Widowed  Other Topics Concern  . Not on file  Social History Narrative   Husband died after over 61 years and then a year after husband died, she met a man who she had a relationship with for over ten years. He died in 2017/09/16 after long illness.   Live alone.    Has two children, grown sons.    Gets out with neices, enjoys going out to eat, going to [employer].   Goes to church.    Eats all food groups, eats some meat.       Current Outpatient Medications on File Prior to Visit  Medication Sig Dispense Refill  . aspirin EC 81 MG tablet Take 81 mg by mouth daily.    . cholecalciferol (VITAMIN D) 1000 units tablet Take 2,000 Units by mouth daily.    . Cyanocobalamin (VITAMIN B 12 PO) Take 1 tablet by mouth daily.     Marland Kitchen docusate sodium (COLACE) 100 MG capsule Take 100 mg by mouth daily.     Marland Kitchen levothyroxine (SYNTHROID, LEVOTHROID) 100 MCG tablet Take 1 tablet (100 mcg total) by mouth daily. 90 tablet 3  . nystatin (MYCOSTATIN/NYSTOP) powder Apply topically 2 (two) times daily. 30 g 3  . nystatin cream (MYCOSTATIN) Apply 1 application topically 2 (two) times daily. 30 g 1   No current facility-administered medications on file prior to visit.     Review of Systems  Constitutional: Negative for activity  change, appetite change and fever.  Eyes: Negative for visual disturbance.  Respiratory: Negative for cough, chest tightness and shortness of breath.   Cardiovascular: Positive for chest pain. Negative for palpitations and leg swelling.       Does not have any current chest pain but has had some over the past couple weeks.  Gastrointestinal: Negative for abdominal pain, nausea and vomiting.  Genitourinary: Negative for dysuria, frequency and urgency.  Neurological: Negative for dizziness, syncope and light-headedness.  Hematological: Negative for adenopathy.     Objective:   BP 120/80 (BP Location: Left Arm, Patient Position: Sitting, Cuff Size: Large)   Pulse 75   Temp 97.7 F (36.5 C) (Temporal)   Resp 12   Ht 5' 3.5" (1.613 m)   Wt 185 lb (83.9 kg)   LMP 07/20/1990 (Approximate)   SpO2 96%   BMI 32.26 kg/m   Physical Exam  Constitutional: She is oriented to person, place, and time. She appears well-developed and well-nourished. No distress.  HENT:  Head: Normocephalic and atraumatic.  Eyes: Pupils are equal, round, and reactive to light. No scleral icterus.  Neck: Normal range of motion. Neck supple. No JVD present. No thyromegaly present.  Cardiovascular: Normal rate, regular rhythm and normal heart sounds.  No chest wall tenderness to palpation.  Pulmonary/Chest: Effort normal and breath sounds normal. No respiratory distress. She has no wheezes.  Abdominal: Soft. Bowel sounds are normal. She exhibits no distension. There is no tenderness.  There is no guarding.  Neurological: She is alert and oriented to person, place, and time. No cranial nerve deficit.  Skin: Skin is warm and dry. Capillary refill takes less than 2 seconds.  Psychiatric: She has a normal mood and affect. Her behavior is normal. Judgment and thought content normal.  Nursing note and vitals reviewed.    Assessment and Plan  1. Essential hypertension Continue 1/2 tablet p.o. daily as directed. Check  BMP q. 6 months. - lisinopril-hydrochlorothiazide (PRINZIDE,ZESTORETIC) 10-12.5 MG tablet; Take 0.5 tablets by mouth daily.  Dispense: 90 tablet; Refill: 3  2. Hyperlipidemia, unspecified hyperlipidemia type LDL at goal.  Continue medication.  Discussed with patient that she needs liver check every 6 months. Hyperlipidemia and the associated risk of ASCVD were discussed today. Primary vs. Secondary prevention of ASCVD were discussed and how it relates to patient morbidity, mortality, and quality of life. Shared decision making with patient including the risks of statins vs.benefits of ASCVD risk reduction discussed.  Risks of stains discussed including myopathy, rhabdomyoloysis, liver problems, increased risk of diabetes discussed. We discussed heart healthy diet, lifestyle modifications, risk factor modifications, and adherence to the recommended treatment plan. We discussed the need to periodically monitor lipid panel and liver function tests while on statin therapy.   - pravastatin (PRAVACHOL) 20 MG tablet; Take 1 tablet (20 mg total) by mouth daily.  Dispense: 90 tablet; Refill: 3  3. Vitamin D deficiency Continue current medications.  Calcium dosing recommended.  4. Hypothyroidism, unspecified type To the fact the patient's TSH 3 months ago had decreased substantially with the increase in the Synthroid, I do believe that she needs a recheck to make sure she is not overly suppressed.  We will send in refill once TSH is within normal limits.  If her TSH is overly suppressed we will change her dosing from 100 mcg to 88 mcg p.o. daily.  Counseled regarding way to take medication. - TSH  5. Chest pain, unspecified type Chest pain, uncertain etiology but patient has cardiovascular risk factors.  At this time will refer to cardiology for evaluation and for stress test.  EKG was done today and results given to patient.  Continue current medications as directed. - EKG 12-Lead - Ambulatory referral to  Cardiology  6. Need for Tdap vaccination Examination given today. - Tdap vaccine greater than or equal to 7yo IM  Return in about 6 weeks (around 02/10/2018). Caren Macadam, MD 12/30/2017

## 2017-12-30 NOTE — Progress Notes (Signed)
CARDIOLOGY CONSULT NOTE  Patient ID: [name] MRN: [id number] DOB/AGE: January 30, 1941 77 y.o.  Admit date: (Not on file) Primary Physician: Caren Macadam, MD Referring Physician: Caren Macadam, MD  Reason for Consultation: Chest pain  HPI: [name] is a 77 y.o. female who is being seen today for the evaluation of chest pain at the request of Caren Macadam, MD.   Past medical history includes hypertension, hyperlipidemia, peripheral vascular disease, and hypothyroidism.  She has occasional chest pains which primarily occur at rest and are retrosternally located and radiate to the back into the right side of her neck.  She does get relief with Alka-Seltzer.  She denies dysphagia for solids and liquids.  She does mention that she has an uncomfortable sensation in her chest occasionally after using ketchup which she does frequently.  She has put on a lot of weight recently primarily in the abdominal region.  She said she used to be thin growing up.  She restricts her calories to 1200 a day.  She has lost 12 or 13 pounds since March 1.  She has occasional right groin pains which she attributes to constipation.  Symptoms are relieved with changes in position.  She denies claudication pain she has some bilateral feet pain on the dorsal aspects of her feet.  She underwent bilateral total knee replacement surgeries in 2015 and 2016.  She has never smoked and never drank alcohol.  She raked leaves yesterday without any exertional dyspnea.  She primarily gets exertional dyspnea when she is rushing.  She has no recent radiographic imaging.  Noted to have moderate bilateral external iliac artery stenosis with normal ABIs on 04/22/15.  ECG performed today which I personally reviewed showed sinus bradycardia (57 bpm), low voltage, and nonspecific T wave abnormalities in III, aVF, and V2-3.   Allergies  Allergen Reactions  . Codeine Nausea And Vomiting  . Crestor  [Rosuvastatin] Other (See Comments)    Body aches  . Flexall [Menthol (Topical Analgesic)] Rash  . Penicillins Rash    Current Outpatient Medications  Medication Sig Dispense Refill  . aspirin EC 81 MG tablet Take 81 mg by mouth daily.    . cholecalciferol (VITAMIN D) 1000 units tablet Take 2,000 Units by mouth daily.    . Cyanocobalamin (VITAMIN B 12 PO) Take 1 tablet by mouth daily.     Marland Kitchen docusate sodium (COLACE) 100 MG capsule Take 100 mg by mouth daily.     Marland Kitchen levothyroxine (SYNTHROID, LEVOTHROID) 100 MCG tablet Take 1 tablet (100 mcg total) by mouth daily. 90 tablet 3  . lisinopril-hydrochlorothiazide (PRINZIDE,ZESTORETIC) 10-12.5 MG tablet Take 0.5 tablets by mouth daily. 90 tablet 3  . nystatin (MYCOSTATIN/NYSTOP) powder Apply topically 2 (two) times daily. 30 g 3  . nystatin cream (MYCOSTATIN) Apply 1 application topically 2 (two) times daily. 30 g 1  . pravastatin (PRAVACHOL) 20 MG tablet Take 1 tablet (20 mg total) by mouth daily. 90 tablet 3   No current facility-administered medications for this visit.     Past Medical History:  Diagnosis Date  . Allergy   . Anemia    prior to hysterectomy  . Back pain    comepensating for knee pain per pt  . Cataract    right eye and immature  . Complication of anesthesia   . Constipation    takes Colace daily and Milk of Mag  . CTS (carpal tunnel syndrome) 08/31/2016  . Dizziness    but  goes away real quick  . Dry eyes    uses Eye Drops daily as needed  . GERD (gastroesophageal reflux disease)   . History of colon polyps   . Hyperlipidemia    was on meds but stopped taking back in June 2015  . Hypertension    takes Lisinopril daily  . Hypothyroidism    takes Synthroid daily  . Joint pain   . Peripheral arterial disease ([place])   . Pneumonia    as a baby  . PONV (postoperative nausea and vomiting)   . Primary localized osteoarthritis of left knee 04/17/2015  . Primary localized osteoarthritis of right knee    knees  .  Urinary frequency   . Urinary urgency     Past Surgical History:  Procedure Laterality Date  . ABDOMINAL HYSTERECTOMY  90's   complete  . APPENDECTOMY  80's  . BACK SURGERY    . CATARACT EXTRACTION W/PHACO Right 12/23/2015   Procedure: CATARACT EXTRACTION PHACO AND INTRAOCULAR LENS PLACEMENT RIGHT EYE CDE=6.42;  Surgeon: Williams Che, MD;  Location: [facility];  Service: Ophthalmology;  Laterality: Right;  . COLONOSCOPY  09/09/2004   XKG:YJEHUD rectum and colon  . COLONOSCOPY N/A 11/08/2013   Procedure: COLONOSCOPY;  Surgeon: Daneil Dolin, MD;  [facility];  Service: Endoscopy;  Laterality: N/A;  10:45  . JOINT REPLACEMENT     2015, 2016  . NECK SURGERY  1983  . RECTOCELE REPAIR  10/2007  . SPINE SURGERY     ruptured disc  . TOTAL KNEE ARTHROPLASTY Right 04/04/2014   DR Noemi Chapel  . TOTAL KNEE ARTHROPLASTY Right 04/04/2014   Procedure: RIGHT TOTAL KNEE ARTHROPLASTY;  Surgeon: Lorn Junes, MD;  Location: [place];  Service: Orthopedics;  Laterality: Right;  . TOTAL KNEE ARTHROPLASTY Left 04/29/2015   Procedure: TOTAL KNEE ARTHROPLASTY;  Surgeon: Elsie Saas, MD;  Location: [place];  Service: Orthopedics;  Laterality: Left;  . TUBAL LIGATION      Social History   Socioeconomic History  . Marital status: Widowed    Spouse name: Not on file  . Number of children: 2  . Years of education: 68  . Highest education level: Not on file  Occupational History  . Occupation: retired    Comment: Doctor, general practice  Social Needs  . Financial resource strain: Not hard at all  . Food insecurity:    Worry: Never true    Inability: Never true  . Transportation needs:    Medical: No    Non-medical: No  Tobacco Use  . Smoking status: Never Smoker  . Smokeless tobacco: Never Used  Substance and Sexual Activity  . Alcohol use: No  . Drug use: No  . Sexual activity: Never    Birth control/protection: Surgical  Lifestyle  . Physical activity:    Days per week: 5 days     Minutes per session: 10 min  . Stress: Not at all  Relationships  . Social connections:    Talks on phone: Once a week    Gets together: Once a week    Attends religious service: More than 4 times per year    Active member of club or organization: Not on file    Attends meetings of clubs or organizations: 1 to 4 times per year    Relationship status: Widowed  . Intimate partner violence:    Fear of current or ex partner: No    Emotionally abused: No    Physically abused: No  Forced sexual activity: No  Other Topics Concern  . Not on file  Social History Narrative   Husband died after over 30 years and then a year after husband died, she met a man who she had a relationship with for over ten years. He died in 11-Sep-2017 after long illness.   Live alone.    Has two children, grown sons.    Gets out with neices, enjoys going out to eat, going to [employer].   Goes to church.    Eats all food groups, eats some meat.         No family history of premature CAD in 1st degree relatives.  Current Meds  Medication Sig  . aspirin EC 81 MG tablet Take 81 mg by mouth daily.  . cholecalciferol (VITAMIN D) 1000 units tablet Take 2,000 Units by mouth daily.  . Cyanocobalamin (VITAMIN B 12 PO) Take 1 tablet by mouth daily.   Marland Kitchen docusate sodium (COLACE) 100 MG capsule Take 100 mg by mouth daily.   Marland Kitchen levothyroxine (SYNTHROID, LEVOTHROID) 100 MCG tablet Take 1 tablet (100 mcg total) by mouth daily.  Marland Kitchen lisinopril-hydrochlorothiazide (PRINZIDE,ZESTORETIC) 10-12.5 MG tablet Take 0.5 tablets by mouth daily.  Marland Kitchen nystatin (MYCOSTATIN/NYSTOP) powder Apply topically 2 (two) times daily.  Marland Kitchen nystatin cream (MYCOSTATIN) Apply 1 application topically 2 (two) times daily.  . pravastatin (PRAVACHOL) 20 MG tablet Take 1 tablet (20 mg total) by mouth daily.      Review of systems complete and found to be negative unless listed above in HPI    Physical exam Blood pressure 122/82, pulse (!) 56, height 5'  3.5" (1.613 m), weight 185 lb 6.4 oz (84.1 kg), last menstrual period 07/20/1990, SpO2 98 %. General: NAD Neck: No JVD, no thyromegaly or thyroid nodule.  Lungs: Clear to auscultation bilaterally with normal respiratory effort. CV: Nondisplaced PMI. Regular rate and rhythm, normal S1/S2, no S3/S4, no murmur.  Mild tenderness of rib below left breast.  No peripheral edema.  No carotid bruit.   Abdomen: Soft, nontender, no distention.  Skin: Intact without lesions or rashes.  Neurologic: Alert and oriented x 3.  Psych: Normal affect. Extremities: No clubbing or cyanosis.  HEENT: Normal.   ECG: Most recent ECG reviewed.   Labs: Lab Results  Component Value Date/Time   K 4.3 09/22/2017 11:15 AM   BUN 21 09/22/2017 11:15 AM   CREATININE 0.97 (H) 09/22/2017 11:15 AM   ALT 14 09/22/2017 11:15 AM   TSH 0.84 09/22/2017 11:15 AM   HGB 14.0 09/22/2017 11:15 AM     Lipids: Lab Results  Component Value Date/Time   LDLCALC 88 09/22/2017 11:15 AM   CHOL 158 09/22/2017 11:15 AM   TRIG 143 09/22/2017 11:15 AM   HDL 46 (L) 09/22/2017 11:15 AM        ASSESSMENT AND PLAN:  1. Chest pain and exertional dyspnea: Symptoms are somewhat atypical in that chest pain usually occurs with rest and never with exertion.  She also has some symptoms of GERD.  I will start omeprazole 20 mg daily to be taken 30 to 60 minutes before meals for 10 to 12 weeks to see if this helps improve symptoms.  I will also obtain an exercise Myoview stress test to evaluate for ischemic heart disease so as not to overlook an atypical presentation.  2. PVD: Moderate bilateral external iliac artery stenosis with normal ABIs noted in October 2016.  Denies claudication pain.  On ASA and statin.  3. Hypertension: Blood  pressure is normal.  No changes to therapy.  4. Hyperlipidemia: TC 158, HDL 46, TG 143, LDL 88 on 09/22/17. Currently taking pravastatin 20 mg.  No changes to therapy at present.  5. Hypothyroidism: TSH normal at  0.84 on 09/22/17.  6.  GERD: She has typical GERD symptoms.  We talked about avoidance of foods which would provoke this.  I also encouraged continued weight loss as this will reduce intra-abdominal pressure and consequent symptoms. I will start omeprazole 20 mg daily to be taken 30 to 60 minutes before meals for 10 to 12 weeks to see if this helps improve symptoms.   Disposition: Follow up in 10-12 weeks.   Signed: Kate Sable, M.D., F.A.C.C.  12/30/2017, 10:54 AM

## 2017-12-30 NOTE — Addendum Note (Signed)
Addended by: Levonne Hubert on: 12/30/2017 11:30 AM   Modules accepted: Orders

## 2017-12-30 NOTE — Patient Instructions (Signed)
Medication Instructions:  Your physician has recommended you make the following change in your medication:  Start Omeprazole 20 mg Daily for 12 weeks.   Labwork: NONE   Testing/Procedures: Your physician has requested that you have en exercise stress myoview. For further information please visit [email or web address]. Please follow instruction sheet, as given.    Follow-Up: Your physician recommends that you schedule a follow-up appointment with Dr. Bronson Ing.   Any Other Special Instructions Will Be Listed Below (If Applicable).     If you need a refill on your cardiac medications before your next appointment, please call your pharmacy. Thank you for choosing [facility]!

## 2017-12-31 ENCOUNTER — Telehealth: Payer: Self-pay | Admitting: Family Medicine

## 2017-12-31 ENCOUNTER — Encounter: Payer: Self-pay | Admitting: Family Medicine

## 2017-12-31 DIAGNOSIS — E039 Hypothyroidism, unspecified: Secondary | ICD-10-CM

## 2017-12-31 MED ORDER — LEVOTHYROXINE SODIUM 88 MCG PO TABS: 88.0000 ug | ORAL_TABLET | Freq: Every day | ORAL | 1 refills | Status: DC

## 2017-12-31 NOTE — Telephone Encounter (Signed)
Spoke with patient and advised of recent lab results and Dr.Hagler's treatment plan with verbal understanding. She is in agreement with treatment plan. She understands to go pick up new thyroid medication dose at the pharmacy.

## 2017-12-31 NOTE — Telephone Encounter (Signed)
Please call patient and advised that her thyroid is over Lee suppressed.  Her TSH is 0.2.  This means that her thyroid medication dose is too high.  We need to decrease her dosing to Synthroid 88 mcg p.o. daily.  I have sent this into her pharmacy.  She will need to have her thyroid repeated in 3 months.  Please advise patient.

## 2018-01-05 ENCOUNTER — Encounter (HOSPITAL_BASED_OUTPATIENT_CLINIC_OR_DEPARTMENT_OTHER)
Admission: RE | Admit: 2018-01-05 | Discharge: 2018-01-05 | Disposition: A | Discharge status: Home / Self Care | Payer: Medicare Other | Source: Ambulatory Visit | Attending: Cardiovascular Disease | Admitting: Cardiovascular Disease

## 2018-01-05 ENCOUNTER — Encounter (HOSPITAL_COMMUNITY)
Admission: RE | Admit: 2018-01-05 | Discharge: 2018-01-05 | Disposition: A | Discharge status: Home / Self Care | Payer: Medicare Other | Source: Ambulatory Visit | Attending: Cardiovascular Disease | Admitting: Cardiovascular Disease

## 2018-01-05 ENCOUNTER — Encounter (HOSPITAL_COMMUNITY): Payer: Self-pay

## 2018-01-05 DIAGNOSIS — R079 Chest pain, unspecified: Secondary | ICD-10-CM | POA: Diagnosis not present

## 2018-01-05 DIAGNOSIS — R0609 Other forms of dyspnea: Secondary | ICD-10-CM | POA: Diagnosis not present

## 2018-01-05 DIAGNOSIS — R0602 Shortness of breath: Secondary | ICD-10-CM | POA: Diagnosis present

## 2018-01-05 LAB — NM MYOCAR MULTI W/SPECT W/WALL MOTION / EF
CHL CUP MPHR: 146 {beats}/min
CHL RATE OF PERCEIVED EXERTION: 13
CSEPHR: 97 %
Estimated workload: 4.6 METS
Exercise duration (min): 3 min
Exercise duration (sec): 0 s
LHR: 0.34
LV dias vol: 46 mL (ref 46–106)
LV sys vol: 7 mL
NUC STRESS TID: 1.01
Peak HR: 142 {beats}/min
Rest HR: 61 {beats}/min
SDS: 2
SRS: 0
SSS: 2

## 2018-01-05 MED ORDER — TECHNETIUM TC 99M TETROFOSMIN IV KIT
30.0000 | PACK | Freq: Once | INTRAVENOUS | Status: AC | PRN
  Administered 2018-01-05: 32 via INTRAVENOUS

## 2018-01-05 MED ORDER — REGADENOSON 0.4 MG/5ML IV SOLN
INTRAVENOUS | Status: AC
  Filled 2018-01-05: qty 5

## 2018-01-05 MED ORDER — TECHNETIUM TC 99M TETROFOSMIN IV KIT
10.0000 | PACK | Freq: Once | INTRAVENOUS | Status: AC | PRN
  Administered 2018-01-05: 9.7 via INTRAVENOUS

## 2018-01-05 MED ORDER — SODIUM CHLORIDE 0.9% FLUSH
INTRAVENOUS | Status: AC
  Administered 2018-01-05: 10 mL via INTRAVENOUS
  Filled 2018-01-05: qty 10

## 2018-02-10 ENCOUNTER — Other Ambulatory Visit: Payer: Self-pay

## 2018-02-10 ENCOUNTER — Ambulatory Visit: Payer: Medicare Other | Admitting: Family Medicine

## 2018-02-10 ENCOUNTER — Encounter: Payer: Self-pay | Admitting: Family Medicine

## 2018-02-10 VITALS — BP 110/68 | HR 57 | Temp 97.8°F | Resp 16 | Ht 63.5 in | Wt 183.1 lb

## 2018-02-10 DIAGNOSIS — E039 Hypothyroidism, unspecified: Secondary | ICD-10-CM

## 2018-02-10 DIAGNOSIS — Z6832 Body mass index (BMI) 32.0-32.9, adult: Secondary | ICD-10-CM

## 2018-02-10 DIAGNOSIS — E785 Hyperlipidemia, unspecified: Secondary | ICD-10-CM

## 2018-02-10 DIAGNOSIS — I10 Essential (primary) hypertension: Secondary | ICD-10-CM

## 2018-02-10 DIAGNOSIS — E6609 Other obesity due to excess calories: Secondary | ICD-10-CM

## 2018-02-10 NOTE — Progress Notes (Signed)
Patient ID: [name], female    DOB: [birth year], 77 y.o.   MRN: [id number]  Chief Complaint  Patient presents with  . Medication Management  . Constipation    Allergies Codeine; Crestor [rosuvastatin]; Flexall [menthol (topical analgesic)]; and Penicillins  Subjective:   [name] is a 77 y.o. female who presents to [facility] today.  HPI Ms. Lininger presents today for follow-up visit.  She wanted to follow-up for a visit prior to transferring her care to Dr. Morrison Old.  She reports that she has been doing well.  Has decreased her thyroid medication to Synthroid 88 mcg a day.  Is not due to get it rechecked for several months.  Reports that her constipation has improved.  She reports that her bowel movements are not hard and she is not having to strain to have bowel movements.  She reports she is having a bowel movement once a day.  She denies any melena or bright red blood per rectum.  She has had a colonoscopy less than 5 years ago.  She has been using MiraLAX occasionally and using the [facility] for [employer] on a daily basis.  She is also using the stool softener once a day.  She is taking her vitamin D, vitamin B12 each day.  She is not able to tolerate calcium but tries to eat a calcium rich diet.  She denies any myalgias with the cholesterol medication.  Since her last visit she was seen by cardiology and had a negative nuclear stress test.  She reports that she has been exercising and has lost 3 more pounds.  She denies any subsequent chest pain, shortness of breath, swelling in her extremities, or palpitations.  Reports that her blood pressure is running well.  Reports that she feels better than she has in quite some time.   Past Medical History:  Diagnosis Date  . Allergy   . Anemia    prior to hysterectomy  . Back pain    comepensating for knee pain per pt  . Cataract    right eye and immature  . Complication of anesthesia   . Constipation    takes  Colace daily and Milk of Mag  . CTS (carpal tunnel syndrome) 08/31/2016  . Dizziness    but goes away real quick  . Dry eyes    uses Eye Drops daily as needed  . GERD (gastroesophageal reflux disease)   . History of colon polyps   . Hyperlipidemia    was on meds but stopped taking back in June 2015  . Hypertension    takes Lisinopril daily  . Hypothyroidism    takes Synthroid daily  . Joint pain   . Peripheral arterial disease ([place])   . Pneumonia    as a baby  . PONV (postoperative nausea and vomiting)   . Primary localized osteoarthritis of left knee 04/17/2015  . Primary localized osteoarthritis of right knee    knees  . Urinary frequency   . Urinary urgency     Past Surgical History:  Procedure Laterality Date  . ABDOMINAL HYSTERECTOMY  90's   complete  . APPENDECTOMY  80's  . BACK SURGERY    . CATARACT EXTRACTION W/PHACO Right 12/23/2015   Procedure: CATARACT EXTRACTION PHACO AND INTRAOCULAR LENS PLACEMENT RIGHT EYE CDE=6.42;  Surgeon: Williams Che, MD;  Location: [facility];  Service: Ophthalmology;  Laterality: Right;  . COLONOSCOPY  09/09/2004   PFX:[id number] rectum and colon  .  COLONOSCOPY N/A 11/08/2013   Procedure: COLONOSCOPY;  Surgeon: Daneil Dolin, MD;  [facility];  Service: Endoscopy;  Laterality: N/A;  10:45  . JOINT REPLACEMENT     September 07, 2013, 09-07-2014  . NECK SURGERY  1983  . RECTOCELE REPAIR  10/2007  . SPINE SURGERY     ruptured disc  . TOTAL KNEE ARTHROPLASTY Right 04/04/2014   DR Noemi Chapel  . TOTAL KNEE ARTHROPLASTY Right 04/04/2014   Procedure: RIGHT TOTAL KNEE ARTHROPLASTY;  Surgeon: Lorn Junes, MD;  Location: [place];  Service: Orthopedics;  Laterality: Right;  . TOTAL KNEE ARTHROPLASTY Left 04/29/2015   Procedure: TOTAL KNEE ARTHROPLASTY;  Surgeon: Elsie Saas, MD;  Location: [place];  Service: Orthopedics;  Laterality: Left;  . TUBAL LIGATION      Family History  Problem Relation Age of Onset  . Colon cancer Sister        age 53s  .  Myasthenia gravis Sister   . Bladder Cancer Brother   . Lung cancer Brother        age 35  . Alzheimer's disease Mother   . COPD Father   . Asthma Father   . Alcohol abuse Brother        likely died of pneumonia     Social History   Socioeconomic History  . Marital status: Widowed    Spouse name: Not on file  . Number of children: 2  . Years of education: 80  . Highest education level: Not on file  Occupational History  . Occupation: retired    Comment: Doctor, general practice  Social Needs  . Financial resource strain: Not hard at all  . Food insecurity:    Worry: Never true    Inability: Never true  . Transportation needs:    Medical: No    Non-medical: No  Tobacco Use  . Smoking status: Never Smoker  . Smokeless tobacco: Never Used  Substance and Sexual Activity  . Alcohol use: No  . Drug use: No  . Sexual activity: Never    Birth control/protection: Surgical  Lifestyle  . Physical activity:    Days per week: 5 days    Minutes per session: 10 min  . Stress: Not at all  Relationships  . Social connections:    Talks on phone: Once a week    Gets together: Once a week    Attends religious service: More than 4 times per year    Active member of club or organization: Not on file    Attends meetings of clubs or organizations: 1 to 4 times per year    Relationship status: Widowed  Other Topics Concern  . Not on file  Social History Narrative   Husband died after over 62 years and then a year after husband died, she met a man who she had a relationship with for over ten years. He died in Sep 07, 2017 after long illness.   Live alone.    Has two children, grown sons.    Gets out with neices, enjoys going out to eat, going to [employer].   Goes to church.    Eats all food groups, eats some meat.       Current Outpatient Medications on File Prior to Visit  Medication Sig Dispense Refill  . aspirin EC 81 MG tablet Take 81 mg by mouth daily.    . cholecalciferol (VITAMIN  D) 1000 units tablet Take 2,000 Units by mouth daily.    . Cyanocobalamin (VITAMIN B  12 PO) Take 1 tablet by mouth daily.     Marland Kitchen docusate sodium (COLACE) 100 MG capsule Take 100 mg by mouth daily.     Marland Kitchen levothyroxine (SYNTHROID, LEVOTHROID) 88 MCG tablet Take 1 tablet (88 mcg total) by mouth daily. 90 tablet 1  . lisinopril-hydrochlorothiazide (PRINZIDE,ZESTORETIC) 10-12.5 MG tablet Take 0.5 tablets by mouth daily. 90 tablet 3  . omeprazole (PRILOSEC) 20 MG capsule Take 1 capsule (20 mg total) by mouth daily. 90 capsule 2  . pravastatin (PRAVACHOL) 20 MG tablet Take 1 tablet (20 mg total) by mouth daily. 90 tablet 3   No current facility-administered medications on file prior to visit.     Review of Systems  Constitutional: Negative for activity change, appetite change and fever.  Eyes: Negative for visual disturbance.  Respiratory: Negative for cough, chest tightness and shortness of breath.   Cardiovascular: Negative for chest pain, palpitations and leg swelling.  Gastrointestinal: Negative for abdominal pain, nausea and vomiting.       Eating a Prilosec p.o. daily.  Denies any reflux or epigastric pain.  Genitourinary: Negative for dysuria, frequency and urgency.  Musculoskeletal: Negative for back pain and myalgias.  Neurological: Negative for dizziness, syncope and light-headedness.  Hematological: Negative for adenopathy.  Psychiatric/Behavioral: Negative for dysphoric mood. The patient is not nervous/anxious.        Reports that her mood is good and she has been being more interactive with family and friends since the death of her partner.  She reports she does not feel down, depressed, or hopeless.  Is exercising.  Reports mood is good.  Sleeping fairly well.     Objective:   BP 110/68 (BP Location: Left Arm, Patient Position: Sitting, Cuff Size: Large)   Pulse (!) 57   Temp 97.8 F (36.6 C) (Temporal)   Resp 16   Ht 5' 3.5" (1.613 m)   Wt 183 lb 1.3 oz (83 kg)   LMP  07/20/1990 (Approximate)   SpO2 97%   BMI 31.92 kg/m   Physical Exam  Constitutional: She is oriented to person, place, and time. She appears well-developed and well-nourished.  Eyes: Pupils are equal, round, and reactive to light. EOM are normal.  Cardiovascular: Normal rate, regular rhythm and normal heart sounds.  Pulmonary/Chest: Effort normal and breath sounds normal.  Abdominal: Soft. Bowel sounds are normal.  Neurological: She is alert and oriented to person, place, and time.  Skin: Capillary refill takes less than 2 seconds. No rash noted.  Psychiatric: She has a normal mood and affect. Her behavior is normal. Judgment and thought content normal.     Assessment and Plan  1. Hypothyroidism, unspecified type Continue Synthroid 88 mcg a day.  Follow-up with new PCP for recheck of TSH in 2 to 3 months.  2. Essential hypertension Blood pressure stable.  Due for electrolyte check with BMP in August/2019.  Continue with diet and exercise modification. Lifestyle modifications discussed with patient including a diet emphasizing vegetables, fruits, and whole grains. Limiting intake of sodium to less than 2,400 mg per day.  Recommendations discussed include consuming low-fat dairy products, poultry, fish, legumes, non-tropical vegetable oils, and nuts; and limiting intake of sweets, sugar-sweetened beverages, and red meat. Discussed following a plan such as the Dietary Approaches to Stop Hypertension (DASH) diet. Patient to read up on this diet.    3. Hyperlipidemia, unspecified hyperlipidemia type Continue medication as directed.  Due for LFTs in August/2019.  4. Class 1 obesity due to excess calories without serious  comorbidity with body mass index (BMI) of 32.0 to 32.9 in adult She was congratulated and encouraged on her continued weight loss.  Continued weight loss is recommended.  Diet, exercise, and lifestyle modifications recommended.  No follow-ups on file.  She will keep her  scheduled follow-up with new PCP for August/September 2019.  In the meantime, she will call with any questions or concerns.  Flu shot is recommended in the fall. Caren Macadam, MD 02/10/2018

## 2018-02-10 NOTE — Patient Instructions (Addendum)
Follow up with  Dr. Morrison Old and have your thyroid checked in 03/2018. Get your kidneys/electrolytes checked at your follow up.   Continue all of your medications.

## 2018-03-16 ENCOUNTER — Other Ambulatory Visit (HOSPITAL_COMMUNITY): Payer: Self-pay | Admitting: Internal Medicine

## 2018-03-16 ENCOUNTER — Ambulatory Visit: Payer: Medicare Other | Admitting: Cardiovascular Disease

## 2018-03-16 ENCOUNTER — Encounter: Payer: Self-pay | Admitting: Cardiovascular Disease

## 2018-03-16 VITALS — BP 114/70 | HR 62 | Ht 63.0 in | Wt 183.0 lb

## 2018-03-16 DIAGNOSIS — E782 Mixed hyperlipidemia: Secondary | ICD-10-CM | POA: Diagnosis not present

## 2018-03-16 DIAGNOSIS — I10 Essential (primary) hypertension: Secondary | ICD-10-CM

## 2018-03-16 DIAGNOSIS — K219 Gastro-esophageal reflux disease without esophagitis: Secondary | ICD-10-CM

## 2018-03-16 DIAGNOSIS — R079 Chest pain, unspecified: Secondary | ICD-10-CM | POA: Diagnosis not present

## 2018-03-16 DIAGNOSIS — Z1231 Encounter for screening mammogram for malignant neoplasm of breast: Secondary | ICD-10-CM

## 2018-03-16 DIAGNOSIS — E039 Hypothyroidism, unspecified: Secondary | ICD-10-CM

## 2018-03-16 NOTE — Patient Instructions (Signed)
Medication Instructions:   Your physician recommends that you continue on your current medications as directed. Please refer to the Current Medication list given to you today.  Labwork:  none  Testing/Procedures:  none  Follow-Up:  Your physician recommends that you schedule a follow-up appointment in: as needed.  Any Other Special Instructions Will Be Listed Below (If Applicable).  If you need a refill on your cardiac medications before your next appointment, please call your pharmacy. 

## 2018-03-16 NOTE — Progress Notes (Signed)
SUBJECTIVE: The patient returns for follow-up after undergoing cardiovascular testing performed for the evaluation of chest pain.  She underwent a low risk exercise Myoview stress test on 01/05/2018.  She had limited exercise tolerance.  There was a hypertensive response to exercise.  LVEF 85%.  I prescribed omeprazole at her last visit and her chest pains and GERD have significantly subsided.  She also noticed that if she makes her coffee strong it bothers her.  She denies shortness of breath and palpitations.      Review of Systems: As per "subjective", otherwise negative.  Allergies  Allergen Reactions  . Codeine Nausea And Vomiting  . Crestor [Rosuvastatin] Other (See Comments)    Body aches  . Flexall [Menthol (Topical Analgesic)] Rash  . Penicillins Rash    Current Outpatient Medications  Medication Sig Dispense Refill  . aspirin EC 81 MG tablet Take 81 mg by mouth daily.    . cholecalciferol (VITAMIN D) 1000 units tablet Take 2,000 Units by mouth daily.    . Cyanocobalamin (VITAMIN B 12 PO) Take 1 tablet by mouth daily.     Marland Kitchen docusate sodium (COLACE) 100 MG capsule Take 100 mg by mouth daily.     Marland Kitchen levothyroxine (SYNTHROID, LEVOTHROID) 88 MCG tablet Take 1 tablet (88 mcg total) by mouth daily. 90 tablet 1  . lisinopril-hydrochlorothiazide (PRINZIDE,ZESTORETIC) 10-12.5 MG tablet Take 0.5 tablets by mouth daily. 90 tablet 3  . omeprazole (PRILOSEC) 20 MG capsule Take 1 capsule (20 mg total) by mouth daily. 90 capsule 2  . pravastatin (PRAVACHOL) 20 MG tablet Take 1 tablet (20 mg total) by mouth daily. 90 tablet 3   No current facility-administered medications for this visit.     Past Medical History:  Diagnosis Date  . Allergy   . Anemia    prior to hysterectomy  . Back pain    comepensating for knee pain per pt  . Cataract    right eye and immature  . Complication of anesthesia   . Constipation    takes Colace daily and Milk of Mag  . CTS (carpal tunnel  syndrome) 08/31/2016  . Dizziness    but goes away real quick  . Dry eyes    uses Eye Drops daily as needed  . GERD (gastroesophageal reflux disease)   . History of colon polyps   . Hyperlipidemia    was on meds but stopped taking back in June 2015  . Hypertension    takes Lisinopril daily  . Hypothyroidism    takes Synthroid daily  . Joint pain   . Peripheral arterial disease ([place])   . Pneumonia    as a baby  . PONV (postoperative nausea and vomiting)   . Primary localized osteoarthritis of left knee 04/17/2015  . Primary localized osteoarthritis of right knee    knees  . Urinary frequency   . Urinary urgency     Past Surgical History:  Procedure Laterality Date  . ABDOMINAL HYSTERECTOMY  90's   complete  . APPENDECTOMY  80's  . BACK SURGERY    . CATARACT EXTRACTION W/PHACO Right 12/23/2015   Procedure: CATARACT EXTRACTION PHACO AND INTRAOCULAR LENS PLACEMENT RIGHT EYE CDE=6.42;  Surgeon: Williams Che, MD;  Location: [facility];  Service: Ophthalmology;  Laterality: Right;  . COLONOSCOPY  09/09/2004   NAT:FTDDUK rectum and colon  . COLONOSCOPY N/A 11/08/2013   Procedure: COLONOSCOPY;  Surgeon: Daneil Dolin, MD;  Location: [facility];  Service: Endoscopy;  Laterality: N/A;  10:45  . JOINT REPLACEMENT     10/08/13, 2014/10/08  . NECK SURGERY  1983  . RECTOCELE REPAIR  10/2007  . SPINE SURGERY     ruptured disc  . TOTAL KNEE ARTHROPLASTY Right 04/04/2014   DR Noemi Chapel  . TOTAL KNEE ARTHROPLASTY Right 04/04/2014   Procedure: RIGHT TOTAL KNEE ARTHROPLASTY;  Surgeon: Lorn Junes, MD;  Location: [facility];  Service: Orthopedics;  Laterality: Right;  . TOTAL KNEE ARTHROPLASTY Left 04/29/2015   Procedure: TOTAL KNEE ARTHROPLASTY;  Surgeon: Elsie Saas, MD;  Location: [place];  Service: Orthopedics;  Laterality: Left;  . TUBAL LIGATION      Social History   Socioeconomic History  . Marital status: Widowed    Spouse name: Not on file  . Number of children: 2  . Years of education:  17  . Highest education level: Not on file  Occupational History  . Occupation: retired    Comment: Doctor, general practice  Social Needs  . Financial resource strain: Not hard at all  . Food insecurity:    Worry: Never true    Inability: Never true  . Transportation needs:    Medical: No    Non-medical: No  Tobacco Use  . Smoking status: Never Smoker  . Smokeless tobacco: Never Used  Substance and Sexual Activity  . Alcohol use: No  . Drug use: No  . Sexual activity: Never    Birth control/protection: Surgical  Lifestyle  . Physical activity:    Days per week: 5 days    Minutes per session: 10 min  . Stress: Not at all  Relationships  . Social connections:    Talks on phone: Once a week    Gets together: Once a week    Attends religious service: More than 4 times per year    Active member of club or organization: Not on file    Attends meetings of clubs or organizations: 1 to 4 times per year    Relationship status: Widowed  . Intimate partner violence:    Fear of current or ex partner: No    Emotionally abused: No    Physically abused: No    Forced sexual activity: No  Other Topics Concern  . Not on file  Social History Narrative   Husband died after over 76 years and then a year after husband died, she met a man who she had a relationship with for over ten years. He died in 10/08/17 after long illness.   Live alone.    Has two children, grown sons.    Gets out with neices, enjoys going out to eat, going to [employer].   Goes to church.    Eats all food groups, eats some meat.         Vitals:   03/16/18 1124  BP: 114/70  Pulse: 62  SpO2: 96%  Weight: 183 lb (83 kg)  Height: '5\' 3"'  (1.6 m)    Wt Readings from Last 3 Encounters:  03/16/18 183 lb (83 kg)  02/10/18 183 lb 1.3 oz (83 kg)  12/30/17 185 lb 6.4 oz (84.1 kg)     PHYSICAL EXAM General: NAD HEENT: Normal. Neck: No JVD, no thyromegaly. Lungs: Clear to auscultation bilaterally with normal  respiratory effort. CV: Regular rate and rhythm, normal S1/S2, no S3/S4, no murmur. No pretibial or periankle edema.    Abdomen: Soft, nontender, no distention.  Neurologic: Alert and oriented.  Psych: Normal affect. Skin: Normal. Musculoskeletal: No gross  deformities.    ECG: Reviewed above under Subjective   Labs: Lab Results  Component Value Date/Time   K 4.3 09/22/2017 11:15 AM   BUN 21 09/22/2017 11:15 AM   CREATININE 0.97 (H) 09/22/2017 11:15 AM   ALT 14 09/22/2017 11:15 AM   TSH 0.23 (L) 12/30/2017 11:58 AM   HGB 14.0 09/22/2017 11:15 AM     Lipids: Lab Results  Component Value Date/Time   LDLCALC 88 09/22/2017 11:15 AM   CHOL 158 09/22/2017 11:15 AM   TRIG 143 09/22/2017 11:15 AM   HDL 46 (L) 09/22/2017 11:15 AM       ASSESSMENT AND PLAN: 1.  Chest pain: Symptoms have resolved with omeprazole 20 mg daily.  Nuclear stress test was low risk as detailed above.  No further cardiac testing is indicated.  2. PVD: Moderate bilateral external iliac artery stenosis with normal ABIs noted in October 2016.  Denies claudication pain.  On ASA and statin.  3. Hypertension: Blood pressure is normal.  No changes to therapy.  4. Hyperlipidemia: TC 158, HDL 46, TG 143, LDL 88 on 09/22/17. Currently taking pravastatin 20 mg.  No changes to therapy at present.  5. Hypothyroidism: TSH normal at 0.84 on 09/22/17.  6.  GERD: Symptoms have resolved with omeprazole 20 mg daily.  No changes indicated.    Disposition: Follow up as needed   Kate Sable, M.D., F.A.C.C.

## 2018-03-17 ENCOUNTER — Encounter (HOSPITAL_COMMUNITY): Payer: Self-pay

## 2018-03-17 ENCOUNTER — Ambulatory Visit (HOSPITAL_COMMUNITY)
Admission: RE | Admit: 2018-03-17 | Discharge: 2018-03-17 | Disposition: A | Discharge status: Home / Self Care | Payer: Medicare Other | Source: Ambulatory Visit | Attending: Internal Medicine | Admitting: Internal Medicine

## 2018-03-17 DIAGNOSIS — Z1231 Encounter for screening mammogram for malignant neoplasm of breast: Secondary | ICD-10-CM

## 2018-11-03 ENCOUNTER — Ambulatory Visit (INDEPENDENT_AMBULATORY_CARE_PROVIDER_SITE_OTHER): Payer: Medicare Other | Admitting: Internal Medicine

## 2018-12-01 ENCOUNTER — Ambulatory Visit (INDEPENDENT_AMBULATORY_CARE_PROVIDER_SITE_OTHER): Payer: Medicare Other | Admitting: Nurse Practitioner

## 2019-03-21 ENCOUNTER — Other Ambulatory Visit (HOSPITAL_COMMUNITY): Payer: Self-pay | Admitting: Internal Medicine

## 2019-03-29 ENCOUNTER — Other Ambulatory Visit: Payer: Self-pay

## 2019-03-29 ENCOUNTER — Encounter (INDEPENDENT_AMBULATORY_CARE_PROVIDER_SITE_OTHER): Payer: Self-pay | Admitting: Internal Medicine

## 2019-03-29 ENCOUNTER — Ambulatory Visit (INDEPENDENT_AMBULATORY_CARE_PROVIDER_SITE_OTHER): Payer: Medicare Other | Admitting: Internal Medicine

## 2019-03-29 VITALS — BP 140/78 | HR 72 | Ht 63.0 in | Wt 184.0 lb

## 2019-03-29 DIAGNOSIS — E559 Vitamin D deficiency, unspecified: Secondary | ICD-10-CM

## 2019-03-29 DIAGNOSIS — I10 Essential (primary) hypertension: Secondary | ICD-10-CM

## 2019-03-29 DIAGNOSIS — E782 Mixed hyperlipidemia: Secondary | ICD-10-CM | POA: Diagnosis not present

## 2019-03-29 DIAGNOSIS — K2100 Gastro-esophageal reflux disease with esophagitis, without bleeding: Secondary | ICD-10-CM

## 2019-03-29 DIAGNOSIS — E66811 Obesity, class 1: Secondary | ICD-10-CM | POA: Insufficient documentation

## 2019-03-29 DIAGNOSIS — E2839 Other primary ovarian failure: Secondary | ICD-10-CM

## 2019-03-29 DIAGNOSIS — F5101 Primary insomnia: Secondary | ICD-10-CM

## 2019-03-29 DIAGNOSIS — E669 Obesity, unspecified: Secondary | ICD-10-CM

## 2019-03-29 DIAGNOSIS — E039 Hypothyroidism, unspecified: Secondary | ICD-10-CM

## 2019-03-29 NOTE — Patient Instructions (Addendum)
MELATONIN:Go to [email or web address] and look for melatonin 1 mg capsules. Take 1 capsule every night to see if it will help you for 2-3 nights and then increase the dose as appropriate.  [facility] Dietary Recommendations for Weight Loss What to Avoid . Avoid added sugars o Often added sugar can be found in processed foods such as many condiments, dry cereals, cakes, cookies, chips, crisps, crackers, candies, sweetened drinks, etc.  o Read labels and AVOID/DECREASE use of foods with the following in their ingredient list: Sugar, fructose, high fructose corn syrup, sucrose, glucose, maltose, dextrose, molasses, cane sugar, brown sugar, any type of syrup, agave nectar, etc.   . Avoid snacking in between meals . Avoid foods made with flour o If you are going to eat food made with flour, choose those made with whole-grains; and, minimize your consumption as much as is tolerable . Avoid processed foods o These foods are generally stocked in the middle of the grocery store. Focus on shopping on the perimeter of the grocery.  What to Include . Vegetables o GREEN LEAFY VEGETABLES: Kale, spinach, mustard greens, collard greens, cabbage, broccoli, etc. o OTHER: Asparagus, cauliflower, eggplant, carrots, peas, Brussel sprouts, tomatoes, bell peppers, zucchini, beets, cucumbers, etc. . Grains, seeds, and legumes o Beans: kidney beans, black eyed peas, garbanzo beans, black beans, pinto beans, etc. o Whole, unrefined grains: brown rice, barley, bulgur, oatmeal, etc. . Healthy fats  o Avoid highly processed fats such as vegetable oil o Examples of healthy fats: avocado, olives, virgin olive oil, dark chocolate (?72% Cocoa), nuts (peanuts, almonds, walnuts, cashews, pecans, etc.) . Low - Moderate Intake of Animal Sources of Protein o Meat sources: chicken, [place], salmon, tuna. Limit to 4 ounces of meat at one time. o Consider limiting dairy sources, but when choosing dairy focus on: PLAIN [place]  yogurt, cottage cheese, high-protein milk . Fruit o Choose berries  When to Eat . Intermittent Fasting: o Choosing not to eat for a specific time period, but DO FOCUS ON HYDRATION when fasting o Multiple Techniques: - Time Restricted Eating: eat 3 meals in a day, each meal lasting no more than 60 minutes, no snacks between meals - 16-18 hour fast: fast for 16 to 18 hours up to 7 days a week. Often suggested to start with 2-3 nonconsecutive days per week.  . Remember the time you sleep is counted as fasting.  . Examples of eating schedule: Fast from 7:00pm-11:00am. Eat between 11:00am-7:00pm.  - 24-hour fast: fast for 24 hours up to every other day. Often suggested to start with 1 day per week . Remember the time you sleep is counted as fasting . Examples of eating schedule:  o Eating day: eat 2-3 meals on your eating day. If doing 2 meals, each meal should last no more than 90 minutes. If doing 3 meals, each meal should last no more than 60 minutes. Finish last meal by 7:00pm. o Fasting day: Fast until 7:00pm.  o IF YOU FEEL UNWELL FOR ANY REASON/IN ANY WAY WHEN FASTING, STOP FASTING BY EATING A NUTRITIOUS SNACK OR LIGHT MEAL o ALWAYS FOCUS ON HYDRATION DURING FASTS - Acceptable Hydration sources: water, broths, tea/coffee (black tea/coffee is best but using a small amount of whole-fat dairy products in coffee/tea is acceptable).  - Poor Hydration Sources: anything with sugar or artificial sweeteners added to it  These recommendations have been developed for patients that are actively receiving medical care from either Dr. Anastasio Champion or Jeralyn Ruths, DNP, NP-C at  [facility]. These recommendations are developed for patients with specific medical conditions and are not meant to be distributed or used by others that are not actively receiving care from either provider listed above at [facility]. It is not appropriate to participate in the above eating plans without proper medical  supervision.   Reference: Rexanne Mano. The obesity code. [place]; 2016.

## 2019-03-29 NOTE — Progress Notes (Signed)
Wellness Office Visit  Subjective:  Patient ID: [name], female    DOB: Jul 28, 1940  Age: 78 y.o. MRN: [id number]  CC: This lady comes in for follow-up of her multiple medical problems including hypothyroidism, hyperlipidemia, hypertension, obesity, vitamin D deficiency, postmenopausal symptoms, gastroesophageal reflux disease. HPI She is frustrated that she is not able to lose weight and she tells me that she does do intermittent fasting usually 14 to 16 hours every day.  Unfortunately, when she does eat, I think that she tends to eat more unhealthy carbohydrates. She also feels that hormones are causing her to gain weight.  I did acknowledge that hormones can lead to fluid gain. She continues to complain of bilateral breast tenderness since she started taking bioidentical hormone therapy, which is a known side effect especially of estradiol. She continues on desiccated NP thyroid for hypothyroidism and is tolerating higher doses.  Her last free T3 was optimal at 5.3. She also admitted to me today that she does not sleep well and often gets up in the middle of the night.  She does tend to watch television from 7 PM to 10 PM before she goes to bed.  Past Medical History:  Diagnosis Date  . Allergy   . Anemia    prior to hysterectomy  . Back pain    comepensating for knee pain per pt  . Cataract    right eye and immature  . Complication of anesthesia   . Constipation    takes Colace daily and Milk of Mag  . CTS (carpal tunnel syndrome) 08/31/2016  . Dizziness    but goes away real quick  . Dry eyes    uses Eye Drops daily as needed  . Female hypogonadism syndrome   . GERD (gastroesophageal reflux disease)   . History of colon polyps   . Hyperlipidemia    was on meds but stopped taking back in June 2015  . Hypertension    takes Lisinopril daily  . Hypothyroidism    takes Synthroid daily  . Joint pain   . Obesity (BMI 30.0-34.9)   . Peripheral arterial disease ([place])    . Pneumonia    as a baby  . PONV (postoperative nausea and vomiting)   . Primary localized osteoarthritis of left knee 04/17/2015  . Primary localized osteoarthritis of right knee    knees  . Urinary frequency   . Urinary urgency       Family History  Problem Relation Age of Onset  . Colon cancer Sister        age 58s  . Myasthenia gravis Sister   . Bladder Cancer Brother   . Lung cancer Brother        age 25  . Alzheimer's disease Mother   . COPD Father   . Asthma Father   . Alcohol abuse Brother        likely died of pneumonia    Social History   Social History Narrative   Husband died after over 37 years and then a year after husband died, she met a man who she had a relationship with for over ten years. He died in 16-Oct-2017 after long illness.   Live alone.    Has two children, grown sons.    Gets out with neices, enjoys going out to eat, going to [employer].   Goes to church.    Eats all food groups, eats some meat.        Current Meds  Medication Sig  . aspirin EC 81 MG tablet Take 81 mg by mouth daily.  . Cholecalciferol (VITAMIN D-3) 125 MCG (5000 UT) TABS Take 10,000 Units by mouth daily at 12 noon.  . Cyanocobalamin (VITAMIN B 12 PO) Take 1 tablet by mouth daily.   Marland Kitchen estradiol (ESTRACE) 1 MG tablet Take 1 mg by mouth daily.  Marland Kitchen lisinopril (ZESTRIL) 10 MG tablet Take 10 mg by mouth daily.  . NP THYROID 15 MG tablet Take 15 mg by mouth daily.  . NP THYROID 90 MG tablet Take 90 mg by mouth daily.  Marland Kitchen omeprazole (PRILOSEC) 20 MG capsule Take 1 capsule (20 mg total) by mouth daily.  . progesterone (PROMETRIUM) 200 MG capsule Take 200 mg by mouth daily.       Objective:   Today's Vitals: BP 140/78   Pulse 72   Ht '5\' 3"'  (1.6 m)   Wt 184 lb (83.5 kg)   LMP 07/20/1990 (Approximate)   BMI 32.59 kg/m  Vitals with BMI 03/29/2019 03/16/2018 02/10/2018  Height '5\' 3"'  '5\' 3"'  5' 3.5"  Weight 184 lbs 183 lbs 183 lbs 1 oz  BMI 32.6 38.32 91.91  Systolic 660 600 459   Diastolic 78 70 68  Pulse 72 62 57     Physical Exam She looks systemically well.  Systolic blood pressure elevated today.  Alert and orientated without any focal neurological signs. Nutrition  As mentioned, although she does intermittent fasting, she eats somewhat unhealthy carbohydrates.  Exercise  No consistent exercise.  Bio Identical Hormones  Estradiol is being used in this patient for multiple benefits based on several studies including protection against heart disease, cerebrovascular disease, osteoporosis, colon cancer, Alzheimer's disease, macular degeneration and cataracts. The patient has been counseled regarding benefits and side effects and modes of administration. The patient is agreeable that this therapy is an integral to part of her wellness, quality of life and prevention of chronic disease.  Micronized progesterone is being used in this patient for multiple benefits based on studies including protection against uterine cancer, breast cancer, osteoporosis and heart disease. The patient has been counseled regarding side effects, benefits and modes of administration. The patient is agreeable that this therapy is an integral part of her wellness, quality of life and prevention of chronic disease.   Assessment   1. Acquired hypothyroidism   2. Mixed hyperlipidemia   3. Essential hypertension   4. Obesity (BMI 30.0-34.9)   5. Vitamin D deficiency   6. Female hypogonadism syndrome   7. Gastroesophageal reflux disease with esophagitis   8. Primary insomnia      Plan: 1. She will continue with desiccated NP thyroid as before as she seems to tolerate. 2. In terms of gastroesophageal reflux disease, she will continue with Prilosec but if her symptoms persist, which they appear to do so at the present time, I did offer her for gastroenterology referral but she would like to wait a while. 3. The breast tenderness continues to be an issue and I have told her to stop  taking estradiol but she can continue taking progesterone.  If her symptoms resolve, then she can then go ahead and do a screening mammogram which is what she needs to do.  In this case, she will then only continue with progesterone. 4. In terms of the insomnia I recommended sleep hygiene and I described this to her as well as the use of melatonin. 5. She will continue with vitamin D3 supplementation. 6. She will continue  with antihypertensive medic medication as before. 7. I will see her in about 3 months time for annual physical exam and we will do blood work then.  Tests ordered No orders of the defined types were placed in this encounter.    Doree Albee, MD

## 2019-04-20 ENCOUNTER — Other Ambulatory Visit (HOSPITAL_COMMUNITY): Payer: Self-pay | Admitting: Internal Medicine

## 2019-04-20 DIAGNOSIS — Z1231 Encounter for screening mammogram for malignant neoplasm of breast: Secondary | ICD-10-CM

## 2019-05-04 ENCOUNTER — Other Ambulatory Visit: Payer: Self-pay

## 2019-05-04 ENCOUNTER — Ambulatory Visit (HOSPITAL_COMMUNITY)
Admission: RE | Admit: 2019-05-04 | Discharge: 2019-05-04 | Disposition: A | Discharge status: Home / Self Care | Payer: Medicare Other | Source: Ambulatory Visit | Attending: Internal Medicine | Admitting: Internal Medicine

## 2019-05-04 ENCOUNTER — Ambulatory Visit (INDEPENDENT_AMBULATORY_CARE_PROVIDER_SITE_OTHER): Payer: Medicare Other

## 2019-05-04 DIAGNOSIS — Z23 Encounter for immunization: Secondary | ICD-10-CM

## 2019-05-04 DIAGNOSIS — Z1231 Encounter for screening mammogram for malignant neoplasm of breast: Secondary | ICD-10-CM

## 2019-05-09 ENCOUNTER — Other Ambulatory Visit (HOSPITAL_COMMUNITY): Payer: Self-pay | Admitting: Internal Medicine

## 2019-05-09 ENCOUNTER — Other Ambulatory Visit (INDEPENDENT_AMBULATORY_CARE_PROVIDER_SITE_OTHER): Payer: Self-pay | Admitting: Internal Medicine

## 2019-05-09 ENCOUNTER — Telehealth (INDEPENDENT_AMBULATORY_CARE_PROVIDER_SITE_OTHER): Payer: Self-pay

## 2019-05-09 DIAGNOSIS — R928 Other abnormal and inconclusive findings on diagnostic imaging of breast: Secondary | ICD-10-CM

## 2019-05-09 DIAGNOSIS — Z1231 Encounter for screening mammogram for malignant neoplasm of breast: Secondary | ICD-10-CM

## 2019-05-10 NOTE — Telephone Encounter (Signed)
Pt medication request of 90 day supply was sent via EHR to pharmacy of her choice.

## 2019-05-16 ENCOUNTER — Ambulatory Visit (HOSPITAL_COMMUNITY): Admission: RE | Admit: 2019-05-16 | Payer: Medicare Other | Source: Ambulatory Visit

## 2019-05-16 ENCOUNTER — Ambulatory Visit (HOSPITAL_COMMUNITY)
Admission: RE | Admit: 2019-05-16 | Discharge: 2019-05-16 | Disposition: A | Discharge status: Home / Self Care | Payer: Medicare Other | Source: Ambulatory Visit | Attending: Internal Medicine | Admitting: Internal Medicine

## 2019-05-16 ENCOUNTER — Other Ambulatory Visit: Payer: Self-pay

## 2019-05-16 DIAGNOSIS — R928 Other abnormal and inconclusive findings on diagnostic imaging of breast: Secondary | ICD-10-CM | POA: Insufficient documentation

## 2019-07-04 ENCOUNTER — Other Ambulatory Visit: Payer: Self-pay

## 2019-07-04 ENCOUNTER — Ambulatory Visit (INDEPENDENT_AMBULATORY_CARE_PROVIDER_SITE_OTHER): Payer: Medicare Other | Admitting: Internal Medicine

## 2019-07-04 ENCOUNTER — Encounter (INDEPENDENT_AMBULATORY_CARE_PROVIDER_SITE_OTHER): Payer: Self-pay | Admitting: Internal Medicine

## 2019-07-04 VITALS — BP 120/69 | HR 73 | Temp 97.9°F | Resp 18 | Ht 63.0 in | Wt 184.0 lb

## 2019-07-04 DIAGNOSIS — E782 Mixed hyperlipidemia: Secondary | ICD-10-CM | POA: Diagnosis not present

## 2019-07-04 DIAGNOSIS — E669 Obesity, unspecified: Secondary | ICD-10-CM

## 2019-07-04 DIAGNOSIS — E559 Vitamin D deficiency, unspecified: Secondary | ICD-10-CM

## 2019-07-04 DIAGNOSIS — E039 Hypothyroidism, unspecified: Secondary | ICD-10-CM | POA: Diagnosis not present

## 2019-07-04 DIAGNOSIS — Z0001 Encounter for general adult medical examination with abnormal findings: Secondary | ICD-10-CM | POA: Diagnosis not present

## 2019-07-04 DIAGNOSIS — I10 Essential (primary) hypertension: Secondary | ICD-10-CM

## 2019-07-04 NOTE — Progress Notes (Signed)
Chief Complaint: This 78 year old lady comes in for an annual physical exam and to address her chronic conditions which are described below. HPI: She has a history of hypertension, hypothyroidism, vitamin D deficiency. She continues to take antihypertensive therapy and this seems to control her blood pressure well.  She denies any chest pain, dyspnea, palpitations or limb weakness. She continues on desiccated NP thyroid for hypothyroidism. She continues with vitamin D3 supplementation for vitamin D deficiency. Her main concern right now is to try and lose weight.  Past Medical History:  Diagnosis Date  . Allergy   . Anemia    prior to hysterectomy  . Back pain    comepensating for knee pain per pt  . Cataract    right eye and immature  . Complication of anesthesia   . Constipation    takes Colace daily and Milk of Mag  . CTS (carpal tunnel syndrome) 08/31/2016  . Dizziness    but goes away real quick  . Dry eyes    uses Eye Drops daily as needed  . Female hypogonadism syndrome   . GERD (gastroesophageal reflux disease)   . History of colon polyps   . Hyperlipidemia    was on meds but stopped taking back in June 2015  . Hypertension    takes Lisinopril daily  . Hypothyroidism    takes Synthroid daily  . Joint pain   . Obesity (BMI 30.0-34.9)   . Peripheral arterial disease ([place])   . Pneumonia    as a baby  . PONV (postoperative nausea and vomiting)   . Primary localized osteoarthritis of left knee 04/17/2015  . Primary localized osteoarthritis of right knee    knees  . Urinary frequency   . Urinary urgency    Past Surgical History:  Procedure Laterality Date  . ABDOMINAL HYSTERECTOMY  90's   complete  . APPENDECTOMY  80's  . BACK SURGERY    . CATARACT EXTRACTION W/PHACO Right 12/23/2015   Procedure: CATARACT EXTRACTION PHACO AND INTRAOCULAR LENS PLACEMENT RIGHT EYE CDE=6.42;  Surgeon: Williams Che, MD;  Location: [facility];  Service: Ophthalmology;  Laterality:  Right;  . COLONOSCOPY  09/09/2004   [id number] rectum and colon  . COLONOSCOPY N/A 11/08/2013   Procedure: COLONOSCOPY;  Surgeon: Daneil Dolin, MD;  Location: [facility];  Service: Endoscopy;  Laterality: N/A;  10:45  . JOINT REPLACEMENT     2015, 2016  . NECK SURGERY  1983  . RECTOCELE REPAIR  10/2007  . SPINE SURGERY     ruptured disc  . TOTAL KNEE ARTHROPLASTY Right 04/04/2014   DR Noemi Chapel  . TOTAL KNEE ARTHROPLASTY Right 04/04/2014   Procedure: RIGHT TOTAL KNEE ARTHROPLASTY;  Surgeon: Lorn Junes, MD;  Location: [facility];  Service: Orthopedics;  Laterality: Right;  . TOTAL KNEE ARTHROPLASTY Left 04/29/2015   Procedure: TOTAL KNEE ARTHROPLASTY;  Surgeon: Elsie Saas, MD;  Location: [facility];  Service: Orthopedics;  Laterality: Left;  . TUBAL LIGATION       Social History   Social History Narrative   Husband died after over 36 years and then a year after husband died, she met a man who she had a relationship with for over ten years. He died in 10-14-17 after long illness.   Live alone.    Has two children, grown sons.    Gets out with neices, enjoys going out to eat, going to [employer].   Goes to church.    Eats all food  groups, eats some meat.        Social History   Tobacco Use  . Smoking status: Never Smoker  . Smokeless tobacco: Never Used  Substance Use Topics  . Alcohol use: No      Allergies:  Allergies  Allergen Reactions  . Codeine Nausea And Vomiting  . Crestor [Rosuvastatin] Other (See Comments)    Body aches  . Flexall [Menthol (Topical Analgesic)] Rash  . Penicillins Rash     Current Meds  Medication Sig  . aspirin EC 81 MG tablet Take 81 mg by mouth daily.  . Cholecalciferol (VITAMIN D-3) 125 MCG (5000 UT) TABS Take 10,000 Units by mouth daily at 12 noon.  . Cyanocobalamin (VITAMIN B 12 PO) Take 1 tablet by mouth daily.   Marland Kitchen lisinopril (ZESTRIL) 10 MG tablet TAKE ONE (1) TABLET BY MOUTH EVERY DAY  . NP THYROID 15 MG tablet TAKE ONE (1)  TABLET BY MOUTH EVERY DAY  . NP THYROID 90 MG tablet TAKE ONE (1) TABLET BY MOUTH EVERY DAY      UJW:[id number] from the symptoms mentioned above,there are no other symptoms referable to all systems reviewed.  Physical Exam: Blood pressure 120/69, pulse 73, temperature 97.9 F (36.6 C), temperature source Temporal, resp. rate 18, height 5' 3" (1.6 m), weight 184 lb (83.5 kg), last menstrual period 07/20/1990, SpO2 98 %. Vitals with BMI 07/04/2019 03/29/2019 03/16/2018  Height 5' 3" 5' 3" 5' 3"  Weight 184 lbs 184 lbs 183 lbs  BMI 32.6 78.2 95.62  Systolic 130 865 784  Diastolic 69 78 70  Pulse 73 72 62      She looks systemically well for her age and remains obese.  She has not lost any further weight since the last time I saw her. General: Alert, cooperative, and appears to be the stated age.No pallor.  No jaundice.  No clubbing. Head: Normocephalic Eyes: Sclera [place], pupils equal and reactive to light, red reflex x 2,  Ears: Normal bilaterally Oral cavity: Lips, mucosa, and tongue normal: Teeth and gums normal Neck: No adenopathy, supple, symmetrical, trachea midline, and thyroid does not appear enlarged. Breast: No masses felt. Respiratory: Clear to auscultation bilaterally.No wheezing, crackles or bronchial breathing. Cardiovascular: Heart sounds are present and appear to be normal without murmurs or added sounds.  No carotid bruits.  Peripheral pulses are present and equal bilaterally.: Gastrointestinal:positive bowel sounds, no hepatosplenomegaly.  No masses felt.No tenderness. Skin: Clear, No rashes noted.No worrisome skin lesions seen. Neurological: Grossly intact without focal findings, cranial nerves II through XII intact, muscle strength equal bilaterally Musculoskeletal: No acute joint abnormalities noted.Full range of movement noted with joints. Psychiatric: Affect appropriate, non-anxious.    Assessment  1. Essential hypertension   2. Acquired hypothyroidism   3.  Mixed hyperlipidemia   4. Obesity (BMI 30.0-34.9)   5. Encounter for general adult medical examination with abnormal findings   6. Vitamin D deficiency disease     Tests Ordered:   Orders Placed This Encounter  Procedures  . CBC  . CMP with eGFR([facility])  . T3, Free  . TSH  . Vitamin D, 25-hydroxy  . Lipid Panel     Plan  1. Blood work is ordered as above. 2. She will continue with antihypertensive therapy and her blood pressure is stable. 3. She will continue with desiccated thyroid and we will check levels today. 4. She will continue vitamin D3 supplementation and we will check levels today. 5. She did not really tolerate bioidentical  hormones with estradiol and progesterone and we will leave them off for the time being. 6. Further recommendations will depend on blood results and I will see her in 3 months time for follow-up.  We discussed nutrition again and I have given her a diet sheet today. 7. Today, in addition to a preventative visit, I performed an office visit to address her chronic conditions above.     No orders of the defined types were placed in this encounter.    Pryor Guettler C Monquie Fulgham   07/04/2019, 1:26 PM

## 2019-07-04 NOTE — Patient Instructions (Signed)
[name] Dietary Recommendations for Weight Loss What to Avoid . Avoid added sugars o Often added sugar can be found in processed foods such as many condiments, dry cereals, cakes, cookies, chips, crisps, crackers, candies, sweetened drinks, etc.  o Read labels and AVOID/DECREASE use of foods with the following in their ingredient list: Sugar, fructose, high fructose corn syrup, sucrose, glucose, maltose, dextrose, molasses, cane sugar, brown sugar, any type of syrup, agave nectar, etc.   . Avoid snacking in between meals . Avoid foods made with flour o If you are going to eat food made with flour, choose those made with whole-grains; and, minimize your consumption as much as is tolerable . Avoid processed foods o These foods are generally stocked in the middle of the grocery store. Focus on shopping on the perimeter of the grocery.  What to Include . Vegetables o GREEN LEAFY VEGETABLES: Kale, spinach, mustard greens, collard greens, cabbage, broccoli, etc. o OTHER: Asparagus, cauliflower, eggplant, carrots, peas, Brussel sprouts, tomatoes, bell peppers, zucchini, beets, cucumbers, etc. . Grains, seeds, and legumes o Beans: kidney beans, black eyed peas, garbanzo beans, black beans, pinto beans, etc. o Whole, unrefined grains: brown rice, barley, bulgur, oatmeal, etc. . Healthy fats  o Avoid highly processed fats such as vegetable oil o Examples of healthy fats: avocado, olives, virgin olive oil, dark chocolate (?72% Cocoa), nuts (peanuts, almonds, walnuts, cashews, pecans, etc.) . Low - Moderate Intake of Animal Sources of Protein o Meat sources: chicken, turkey, salmon, tuna. Limit to 4 ounces of meat at one time. o Consider limiting dairy sources, but when choosing dairy focus on: PLAIN Greek yogurt, cottage cheese, high-protein milk . Fruit o Choose berries  When to Eat . Intermittent Fasting: o Choosing not to eat for a specific time period, but DO FOCUS ON HYDRATION  when fasting o Multiple Techniques: - Time Restricted Eating: eat 3 meals in a day, each meal lasting no more than 60 minutes, no snacks between meals - 16-18 hour fast: fast for 16 to 18 hours up to 7 days a week. Often suggested to start with 2-3 nonconsecutive days per week.  . Remember the time you sleep is counted as fasting.  . Examples of eating schedule: Fast from 7:00pm-11:00am. Eat between 11:00am-7:00pm.  - 24-hour fast: fast for 24 hours up to every other day. Often suggested to start with 1 day per week . Remember the time you sleep is counted as fasting . Examples of eating schedule:  o Eating day: eat 2-3 meals on your eating day. If doing 2 meals, each meal should last no more than 90 minutes. If doing 3 meals, each meal should last no more than 60 minutes. Finish last meal by 7:00pm. o Fasting day: Fast until 7:00pm.  o IF YOU FEEL UNWELL FOR ANY REASON/IN ANY WAY WHEN FASTING, STOP FASTING BY EATING A NUTRITIOUS SNACK OR LIGHT MEAL o ALWAYS FOCUS ON HYDRATION DURING FASTS - Acceptable Hydration sources: water, broths, tea/coffee (black tea/coffee is best but using a small amount of whole-fat dairy products in coffee/tea is acceptable).  - Poor Hydration Sources: anything with sugar or artificial sweeteners added to it  These recommendations have been developed for patients that are actively receiving medical care from either Dr. Lorene Samaan or Sarah Gray, DNP, NP-C at [name]. These recommendations are developed for patients with specific medical conditions and are not meant to be distributed or used by others that are not actively receiving care from either provider listed   above at [facility]. It is not appropriate to participate in the above eating plans without proper medical supervision.   Reference: Fung, J. The obesity code. [place]: [place]; 2016.   

## 2019-07-05 ENCOUNTER — Encounter (INDEPENDENT_AMBULATORY_CARE_PROVIDER_SITE_OTHER): Payer: Self-pay | Admitting: Internal Medicine

## 2019-07-05 LAB — COMPLETE METABOLIC PANEL WITH GFR
AG Ratio: 1.5 (calc) (ref 1.0–2.5)
ALT: 13 U/L (ref 6–29)
AST: 17 U/L (ref 10–35)
Albumin: 3.9 g/dL (ref 3.6–5.1)
Alkaline phosphatase (APISO): 66 U/L (ref 37–153)
BUN: 17 mg/dL (ref 7–25)
CO2: 23 mmol/L (ref 20–32)
Calcium: 9.5 mg/dL (ref 8.6–10.4)
Chloride: 103 mmol/L (ref 98–110)
Creat: 0.78 mg/dL (ref 0.60–0.93)
GFR, Est African American: 86 mL/min/{1.73_m2} (ref >=60)
GFR, Est Non African American: 74 mL/min/{1.73_m2} (ref >=60)
Globulin: 2.6 g/dL (calc) (ref 1.9–3.7)
Glucose, Bld: 105 mg/dL — ABNORMAL HIGH (ref 65–99)
Potassium: 4.3 mmol/L (ref 3.5–5.3)
Sodium: 137 mmol/L (ref 135–146)
Total Bilirubin: 0.3 mg/dL (ref 0.2–1.2)
Total Protein: 6.5 g/dL (ref 6.1–8.1)

## 2019-07-05 LAB — TSH: TSH: 0.03 mIU/L — ABNORMAL LOW (ref 0.40–4.50)

## 2019-07-05 LAB — LIPID PANEL
Cholesterol: 210 mg/dL — ABNORMAL HIGH (ref <200)
HDL: 37 mg/dL — ABNORMAL LOW (ref >=50)
LDL Cholesterol (Calc): 132 mg/dL (calc) — ABNORMAL HIGH
Non-HDL Cholesterol (Calc): 173 mg/dL (calc) — ABNORMAL HIGH (ref <130)
Total CHOL/HDL Ratio: 5.7 (calc) — ABNORMAL HIGH (ref <5.0)
Triglycerides: 267 mg/dL — ABNORMAL HIGH (ref <150)

## 2019-07-05 LAB — CBC
HCT: 42 % (ref 35.0–45.0)
Hemoglobin: 14.3 g/dL (ref 11.7–15.5)
MCH: 29.1 pg (ref 27.0–33.0)
MCHC: 34 g/dL (ref 32.0–36.0)
MCV: 85.4 fL (ref 80.0–100.0)
MPV: 10.5 fL (ref 7.5–12.5)
Platelets: 207 10*3/uL (ref 140–400)
RBC: 4.92 10*6/uL (ref 3.80–5.10)
RDW: 13.1 % (ref 11.0–15.0)
WBC: 7.7 10*3/uL (ref 3.8–10.8)

## 2019-07-05 LAB — VITAMIN D 25 HYDROXY (VIT D DEFICIENCY, FRACTURES): Vit D, 25-Hydroxy: 90 ng/mL (ref 30–100)

## 2019-07-05 LAB — T3, FREE: T3, Free: 4.5 pg/mL — ABNORMAL HIGH (ref 2.3–4.2)

## 2019-08-07 ENCOUNTER — Other Ambulatory Visit (INDEPENDENT_AMBULATORY_CARE_PROVIDER_SITE_OTHER): Payer: Self-pay | Admitting: Internal Medicine

## 2019-08-22 ENCOUNTER — Telehealth (INDEPENDENT_AMBULATORY_CARE_PROVIDER_SITE_OTHER): Payer: Self-pay | Admitting: Nurse Practitioner

## 2019-08-22 NOTE — Telephone Encounter (Signed)
That Is the number he gave; his voice and request was soft and broken.

## 2019-08-22 NOTE — Telephone Encounter (Signed)
Okay, hopefully he calls back. If you are able to find another number to [place] I can try that number. Let me know. Thank you.

## 2019-08-22 NOTE — Telephone Encounter (Signed)
I attempted to call back [name] at [facility] at [phone]. Are we sure that is the correct number? The phone rang once and then it seemed to cut off. I attempted twice and same thing happened each time.

## 2019-10-02 ENCOUNTER — Other Ambulatory Visit: Payer: Self-pay

## 2019-10-02 ENCOUNTER — Ambulatory Visit (INDEPENDENT_AMBULATORY_CARE_PROVIDER_SITE_OTHER): Payer: Medicare PPO | Admitting: Internal Medicine

## 2019-10-02 ENCOUNTER — Encounter (INDEPENDENT_AMBULATORY_CARE_PROVIDER_SITE_OTHER): Payer: Self-pay | Admitting: Internal Medicine

## 2019-10-02 VITALS — BP 150/80 | HR 72 | Ht 63.0 in | Wt 176.0 lb

## 2019-10-02 DIAGNOSIS — E559 Vitamin D deficiency, unspecified: Secondary | ICD-10-CM | POA: Diagnosis not present

## 2019-10-02 DIAGNOSIS — E669 Obesity, unspecified: Secondary | ICD-10-CM

## 2019-10-02 DIAGNOSIS — I10 Essential (primary) hypertension: Secondary | ICD-10-CM | POA: Diagnosis not present

## 2019-10-02 DIAGNOSIS — E039 Hypothyroidism, unspecified: Secondary | ICD-10-CM | POA: Diagnosis not present

## 2019-10-02 MED ORDER — THYROID 15 MG PO TABS: ORAL_TABLET | ORAL | 1 refills | Status: DC

## 2019-10-02 MED ORDER — NP THYROID 90 MG PO TABS: ORAL_TABLET | ORAL | 1 refills | Status: DC

## 2019-10-02 NOTE — Progress Notes (Signed)
Metrics: Intervention Frequency ACO  Documented Smoking Status Yearly  Screened one or more times in 24 months  Cessation Counseling or  Active cessation medication Past 24 months  Past 24 months   Guideline developer: UpToDate (See UpToDate for funding source) Date Released: September 26, 2012       Wellness Office Visit  Subjective:  Patient ID: [name], female    DOB: Oct 10, 1940  Age: 79 y.o. MRN: [id number]  CC: This lady comes in for follow-up of her multiple medical problems including hypertension, hypothyroidism, vitamin D deficiency and obesity. HPI  She has done very well since the last time I saw her and is watching her diet very closely.  As a result, she has lost weight.  She has cut out sugars and easy eating more vegetables especially green leafy vegetables such as spinach.  She keeps well-hydrated on the whole. She is compliant with her desiccated NP thyroid and her last T3 level was optimal. She continues with antihypertensive therapy and is tolerating this.  She denies any chest pain, dyspnea, palpitations or limb weakness. Past Medical History:  Diagnosis Date  . Allergy   . Anemia    prior to hysterectomy  . Back pain    comepensating for knee pain per pt  . Cataract    right eye and immature  . Complication of anesthesia   . Constipation    takes Colace daily and Milk of Mag  . CTS (carpal tunnel syndrome) 08/31/2016  . Dizziness    but goes away real quick  . Dry eyes    uses Eye Drops daily as needed  . Female hypogonadism syndrome   . GERD (gastroesophageal reflux disease)   . History of colon polyps   . Hyperlipidemia    was on meds but stopped taking back in June 2015  . Hypertension    takes Lisinopril daily  . Hypothyroidism    takes Synthroid daily  . Joint pain   . Obesity (BMI 30.0-34.9)   . Peripheral arterial disease ([place])   . Pneumonia    as a baby  . PONV (postoperative nausea and vomiting)   . Primary localized osteoarthritis of left knee  04/17/2015  . Primary localized osteoarthritis of right knee    knees  . Urinary frequency   . Urinary urgency       Family History  Problem Relation Age of Onset  . Colon cancer Sister        age 33s  . Myasthenia gravis Sister   . Bladder Cancer Brother   . Lung cancer Brother        age 22  . Alzheimer's disease Mother   . COPD Father   . Asthma Father   . Alcohol abuse Brother        likely died of pneumonia    Social History   Social History Narrative   Husband died after over 82 years and then a year after husband died, she met a man who she had a relationship with for over ten years. He died in September 26, 2017 after long illness.   Live alone.    Has two children, grown sons.    Gets out with neices, enjoys going out to eat, going to [employer].   Goes to church.    Eats all food groups, eats some meat.       Social History   Tobacco Use  . Smoking status: Never Smoker  . Smokeless tobacco: Never Used  Substance Use Topics  .  Alcohol use: No    Current Meds  Medication Sig  . aspirin EC 81 MG tablet Take 81 mg by mouth daily.  . Cholecalciferol (VITAMIN D-3) 125 MCG (5000 UT) TABS Take 10,000 Units by mouth daily at 12 noon.  Marland Kitchen lisinopril (ZESTRIL) 10 MG tablet TAKE ONE (1) TABLET BY MOUTH EVERY DAY  . NP THYROID 90 MG tablet TAKE ONE (1) TABLET BY MOUTH EVERY DAY  . thyroid (NP THYROID) 15 MG tablet TAKE ONE (1) TABLET BY MOUTH EVERY DAY  . [DISCONTINUED] NP THYROID 15 MG tablet TAKE ONE (1) TABLET BY MOUTH EVERY DAY  . [DISCONTINUED] NP THYROID 90 MG tablet TAKE ONE (1) TABLET BY MOUTH EVERY DAY       Objective:   Today's Vitals: BP (!) 150/80   Pulse 72   Ht '5\' 3"'  (1.6 m)   Wt 176 lb (79.8 kg)   LMP 07/20/1990 (Approximate)   BMI 31.18 kg/m  Vitals with BMI 10/02/2019 07/04/2019 03/29/2019  Height '5\' 3"'  '5\' 3"'  '5\' 3"'   Weight 176 lbs 184 lbs 184 lbs  BMI 31.18 11.5 52.0  Systolic 802 233 612  Diastolic 80 69 78  Pulse 72 73 72     Physical  Exam  She looks systemically well.  Systolic blood pressure elevated today but it is usually not as high as this usually so I will ask her to monitor this.  She is alert and orientated without any focal neurological signs.  She has lost 8 pounds since the last time I saw her.     Assessment   1. Vitamin D deficiency   2. Acquired hypothyroidism   3. Essential hypertension   4. Obesity (BMI 30.0-34.9)       Tests ordered No orders of the defined types were placed in this encounter.    Plan: 1. She will continue with vitamin D3 10,000 units daily. 2. She will continue with desiccated NP thyroid at the current dose which seems to be tolerated. 3. She will continue with ACE inhibitor for hypertension. 4. She will continue to work on her weight and diet and hopefully she will lose further weight by the time I see her next time. 5. Follow-up in about 3 months.   Meds ordered this encounter  Medications  . thyroid (NP THYROID) 15 MG tablet    Sig: TAKE ONE (1) TABLET BY MOUTH EVERY DAY    Dispense:  90 tablet    Refill:  1    NEED RX FOR A 90 DAY SUPPLY IF POSSIBLE  THANK YOU  . NP THYROID 90 MG tablet    Sig: TAKE ONE (1) TABLET BY MOUTH EVERY DAY    Dispense:  90 tablet    Refill:  1    PATIENT REQUESTING A 90 DAY SUPPLY WITH REFILLS    Hannah Caffee Luther Parody, MD

## 2019-10-03 ENCOUNTER — Telehealth (INDEPENDENT_AMBULATORY_CARE_PROVIDER_SITE_OTHER): Payer: Self-pay | Admitting: Internal Medicine

## 2019-10-03 ENCOUNTER — Other Ambulatory Visit (INDEPENDENT_AMBULATORY_CARE_PROVIDER_SITE_OTHER): Payer: Self-pay | Admitting: Internal Medicine

## 2019-10-03 MED ORDER — THYROID 15 MG PO TABS: ORAL_TABLET | ORAL | 1 refills | Status: DC

## 2019-10-03 MED ORDER — NP THYROID 90 MG PO TABS: ORAL_TABLET | ORAL | 1 refills | Status: DC

## 2019-10-03 NOTE — Telephone Encounter (Signed)
Okay, let the patient know that I have sent her thyroid medication refills to [facility].

## 2019-10-04 DIAGNOSIS — D1801 Hemangioma of skin and subcutaneous tissue: Secondary | ICD-10-CM | POA: Diagnosis not present

## 2019-10-04 DIAGNOSIS — L821 Other seborrheic keratosis: Secondary | ICD-10-CM | POA: Diagnosis not present

## 2019-10-04 DIAGNOSIS — L570 Actinic keratosis: Secondary | ICD-10-CM | POA: Diagnosis not present

## 2019-10-16 DIAGNOSIS — R21 Rash and other nonspecific skin eruption: Secondary | ICD-10-CM | POA: Diagnosis not present

## 2019-10-16 DIAGNOSIS — L209 Atopic dermatitis, unspecified: Secondary | ICD-10-CM | POA: Diagnosis not present

## 2019-10-27 ENCOUNTER — Other Ambulatory Visit (INDEPENDENT_AMBULATORY_CARE_PROVIDER_SITE_OTHER): Payer: Self-pay | Admitting: Internal Medicine

## 2019-11-27 ENCOUNTER — Other Ambulatory Visit: Payer: Self-pay

## 2019-11-27 ENCOUNTER — Ambulatory Visit (INDEPENDENT_AMBULATORY_CARE_PROVIDER_SITE_OTHER): Payer: Medicare PPO | Admitting: Nurse Practitioner

## 2019-11-27 ENCOUNTER — Encounter (INDEPENDENT_AMBULATORY_CARE_PROVIDER_SITE_OTHER): Payer: Self-pay | Admitting: Nurse Practitioner

## 2019-11-27 VITALS — BP 145/90 | HR 83 | Temp 97.5°F | Ht 63.5 in | Wt 171.0 lb

## 2019-11-27 DIAGNOSIS — R300 Dysuria: Secondary | ICD-10-CM | POA: Diagnosis not present

## 2019-11-27 LAB — POCT URINALYSIS DIPSTICK
Bilirubin, UA: NEGATIVE
Glucose, UA: NEGATIVE
Ketones, UA: NEGATIVE
Leukocytes, UA: NEGATIVE
Nitrite, UA: NEGATIVE
Protein, UA: POSITIVE — AB
Spec Grav, UA: 1.01 (ref 1.010–1.025)
Urobilinogen, UA: 0.2 E.U./dL
pH, UA: 6 (ref 5.0–8.0)

## 2019-11-27 NOTE — Progress Notes (Addendum)
Subjective:  Patient ID: [name], female    DOB: [birth year]  Age: 79 y.o. MRN: [id number]  CC:  Chief Complaint  Patient presents with  . Urinary Tract Infection      HPI  This patient comes in today for an acute visit for the above.  She tells me for the last several weeks she is been having some lower abdominal discomfort and pressure which results in frequency and urgency of urination.  She denies any specific dysuria.  She is concerned that she might have a urinary tract infection.  She does have a significant history for constipation, and tells me that in order to have a bowel movement she almost always has to either administer a suppository or an enema prior to her bowel movement.  She does this every few days if she has not had a bowel movement on her own.  She tells me when she has gone a few days without having a bowel movement her urinary symptoms worsen.  She denies any abdominal pain, nausea, vomiting, or hematuria.  She does report feeling chills.  She does check her temperature regularly at home, and tells me her temperature generally runs around 79 F but has noted up to 66 F in the recent past.    Past Medical History:  Diagnosis Date  . Allergy   . Anemia    prior to hysterectomy  . Back pain    comepensating for knee pain per pt  . Cataract    right eye and immature  . Complication of anesthesia   . Constipation    takes Colace daily and Milk of Mag  . CTS (carpal tunnel syndrome) 08/31/2016  . Dizziness    but goes away real quick  . Dry eyes    uses Eye Drops daily as needed  . Female hypogonadism syndrome   . GERD (gastroesophageal reflux disease)   . History of colon polyps   . Hyperlipidemia    was on meds but stopped taking back in June 2015  . Hypertension    takes Lisinopril daily  . Hypothyroidism    takes Synthroid daily  . Joint pain   . Obesity (BMI 30.0-34.9)   . Peripheral arterial disease ([place])   . Pneumonia    as a baby    . PONV (postoperative nausea and vomiting)   . Primary localized osteoarthritis of left knee 04/17/2015  . Primary localized osteoarthritis of right knee    knees  . Urinary frequency   . Urinary urgency       Family History  Problem Relation Age of Onset  . Colon cancer Sister        age 87s  . Myasthenia gravis Sister   . Bladder Cancer Brother   . Lung cancer Brother        age 57  . Alzheimer's disease Mother   . COPD Father   . Asthma Father   . Alcohol abuse Brother        likely died of pneumonia    Social History   Social History Narrative   Husband died after over 48 years and then a year after husband died, she met a man who she had a relationship with for over ten years. He died in 10/03/2017 after long illness.   Live alone.    Has two children, grown sons.    Gets out with neices, enjoys going out to eat, going to [employer].   Goes  to church.    Eats all food groups, eats some meat.       Social History   Tobacco Use  . Smoking status: Never Smoker  . Smokeless tobacco: Never Used  Substance Use Topics  . Alcohol use: No     Current Meds  Medication Sig  . aspirin EC 81 MG tablet Take 81 mg by mouth daily.  . Cholecalciferol (VITAMIN D-3) 125 MCG (5000 UT) TABS Take 10,000 Units by mouth daily at 12 noon.  Marland Kitchen lisinopril (ZESTRIL) 10 MG tablet TAKE ONE (1) TABLET BY MOUTH EVERY DAY  . NP THYROID 90 MG tablet TAKE ONE (1) TABLET BY MOUTH EVERY DAY  . polyethylene glycol (MIRALAX / GLYCOLAX) 17 g packet Take 17 g by mouth daily as needed.  . sodium phosphate Pediatric (FLEET) 3.5-9.5 GM/59ML enema Place 1 enema rectally once as needed for severe constipation.  . Suppository Base PLLT 1 suppository by Does not apply route. Every few days as needed for constipation  . thyroid (NP THYROID) 15 MG tablet TAKE ONE (1) TABLET BY MOUTH EVERY DAY    ROS:  Review of Systems  Constitutional: Positive for chills. Negative for fever.  Gastrointestinal:  Negative for abdominal pain, nausea and vomiting.       (+) abdominal pressure; intermittently especially when she does a few days without having a bowel movement  Genitourinary: Positive for frequency and urgency. Negative for dysuria and hematuria.     Objective:   Today's Vitals: BP (!) 145/90 (BP Location: Left Arm, Patient Position: Sitting, Cuff Size: Normal)   Pulse 83   Temp (!) 97.5 F (36.4 C) (Temporal)   Ht 5' 3.5" (1.613 m)   Wt 171 lb (77.6 kg)   LMP 07/20/1990 (Approximate)   SpO2 94%   BMI 29.82 kg/m  Vitals with BMI 11/27/2019 10/02/2019 07/04/2019  Height 5' 3.5" '5\' 3"'  '5\' 3"'   Weight 171 lbs 176 lbs 184 lbs  BMI 29.81 57.01 77.9  Systolic 390 300 923  Diastolic 90 80 69  Pulse 83 72 73     Physical Exam Vitals reviewed.  Constitutional:      General: She is not in acute distress.    Appearance: Normal appearance.  HENT:     Head: Normocephalic and atraumatic.  Neck:     Vascular: No carotid bruit.  Cardiovascular:     Rate and Rhythm: Normal rate and regular rhythm.     Pulses: Normal pulses.     Heart sounds: Normal heart sounds.  Pulmonary:     Effort: Pulmonary effort is normal.     Breath sounds: Normal breath sounds.  Abdominal:     Tenderness: There is no right CVA tenderness or left CVA tenderness.  Skin:    General: Skin is warm and dry.  Neurological:     General: No focal deficit present.     Mental Status: She is alert and oriented to person, place, and time.  Psychiatric:        Mood and Affect: Mood normal.        Behavior: Behavior normal.        Judgment: Judgment normal.          Assessment and Plan   1. Dysuria      Plan: 1.  I am going to send her urine off for UA in reflex to culture.  We did do a point-of-care urinalysis in the office which did not show leukocytes, nitrates but it did show some  blood.  The patient did tell me she has a family member that has a history of bladder cancer.  If culture comes back  negative we will need to do further investigation of her symptoms.  I did explain this to the patient and told her that she should wait for [place] to call her regarding next steps.  As of right now I am not going to prescribe her antibiotic as her symptoms are fairly mild and have been ongoing for a couple of weeks.  Of course I will prescribe antibiotic later this week once culture results have returned if necessary.  She tells me she understands.  I also told her that if her symptoms worsen or progress over the next few days, that she should let me know and if he still having on culture results of that time I will send an antibiotic.  She tells me she understands.  I did encourage her to use Pyridium over-the-counter as needed, she tells me she will consider this.   Tests ordered Orders Placed This Encounter  Procedures  . Urinalysis with Culture Reflex  . POC Urinalysis Dipstick      No orders of the defined types were placed in this encounter.   Patient to follow-up as scheduled next month or sooner pending results of urine testing.  Ailene Ards, NP

## 2019-11-28 LAB — URINALYSIS W MICROSCOPIC + REFLEX CULTURE
Bacteria, UA: NONE SEEN /HPF
Bilirubin Urine: NEGATIVE
Glucose, UA: NEGATIVE
Hgb urine dipstick: NEGATIVE
Hyaline Cast: NONE SEEN /LPF
Ketones, ur: NEGATIVE
Leukocyte Esterase: NEGATIVE
Nitrites, Initial: NEGATIVE
Protein, ur: NEGATIVE
RBC / HPF: NONE SEEN /HPF (ref 0–2)
Specific Gravity, Urine: 1.007 (ref 1.001–1.03)
WBC, UA: NONE SEEN /HPF (ref 0–5)
pH: 7 (ref 5.0–8.0)

## 2019-11-28 LAB — NO CULTURE INDICATED

## 2019-12-22 DIAGNOSIS — H01002 Unspecified blepharitis right lower eyelid: Secondary | ICD-10-CM | POA: Diagnosis not present

## 2020-01-02 ENCOUNTER — Ambulatory Visit (INDEPENDENT_AMBULATORY_CARE_PROVIDER_SITE_OTHER): Payer: Medicare PPO | Admitting: Internal Medicine

## 2020-01-04 ENCOUNTER — Other Ambulatory Visit (INDEPENDENT_AMBULATORY_CARE_PROVIDER_SITE_OTHER): Payer: Self-pay | Admitting: Nurse Practitioner

## 2020-01-04 ENCOUNTER — Encounter: Payer: Self-pay | Admitting: Nurse Practitioner

## 2020-01-04 ENCOUNTER — Ambulatory Visit (INDEPENDENT_AMBULATORY_CARE_PROVIDER_SITE_OTHER): Payer: Medicare PPO | Admitting: Nurse Practitioner

## 2020-01-04 ENCOUNTER — Other Ambulatory Visit: Payer: Self-pay

## 2020-01-04 ENCOUNTER — Encounter (INDEPENDENT_AMBULATORY_CARE_PROVIDER_SITE_OTHER): Payer: Self-pay | Admitting: Nurse Practitioner

## 2020-01-04 ENCOUNTER — Telehealth (INDEPENDENT_AMBULATORY_CARE_PROVIDER_SITE_OTHER): Payer: Self-pay | Admitting: Nurse Practitioner

## 2020-01-04 VITALS — BP 140/80 | HR 63 | Temp 97.3°F | Ht 63.5 in | Wt 169.4 lb

## 2020-01-04 DIAGNOSIS — R350 Frequency of micturition: Secondary | ICD-10-CM | POA: Diagnosis not present

## 2020-01-04 DIAGNOSIS — I10 Essential (primary) hypertension: Secondary | ICD-10-CM

## 2020-01-04 DIAGNOSIS — E782 Mixed hyperlipidemia: Secondary | ICD-10-CM

## 2020-01-04 DIAGNOSIS — E559 Vitamin D deficiency, unspecified: Secondary | ICD-10-CM | POA: Diagnosis not present

## 2020-01-04 DIAGNOSIS — E039 Hypothyroidism, unspecified: Secondary | ICD-10-CM

## 2020-01-04 DIAGNOSIS — R42 Dizziness and giddiness: Secondary | ICD-10-CM

## 2020-01-04 MED ORDER — LISINOPRIL 5 MG PO TABS: 5.0000 mg | ORAL_TABLET | Freq: Every day | ORAL | 1 refills | Status: DC

## 2020-01-04 NOTE — Progress Notes (Addendum)
Subjective:  Patient ID: [name], female    DOB: 1941/06/28  Age: 79 y.o. MRN: [id number]  CC:  Chief Complaint  Patient presents with  . Urinary Frequency  . vitamin d deficiency  . follow up lab work  . Hyperlipidemia  . Hypertension  . Hypothyroidism  . Other    Dizziness      HPI  This patient arrives today for the above.  Urinary frequency: She is been experiencing some urinary frequency.  At last office visit we did do urine dipstick which did show hematuria, but on repeat UA no hematuria was noted.  She continues to deny any dysuria or flank pain.  Vitamin D deficiency: She continues on vitamin D supplement, she is due for serum check.  Hyperlipidemia: Last lipid panel was collected in December.  She continues on aspirin, but is not on any statin, omega-3 supplement, or fenofibrate. Lipid Panel         Component                Value               Date/Time                 CHOL                     210 (H)             07/04/2019 1332           TRIG                     267 (H)             07/04/2019 1332           HDL                      37 (L)              07/04/2019 1332           CHOLHDL                  5.7 (H)             07/04/2019 1332           VLDL                     58 (H)              12/28/2016 0914           LDLCALC                  132 (H)             07/04/2019 1332         Hypertension: She is a history of hypertension and continues on her lisinopril daily.  She does check her blood pressure at home, and systolic blood pressures have been running in the 120s to 063K and diastolic has been running generally in the 80s.  Hypothyroidism: She continues on her desiccated thyroid to treat her hypothyroidism.  Dizziness: She is been experiencing dizziness from laying to sitting for the past few months.  She denies any syncope but has felt close to passing out intermittently.  She has not fallen.  She does not note any new changes to her vision.   She has not been having  any chest pain, fatigue, shortness of breath, palpitations.  She does mention some mild headaches.  She admits to not drinking much water during the day.   Past Medical History:  Diagnosis Date  . Allergy   . Anemia    prior to hysterectomy  . Back pain    comepensating for knee pain per pt  . Cataract    right eye and immature  . Complication of anesthesia   . Constipation    takes Colace daily and Milk of Mag  . CTS (carpal tunnel syndrome) 08/31/2016  . Dizziness    but goes away real quick  . Dry eyes    uses Eye Drops daily as needed  . Female hypogonadism syndrome   . GERD (gastroesophageal reflux disease)   . History of colon polyps   . Hyperlipidemia    was on meds but stopped taking back in June 2015  . Hypertension    takes Lisinopril daily  . Hypothyroidism    takes Synthroid daily  . Joint pain   . Obesity (BMI 30.0-34.9)   . Peripheral arterial disease ([place])   . Pneumonia    as a baby  . PONV (postoperative nausea and vomiting)   . Primary localized osteoarthritis of left knee 04/17/2015  . Primary localized osteoarthritis of right knee    knees  . Urinary frequency   . Urinary urgency       Family History  Problem Relation Age of Onset  . Colon cancer Sister        age 37s  . Myasthenia gravis Sister   . Bladder Cancer Brother   . Lung cancer Brother        age 80  . Alzheimer's disease Mother   . COPD Father   . Asthma Father   . Alcohol abuse Brother        likely died of pneumonia    Social History   Social History Narrative   Husband died after over 60 years and then a year after husband died, she met a man who she had a relationship with for over ten years. He died in 09/17/2017 after long illness.   Live alone.    Has two children, grown sons.    Gets out with neices, enjoys going out to eat, going to [employer].   Goes to church.    Eats all food groups, eats some meat.       Social History   Tobacco Use    . Smoking status: Never Smoker  . Smokeless tobacco: Never Used  Substance Use Topics  . Alcohol use: No     Current Meds  Medication Sig  . aspirin EC 81 MG tablet Take 81 mg by mouth daily.  . Cholecalciferol (VITAMIN D-3) 125 MCG (5000 UT) TABS Take 5,000 Units by mouth daily at 12 noon.   Marland Kitchen lisinopril (ZESTRIL) 10 MG tablet TAKE ONE (1) TABLET BY MOUTH EVERY DAY  . NP THYROID 90 MG tablet TAKE ONE (1) TABLET BY MOUTH EVERY DAY  . sodium phosphate Pediatric (FLEET) 3.5-9.5 GM/59ML enema Place 1 enema rectally once as needed for severe constipation.  . Suppository Base PLLT 1 suppository by Does not apply route. Every few days as needed for constipation  . thyroid (NP THYROID) 15 MG tablet TAKE ONE (1) TABLET BY MOUTH EVERY DAY    ROS:  Review of Systems  Constitutional: Positive for weight loss (intentional). Negative for fever and malaise/fatigue.  Eyes: Negative for blurred  vision and double vision.  Respiratory: Negative for sputum production.   Cardiovascular: Negative for chest pain and palpitations.  Genitourinary: Positive for frequency. Negative for dysuria, flank pain and hematuria.  Neurological: Positive for headaches. Negative for dizziness.     Objective:   Today's Vitals: BP 140/80 (BP Location: Left Arm, Patient Position: Sitting, Cuff Size: Normal)   Pulse 63   Temp (!) 97.3 F (36.3 C) (Temporal)   Ht 5' 3.5" (1.613 m)   Wt 169 lb 6.4 oz (76.8 kg)   LMP 07/20/1990 (Approximate)   SpO2 96%   BMI 29.54 kg/m  Vitals with BMI 01/04/2020 11/27/2019 10/02/2019  Height 5' 3.5" 5' 3.5" 5' 3"  Weight 169 lbs 6 oz 171 lbs 176 lbs  BMI 29.53 41.96 22.29  Systolic 798 921 194  Diastolic 80 90 80  Pulse 63 83 72     Physical Exam Vitals reviewed.  Constitutional:      General: She is not in acute distress.    Appearance: Normal appearance.  HENT:     Head: Normocephalic and atraumatic.     Right Ear: Hearing normal. There is impacted cerumen.     Left  Ear: Hearing normal. There is impacted cerumen.  Neck:     Vascular: No carotid bruit.  Cardiovascular:     Rate and Rhythm: Normal rate and regular rhythm.     Pulses: Normal pulses.     Heart sounds: Normal heart sounds.  Pulmonary:     Effort: Pulmonary effort is normal.     Breath sounds: Normal breath sounds.  Skin:    General: Skin is warm and dry.  Neurological:     General: No focal deficit present.     Mental Status: She is alert and oriented to person, place, and time.     Cranial Nerves: Cranial nerves are intact.     Sensory: Sensation is intact.     Motor: Motor function is intact.     Coordination: Coordination is intact.     Gait: Gait is intact.     Deep Tendon Reflexes:     Reflex Scores:      Patellar reflexes are 1+ on the right side and 1+ on the left side. Psychiatric:        Mood and Affect: Mood normal.        Behavior: Behavior normal.        Judgment: Judgment normal.      EKG interpretation: Sinus rhythm with Q waves in V1 V2, inverted T wave V1 and V2.    Assessment and Plan   1. Urinary frequency   2. Vitamin D deficiency   3. Hypothyroidism, unspecified type   4. Essential hypertension   5. Mixed hyperlipidemia   6. Dizziness      Plan: 1.  The seems to have improved drastically.  Would still like to recheck urine to ensure there is no microscopic hematuria.  We will have urine collected when blood work is collected next week.  2.  She will continue on her vitamin D supplement, and I will recheck serum level we do blood work next week.  3.  She will continue on her current medications, and we will check blood work next week.  5.  We will check fasting lipid panel next week we do her blood work.  4, 6.  EKG compared with EKG from June 2019.  Mostly it looks the same except that in 2019 V2 did not have a Q  wave and today there is a Q-wave in lead V2.  Both T waves are in the same direction as the QRS in V1 and V2.  Normal rhythm and  rate.  She is not experiencing any fatigue or shortness of breath.  She does not experience any chest pain or palpitations.  She did not have orthostatic hypotension, but her blood pressure in the office today was quite elevated.  Her at-home readings are much better.  She does not seem to feel especially nervous in the office, however she did start to get a bit anxious when she was undergoing orthostatic vital signs as well as having the EKG completed.  My differentials include mild dehydration, hypertension, coronary artery disease, thyrotoxicosis, anxiety, BPPV, and space-occupying lesion in her brain.  To start, I asked that she bring her blood pressure cuff with her to the office next week so that we can validate whether it is accurate for her at home readings and to focus on drinking 8 eight ounce glasses of water every day.  Will order cardiac echocardiogram for further evaluation.  I will also check blood work next week when she comes to the office as she is not fasting today, based upon those results, impact of focusing on hydration on her symptoms, and determining if her at home blood pressure readings are accurate, further recommendations will be made.  May need to consider referral to cardiology or imaging of her brain.   Tests ordered Orders Placed This Encounter  Procedures  . CBC  . CMP with eGFR([facility])  . Lipid Panel  . Vitamin D, 25-hydroxy  . Urinalysis with Reflex Microscopic  . Urinalysis with Culture Reflex  . TSH  . T3, Free  . T4, Free      No orders of the defined types were placed in this encounter.   Patient to follow-up in 1 week. I spent >40 minutes dedicated to the care of this patient on the date of this encounter which includes a combination of either face-to-face or virtual contact with the patient, review of records, and ordering of tests and/or procedures.   Ailene Ards, NP

## 2020-01-04 NOTE — Telephone Encounter (Signed)
Patient is aware of the echo and the increase of blood pressure medication

## 2020-01-04 NOTE — Telephone Encounter (Signed)
Please call patient and let her know that in addition to doing blood work next week I am going to send her for a cardiac echocardiogram.  This is an ultrasound of her heart, this is just for further evaluation of her dizziness.  [facility] radiology should be calling her within the next couple weeks to get this scheduled.  Thank you.

## 2020-01-04 NOTE — Progress Notes (Signed)
Patient called and stated she was checking her BP at home and it ran 172/91, 172/95, 159/88, 164/94, 159/78, 155/87, 141/73. Will increase her lisinopril from 10mg  daily to 15mg  daily. She will f/u as scheduled next week. She was told to monitor her BP at home and to call the office if she feels unwell or if her BP falls below 100/70.

## 2020-01-04 NOTE — Addendum Note (Signed)
Addended by: Jeralyn Ruths E on: 01/04/2020 12:52 PM   Modules accepted: Orders

## 2020-01-10 ENCOUNTER — Encounter (INDEPENDENT_AMBULATORY_CARE_PROVIDER_SITE_OTHER): Payer: Self-pay | Admitting: Nurse Practitioner

## 2020-01-10 ENCOUNTER — Other Ambulatory Visit: Payer: Self-pay

## 2020-01-10 ENCOUNTER — Ambulatory Visit (INDEPENDENT_AMBULATORY_CARE_PROVIDER_SITE_OTHER): Payer: Medicare PPO | Admitting: Nurse Practitioner

## 2020-01-10 VITALS — BP 140/80 | HR 64 | Temp 96.9°F | Ht 63.5 in | Wt 167.0 lb

## 2020-01-10 DIAGNOSIS — E039 Hypothyroidism, unspecified: Secondary | ICD-10-CM | POA: Diagnosis not present

## 2020-01-10 DIAGNOSIS — E782 Mixed hyperlipidemia: Secondary | ICD-10-CM | POA: Diagnosis not present

## 2020-01-10 DIAGNOSIS — I10 Essential (primary) hypertension: Secondary | ICD-10-CM | POA: Diagnosis not present

## 2020-01-10 DIAGNOSIS — R42 Dizziness and giddiness: Secondary | ICD-10-CM

## 2020-01-10 DIAGNOSIS — R350 Frequency of micturition: Secondary | ICD-10-CM | POA: Diagnosis not present

## 2020-01-10 DIAGNOSIS — E559 Vitamin D deficiency, unspecified: Secondary | ICD-10-CM | POA: Diagnosis not present

## 2020-01-10 NOTE — Progress Notes (Signed)
Subjective:  Patient ID: [name], female    DOB: Jul 08, 1941  Age: 79 y.o. MRN: [id number]  CC:  Chief Complaint  Patient presents with   Hypertension   Follow-up    Dizziness      HPI  This patient comes in today for the above.  Our last office visit she was told me she was having some experiences of feeling dizzy and lightheaded intermittently.  She was found to be quite hypertensive at that time.  We increased her lisinopril from 10 mg daily to 15 mg daily.  She tells me that she has been feeling much better with this medication increase.  She continues to monitor her blood pressure at home, and brings in her at home blood pressure cuff today.  Her at home blood pressure this morning was 137/80 and blood pressure taken today by my staff was 140/80.  She is due to have lab work collected today.   Past Medical History:  Diagnosis Date   Allergy    Anemia    prior to hysterectomy   Back pain    comepensating for knee pain per pt   Cataract    right eye and immature   Complication of anesthesia    Constipation    takes Colace daily and Milk of Mag   CTS (carpal tunnel syndrome) September 22, 2016   Dizziness    but goes away real quick   Dry eyes    uses Eye Drops daily as needed   Female hypogonadism syndrome    GERD (gastroesophageal reflux disease)    History of colon polyps    Hyperlipidemia    was on meds but stopped taking back in June 2015   Hypertension    takes Lisinopril daily   Hypothyroidism    takes Synthroid daily   Joint pain    Obesity (BMI 30.0-34.9)    Peripheral arterial disease ([facility])    Pneumonia    as a baby   PONV (postoperative nausea and vomiting)    Primary localized osteoarthritis of left knee 04/17/2015   Primary localized osteoarthritis of right knee    knees   Urinary frequency    Urinary urgency       Family History  Problem Relation Age of Onset   Colon cancer Sister        age 48s    Myasthenia gravis Sister    Bladder Cancer Brother    Lung cancer Brother        age 65   Alzheimer's disease Mother    COPD Father    Asthma Father    Alcohol abuse Brother        likely died of pneumonia    Social History   Social History Narrative   Husband died after over 4 years and then a year after husband died, she met a man who she had a relationship with for over ten years. He died in Sep 22, 2017 after long illness.   Live alone.    Has two children, grown sons.    Gets out with neices, enjoys going out to eat, going to [employer].   Goes to church.    Eats all food groups, eats some meat.       Social History   Tobacco Use   Smoking status: Never Smoker   Smokeless tobacco: Never Used  Substance Use Topics   Alcohol use: No     Current Meds  Medication Sig   aspirin EC  81 MG tablet Take 81 mg by mouth daily.   Cholecalciferol (VITAMIN D-3) 125 MCG (5000 UT) TABS Take 5,000 Units by mouth daily at 12 noon.    lisinopril (ZESTRIL) 10 MG tablet TAKE ONE (1) TABLET BY MOUTH EVERY DAY   lisinopril (ZESTRIL) 5 MG tablet Take 1 tablet (5 mg total) by mouth daily.   NP THYROID 90 MG tablet TAKE ONE (1) TABLET BY MOUTH EVERY DAY   sodium phosphate Pediatric (FLEET) 3.5-9.5 GM/59ML enema Place 1 enema rectally once as needed for severe constipation.   Suppository Base PLLT 1 suppository by Does not apply route. Every few days as needed for constipation   thyroid (NP THYROID) 15 MG tablet TAKE ONE (1) TABLET BY MOUTH EVERY DAY    ROS:  Review of Systems  Respiratory: Negative.   Cardiovascular: Negative.   Neurological: Negative.      Objective:   Today's Vitals: BP 140/80 (BP Location: Left Arm, Patient Position: Sitting, Cuff Size: Normal)    Pulse 64    Temp (!) 96.9 F (36.1 C) (Temporal)    Ht 5' 3.5" (1.613 m)    Wt 167 lb (75.8 kg)    LMP 07/20/1990 (Approximate)    SpO2 96%    BMI 29.12 kg/m  Vitals with BMI 01/10/2020 01/04/2020 11/27/2019   Height 5' 3.5" 5' 3.5" 5' 3.5"  Weight 167 lbs 169 lbs 6 oz 171 lbs  BMI 29.12 32.95 18.84  Systolic 166 063 016  Diastolic 80 80 90  Pulse 64 63 83     Physical Exam Vitals reviewed.  Constitutional:      General: She is not in acute distress.    Appearance: Normal appearance.  HENT:     Head: Normocephalic and atraumatic.  Neck:     Vascular: No carotid bruit.  Cardiovascular:     Rate and Rhythm: Normal rate and regular rhythm.     Pulses: Normal pulses.     Heart sounds: Normal heart sounds.  Pulmonary:     Effort: Pulmonary effort is normal.     Breath sounds: Normal breath sounds.  Skin:    General: Skin is warm and dry.  Neurological:     General: No focal deficit present.     Mental Status: She is alert and oriented to person, place, and time.  Psychiatric:        Mood and Affect: Mood normal.        Behavior: Behavior normal.        Judgment: Judgment normal.          Assessment and Plan   1. Dizziness   2. Essential hypertension      Plan: 1., 2.  The symptoms seem to be improving with blood pressure being better controlled on current regimen of lisinopril 15 mg daily.  She will continue take these medications as prescribed.  She will proceed to the lab today to have thyroid levels, vitamin D level, metabolic panel, CBC, lipid panel, and UA completed today.  Further recommendations may be made based upon these results.  She will also follow-up as scheduled to have cardiac echocardiogram completed to rule out any structural abnormalities of her heart that could be contributing to this symptom.    Tests ordered No orders of the defined types were placed in this encounter.     No orders of the defined types were placed in this encounter.   Patient to follow-up in 2 months for close follow-up, or sooner as  needed.  Ailene Ards, NP

## 2020-01-11 ENCOUNTER — Other Ambulatory Visit (INDEPENDENT_AMBULATORY_CARE_PROVIDER_SITE_OTHER): Payer: Self-pay | Admitting: Nurse Practitioner

## 2020-01-11 DIAGNOSIS — E039 Hypothyroidism, unspecified: Secondary | ICD-10-CM

## 2020-01-11 LAB — COMPLETE METABOLIC PANEL WITH GFR
AG Ratio: 1.6 (calc) (ref 1.0–2.5)
ALT: 11 U/L (ref 6–29)
AST: 15 U/L (ref 10–35)
Albumin: 4.1 g/dL (ref 3.6–5.1)
Alkaline phosphatase (APISO): 74 U/L (ref 37–153)
BUN: 18 mg/dL (ref 7–25)
CO2: 29 mmol/L (ref 20–32)
Calcium: 9.6 mg/dL (ref 8.6–10.4)
Chloride: 103 mmol/L (ref 98–110)
Creat: 0.88 mg/dL (ref 0.60–0.93)
GFR, Est African American: 74 mL/min/{1.73_m2} (ref >=60)
GFR, Est Non African American: 64 mL/min/{1.73_m2} (ref >=60)
Globulin: 2.5 g/dL (calc) (ref 1.9–3.7)
Glucose, Bld: 103 mg/dL — ABNORMAL HIGH (ref 65–99)
Potassium: 5 mmol/L (ref 3.5–5.3)
Sodium: 139 mmol/L (ref 135–146)
Total Bilirubin: 0.4 mg/dL (ref 0.2–1.2)
Total Protein: 6.6 g/dL (ref 6.1–8.1)

## 2020-01-11 LAB — LIPID PANEL
Cholesterol: 181 mg/dL (ref <200)
HDL: 45 mg/dL — ABNORMAL LOW (ref >=50)
LDL Cholesterol (Calc): 111 mg/dL (calc) — ABNORMAL HIGH
Non-HDL Cholesterol (Calc): 136 mg/dL (calc) — ABNORMAL HIGH (ref <130)
Total CHOL/HDL Ratio: 4 (calc) (ref <5.0)
Triglycerides: 141 mg/dL (ref <150)

## 2020-01-11 LAB — T3, FREE: T3, Free: 6.3 pg/mL — ABNORMAL HIGH (ref 2.3–4.2)

## 2020-01-11 LAB — CBC
HCT: 41.9 % (ref 35.0–45.0)
Hemoglobin: 13.9 g/dL (ref 11.7–15.5)
MCH: 28.8 pg (ref 27.0–33.0)
MCHC: 33.2 g/dL (ref 32.0–36.0)
MCV: 86.9 fL (ref 80.0–100.0)
MPV: 10.6 fL (ref 7.5–12.5)
Platelets: 208 10*3/uL (ref 140–400)
RBC: 4.82 10*6/uL (ref 3.80–5.10)
RDW: 13 % (ref 11.0–15.0)
WBC: 6.7 10*3/uL (ref 3.8–10.8)

## 2020-01-11 LAB — URINALYSIS W MICROSCOPIC + REFLEX CULTURE
Bacteria, UA: NONE SEEN /HPF
Bilirubin Urine: NEGATIVE
Glucose, UA: NEGATIVE
Hgb urine dipstick: NEGATIVE
Hyaline Cast: NONE SEEN /LPF
Ketones, ur: NEGATIVE
Leukocyte Esterase: NEGATIVE
Nitrites, Initial: NEGATIVE
Protein, ur: NEGATIVE
RBC / HPF: NONE SEEN /HPF (ref 0–2)
Specific Gravity, Urine: 1.007 (ref 1.001–1.03)
WBC, UA: NONE SEEN /HPF (ref 0–5)
pH: 6 (ref 5.0–8.0)

## 2020-01-11 LAB — NO CULTURE INDICATED

## 2020-01-11 LAB — VITAMIN D 25 HYDROXY (VIT D DEFICIENCY, FRACTURES): Vit D, 25-Hydroxy: 91 ng/mL (ref 30–100)

## 2020-01-11 LAB — TSH: TSH: 0.01 mIU/L — ABNORMAL LOW (ref 0.40–4.50)

## 2020-01-11 LAB — T4, FREE: Free T4: 1.1 ng/dL (ref 0.8–1.8)

## 2020-01-11 MED ORDER — THYROID 60 MG PO TABS: 60.0000 mg | ORAL_TABLET | Freq: Every day | ORAL | 0 refills | Status: DC

## 2020-01-11 NOTE — Progress Notes (Signed)
Reducing patient's dose of her np thyroid due to complaints of dizziness in the office and elevated T3 level.

## 2020-01-17 DIAGNOSIS — H25812 Combined forms of age-related cataract, left eye: Secondary | ICD-10-CM | POA: Diagnosis not present

## 2020-01-19 ENCOUNTER — Ambulatory Visit (HOSPITAL_COMMUNITY)
Admission: RE | Admit: 2020-01-19 | Discharge: 2020-01-19 | Disposition: A | Discharge status: Home / Self Care | Payer: Medicare PPO | Source: Ambulatory Visit | Attending: Nurse Practitioner | Admitting: Nurse Practitioner

## 2020-01-19 ENCOUNTER — Other Ambulatory Visit: Payer: Self-pay

## 2020-01-19 ENCOUNTER — Other Ambulatory Visit (INDEPENDENT_AMBULATORY_CARE_PROVIDER_SITE_OTHER): Payer: Self-pay | Admitting: Nurse Practitioner

## 2020-01-19 DIAGNOSIS — R42 Dizziness and giddiness: Secondary | ICD-10-CM

## 2020-01-19 DIAGNOSIS — I340 Nonrheumatic mitral (valve) insufficiency: Secondary | ICD-10-CM | POA: Insufficient documentation

## 2020-01-19 DIAGNOSIS — I10 Essential (primary) hypertension: Secondary | ICD-10-CM | POA: Insufficient documentation

## 2020-01-19 DIAGNOSIS — E785 Hyperlipidemia, unspecified: Secondary | ICD-10-CM | POA: Insufficient documentation

## 2020-01-19 NOTE — Progress Notes (Signed)
*  PRELIMINARY RESULTS* Echocardiogram 2D Echocardiogram has been performed.  Hannah, [name] 01/19/2020, 1:55 PM

## 2020-01-29 ENCOUNTER — Telehealth (INDEPENDENT_AMBULATORY_CARE_PROVIDER_SITE_OTHER): Payer: Self-pay

## 2020-01-29 NOTE — Telephone Encounter (Signed)
I would recommend that she continue with the NP thyroid 60 mg tablet only at this point and not to worry about the NP thyroid 15 mg.  I would rather not switch her to levothyroxine but if she wants me to switch her to levothyroxine, she has to understand that that is not going to be as optimal as keeping with the NP thyroid.  Let me know what she would like to do.

## 2020-01-29 NOTE — Telephone Encounter (Signed)
[name] agrees to just stay with the NP Thyroid 60mg  at this time

## 2020-01-29 NOTE — Telephone Encounter (Signed)
[name] is calling stating that she is taking the combination of the [place] Thyroid 60 and 15 and she states that she can no longer get the 15 mg at the pharmacy anymore and she is asking if she could please go back to the Levothyroxine instead, please advise, she is currently out of the 15 mg dose

## 2020-02-05 ENCOUNTER — Other Ambulatory Visit (INDEPENDENT_AMBULATORY_CARE_PROVIDER_SITE_OTHER): Payer: Self-pay | Admitting: Nurse Practitioner

## 2020-03-04 ENCOUNTER — Other Ambulatory Visit (INDEPENDENT_AMBULATORY_CARE_PROVIDER_SITE_OTHER): Payer: Self-pay | Admitting: Nurse Practitioner

## 2020-03-04 DIAGNOSIS — I10 Essential (primary) hypertension: Secondary | ICD-10-CM

## 2020-03-19 ENCOUNTER — Ambulatory Visit (INDEPENDENT_AMBULATORY_CARE_PROVIDER_SITE_OTHER): Payer: Medicare PPO | Admitting: Nurse Practitioner

## 2020-04-03 ENCOUNTER — Encounter (INDEPENDENT_AMBULATORY_CARE_PROVIDER_SITE_OTHER): Payer: Self-pay | Admitting: Nurse Practitioner

## 2020-04-03 ENCOUNTER — Other Ambulatory Visit: Payer: Self-pay

## 2020-04-03 ENCOUNTER — Ambulatory Visit (INDEPENDENT_AMBULATORY_CARE_PROVIDER_SITE_OTHER): Payer: Medicare PPO | Admitting: Nurse Practitioner

## 2020-04-03 VITALS — BP 122/74 | HR 62 | Temp 97.3°F | Resp 16 | Ht 63.0 in | Wt 163.0 lb

## 2020-04-03 DIAGNOSIS — R252 Cramp and spasm: Secondary | ICD-10-CM | POA: Diagnosis not present

## 2020-04-03 DIAGNOSIS — E039 Hypothyroidism, unspecified: Secondary | ICD-10-CM

## 2020-04-03 NOTE — Progress Notes (Signed)
Subjective:  Patient ID: [name], female    DOB: [birth year]  Age: 79 y.o. MRN: [id number]  CC:  Chief Complaint  Patient presents with  . Hypothyroidism  . Other    Leg cramps      HPI  This patient comes in today for the above.  She was having some dizziness at a previous visit and part of her work-up showed that her free T3 levels were a bit elevated.  Subsequently her NP thyroid dose was reduced to 60 mg daily.  Since reducing her dose she tells me she is feeling a bit better and has not been experiencing any dizziness except for when she changes positions quickly.  She is experiencing some leg cramps intermittently at night, but she tells me they are not very severe.  She used to take a vitamin B12 supplement but tells me she recently stopped it.  She is also been trying to lose weight and has been following a calorie restrictive diet and tells me that at home she is weighing approximately 160 pounds which is a weight loss of around 25 pounds since she started try to lose weight.  Past Medical History:  Diagnosis Date  . Allergy   . Anemia    prior to hysterectomy  . Back pain    comepensating for knee pain per pt  . Cataract    right eye and immature  . Complication of anesthesia   . Constipation    takes Colace daily and Milk of Mag  . CTS (carpal tunnel syndrome) 09/10/2016  . Dizziness    but goes away real quick  . Dry eyes    uses Eye Drops daily as needed  . Female hypogonadism syndrome   . GERD (gastroesophageal reflux disease)   . History of colon polyps   . Hyperlipidemia    was on meds but stopped taking back in June 2015  . Hypertension    takes Lisinopril daily  . Hypothyroidism    takes Synthroid daily  . Joint pain   . Obesity (BMI 30.0-34.9)   . Peripheral arterial disease ([place])   . Pneumonia    as a baby  . PONV (postoperative nausea and vomiting)   . Primary localized osteoarthritis of left knee 04/17/2015  . Primary localized  osteoarthritis of right knee    knees  . Urinary frequency   . Urinary urgency       Family History  Problem Relation Age of Onset  . Colon cancer Sister        age 47s  . Myasthenia gravis Sister   . Bladder Cancer Brother   . Lung cancer Brother        age 52  . Alzheimer's disease Mother   . COPD Father   . Asthma Father   . Alcohol abuse Brother        likely died of pneumonia    Social History   Social History Narrative   Husband died after over 58 years and then a year after husband died, she met a man who she had a relationship with for over ten years. He died in 10-Sep-2017 after long illness.   Live alone.    Has two children, grown sons.    Gets out with neices, enjoys going out to eat, going to [employer].   Goes to church.    Eats all food groups, eats some meat.       Social History  Tobacco Use  . Smoking status: Never Smoker  . Smokeless tobacco: Never Used  Substance Use Topics  . Alcohol use: No     Current Meds  Medication Sig  . aspirin EC 81 MG tablet Take 81 mg by mouth daily.  . Cholecalciferol (VITAMIN D-3) 125 MCG (5000 UT) TABS Take 5,000 Units by mouth daily at 12 noon.   Marland Kitchen lisinopril (ZESTRIL) 10 MG tablet TAKE ONE (1) TABLET BY MOUTH EVERY DAY  . lisinopril (ZESTRIL) 5 MG tablet TAKE ONE (1) TABLET BY MOUTH EVERY DAY  . sodium phosphate Pediatric (FLEET) 3.5-9.5 GM/59ML enema Place 1 enema rectally once as needed for severe constipation.  . Suppository Base PLLT 1 suppository by Does not apply route. Every few days as needed for constipation  . thyroid (NP THYROID) 60 MG tablet Take 1 tablet (60 mg total) by mouth daily before breakfast.    ROS:  Review of Systems  Constitutional: Positive for malaise/fatigue.  Respiratory: Negative for shortness of breath.   Cardiovascular: Negative for chest pain and palpitations.  Neurological: Negative for dizziness and headaches.     Objective:   Today's Vitals: BP 122/74   Pulse 62    Temp (!) 97.3 F (36.3 C)   Resp 16   Ht _0  (1.6 m)   Wt 163 lb (73.9 kg)   LMP 07/20/1990 (Approximate)   SpO2 93%   BMI 28.87 kg/m  Vitals with BMI 04/03/2020 01/10/2020 01/04/2020  Height _1  5' 3.5" 5' 3.5"  Weight 163 lbs 167 lbs 169 lbs 6 oz  BMI 28.88 86.76 72.09  Systolic 470 962 836  Diastolic 74 80 80  Pulse 62 64 63     Physical Exam Vitals reviewed.  Constitutional:      General: She is not in acute distress.    Appearance: Normal appearance.  HENT:     Head: Normocephalic and atraumatic.  Neck:     Vascular: No carotid bruit.  Cardiovascular:     Rate and Rhythm: Normal rate and regular rhythm.     Pulses: Normal pulses.     Heart sounds: Normal heart sounds.  Pulmonary:     Effort: Pulmonary effort is normal.     Breath sounds: Normal breath sounds.  Skin:    General: Skin is warm and dry.  Neurological:     General: No focal deficit present.     Mental Status: She is alert and oriented to person, place, and time.  Psychiatric:        Mood and Affect: Mood normal.        Behavior: Behavior normal.        Judgment: Judgment normal.          Assessment and Plan   1. Hypothyroidism, unspecified type   2. Leg cramps      Plan: 1.  We will check thyroid levels today and further recommendations we made based upon those results. 2.  I recommended she try a vitamin B complex to see if this improves her nocturnal leg cramps.  Told her she get this over-the-counter and take 1 daily. If this results in less cramps she can continue, but if she does not feel any difference she can stop the supplement. Will also check her CMP today to monitor electrolyte levels.   Tests ordered Orders Placed This Encounter  Procedures  . TSH  . T3, Free  . T4, Free  . CMP with eGFR([facility])      No orders  of the defined types were placed in this encounter.   Patient to follow-up in 3 months or sooner as needed  Ailene Ards, NP

## 2020-04-04 ENCOUNTER — Encounter (INDEPENDENT_AMBULATORY_CARE_PROVIDER_SITE_OTHER): Payer: Self-pay | Admitting: Nurse Practitioner

## 2020-04-04 LAB — COMPLETE METABOLIC PANEL WITH GFR
AG Ratio: 1.6 (calc) (ref 1.0–2.5)
ALT: 9 U/L (ref 6–29)
AST: 15 U/L (ref 10–35)
Albumin: 3.9 g/dL (ref 3.6–5.1)
Alkaline phosphatase (APISO): 75 U/L (ref 37–153)
BUN: 21 mg/dL (ref 7–25)
CO2: 27 mmol/L (ref 20–32)
Calcium: 9.5 mg/dL (ref 8.6–10.4)
Chloride: 107 mmol/L (ref 98–110)
Creat: 0.92 mg/dL (ref 0.60–0.93)
GFR, Est African American: 70 mL/min/{1.73_m2} (ref >=60)
GFR, Est Non African American: 60 mL/min/{1.73_m2} (ref >=60)
Globulin: 2.5 g/dL (calc) (ref 1.9–3.7)
Glucose, Bld: 90 mg/dL (ref 65–99)
Potassium: 4.5 mmol/L (ref 3.5–5.3)
Sodium: 141 mmol/L (ref 135–146)
Total Bilirubin: 0.5 mg/dL (ref 0.2–1.2)
Total Protein: 6.4 g/dL (ref 6.1–8.1)

## 2020-04-04 LAB — TSH: TSH: 0.27 mIU/L — ABNORMAL LOW (ref 0.40–4.50)

## 2020-04-04 LAB — T3, FREE: T3, Free: 4.9 pg/mL — ABNORMAL HIGH (ref 2.3–4.2)

## 2020-04-04 LAB — T4, FREE: Free T4: 0.8 ng/dL (ref 0.8–1.8)

## 2020-04-04 NOTE — Progress Notes (Signed)
Pt said she was on NP thyroid 90/15 mg of the thyroid. The 60 mg is working well she stated.

## 2020-04-08 ENCOUNTER — Other Ambulatory Visit (INDEPENDENT_AMBULATORY_CARE_PROVIDER_SITE_OTHER): Payer: Self-pay | Admitting: Nurse Practitioner

## 2020-04-08 DIAGNOSIS — E039 Hypothyroidism, unspecified: Secondary | ICD-10-CM

## 2020-05-06 ENCOUNTER — Other Ambulatory Visit (INDEPENDENT_AMBULATORY_CARE_PROVIDER_SITE_OTHER): Payer: Self-pay | Admitting: Internal Medicine

## 2020-05-21 ENCOUNTER — Other Ambulatory Visit: Payer: Self-pay

## 2020-05-21 ENCOUNTER — Ambulatory Visit (INDEPENDENT_AMBULATORY_CARE_PROVIDER_SITE_OTHER): Payer: Medicare PPO

## 2020-05-21 DIAGNOSIS — Z23 Encounter for immunization: Secondary | ICD-10-CM

## 2020-05-21 NOTE — Progress Notes (Signed)
Patient was given High Dose Flu Vaccine in Left Deltoid today.  Patient tolerated the injection well.

## 2020-06-03 ENCOUNTER — Other Ambulatory Visit (INDEPENDENT_AMBULATORY_CARE_PROVIDER_SITE_OTHER): Payer: Self-pay | Admitting: Nurse Practitioner

## 2020-06-03 DIAGNOSIS — I10 Essential (primary) hypertension: Secondary | ICD-10-CM

## 2020-07-04 ENCOUNTER — Ambulatory Visit (INDEPENDENT_AMBULATORY_CARE_PROVIDER_SITE_OTHER): Payer: Medicare PPO | Admitting: Internal Medicine

## 2020-07-04 ENCOUNTER — Encounter (INDEPENDENT_AMBULATORY_CARE_PROVIDER_SITE_OTHER): Payer: Self-pay | Admitting: Internal Medicine

## 2020-07-04 ENCOUNTER — Other Ambulatory Visit: Payer: Self-pay

## 2020-07-04 VITALS — BP 124/86 | HR 60 | Temp 97.3°F | Ht 63.0 in | Wt 162.6 lb

## 2020-07-04 DIAGNOSIS — K5909 Other constipation: Secondary | ICD-10-CM | POA: Diagnosis not present

## 2020-07-04 DIAGNOSIS — I10 Essential (primary) hypertension: Secondary | ICD-10-CM | POA: Diagnosis not present

## 2020-07-04 DIAGNOSIS — E039 Hypothyroidism, unspecified: Secondary | ICD-10-CM | POA: Diagnosis not present

## 2020-07-04 MED ORDER — NP THYROID 90 MG PO TABS: 90.0000 mg | ORAL_TABLET | Freq: Every day | ORAL | 1 refills | Status: DC

## 2020-07-04 NOTE — Progress Notes (Signed)
Metrics: Intervention Frequency ACO  Documented Smoking Status Yearly  Screened one or more times in 24 months  Cessation Counseling or  Active cessation medication Past 24 months  Past 24 months   Guideline developer: UpToDate (See UpToDate for funding source) Date Released: 2014       Wellness Office Visit  Subjective:  Patient ID: [name], female    DOB: [birth year]  Age: 79 y.o. MRN: [id number]  CC: This lady comes in for follow-up of hypothyroidism, hypertension and chronic constipation. HPI  She is having more trouble with her constipation lately and she does not even have the urge that she needs to open her bowels which makes it more difficult.  She does not drink enough water by her own admission. She has also noticed more hair loss and thinning of her eyebrows.  I see that her dose of NP thyroid had been reduced previously. Past Medical History:  Diagnosis Date  . Allergy   . Anemia    prior to hysterectomy  . Back pain    comepensating for knee pain per pt  . Cataract    right eye and immature  . Complication of anesthesia   . Constipation    takes Colace daily and Milk of Mag  . CTS (carpal tunnel syndrome) 08/31/2016  . Dizziness    but goes away real quick  . Dry eyes    uses Eye Drops daily as needed  . Female hypogonadism syndrome   . GERD (gastroesophageal reflux disease)   . History of colon polyps   . Hyperlipidemia    was on meds but stopped taking back in June 2015  . Hypertension    takes Lisinopril daily  . Hypothyroidism    takes Synthroid daily  . Joint pain   . Obesity (BMI 30.0-34.9)   . Peripheral arterial disease ([place])   . Pneumonia    as a baby  . PONV (postoperative nausea and vomiting)   . Primary localized osteoarthritis of left knee 04/17/2015  . Primary localized osteoarthritis of right knee    knees  . Urinary frequency   . Urinary urgency    Past Surgical History:  Procedure Laterality Date  . ABDOMINAL HYSTERECTOMY   90's   complete  . APPENDECTOMY  80's  . BACK SURGERY    . CATARACT EXTRACTION W/PHACO Right 12/23/2015   Procedure: CATARACT EXTRACTION PHACO AND INTRAOCULAR LENS PLACEMENT RIGHT EYE CDE=6.42;  Surgeon: Williams Che, MD;  Location: [facility];  Service: Ophthalmology;  Laterality: Right;  . COLONOSCOPY  09/09/2004   CNO:[id number] rectum and colon  . COLONOSCOPY N/A 11/08/2013   Procedure: COLONOSCOPY;  Surgeon: Daneil Dolin, MD;  [facility];  Service: Endoscopy;  Laterality: N/A;  10:45  . JOINT REPLACEMENT     2015, 2016  . NECK SURGERY  1983  . RECTOCELE REPAIR  10/2007  . SPINE SURGERY     ruptured disc  . TOTAL KNEE ARTHROPLASTY Right 04/04/2014   DR Noemi Chapel  . TOTAL KNEE ARTHROPLASTY Right 04/04/2014   Procedure: RIGHT TOTAL KNEE ARTHROPLASTY;  Surgeon: Lorn Junes, MD;  Location: [place];  Service: Orthopedics;  Laterality: Right;  . TOTAL KNEE ARTHROPLASTY Left 04/29/2015   Procedure: TOTAL KNEE ARTHROPLASTY;  Surgeon: Elsie Saas, MD;  Location: [place];  Service: Orthopedics;  Laterality: Left;  . TUBAL LIGATION       Family History  Problem Relation Age of Onset  . Colon cancer Sister  age 76s  . Myasthenia gravis Sister   . Bladder Cancer Brother   . Lung cancer Brother        age 39  . Alzheimer's disease Mother   . COPD Father   . Asthma Father   . Alcohol abuse Brother        likely died of pneumonia    Social History   Social History Narrative   Husband died after over 65 years and then a year after husband died, she met a man who she had a relationship with for over ten years. He died in Oct 04, 2017 after long illness.   Live alone.    Has two children, grown sons.    Gets out with neices, enjoys going out to eat, going to [employer].   Goes to church.    Eats all food groups, eats some meat.       Social History   Tobacco Use  . Smoking status: Never Smoker  . Smokeless tobacco: Never Used  Substance Use Topics  . Alcohol use: No     Current Meds  Medication Sig  . aspirin EC 81 MG tablet Take 81 mg by mouth daily.  Marland Kitchen b complex vitamins capsule Take 1 capsule by mouth daily.  . Cholecalciferol (VITAMIN D-3) 125 MCG (5000 UT) TABS Take 5,000 Units by mouth daily at 12 noon.   Marland Kitchen lisinopril (ZESTRIL) 10 MG tablet TAKE ONE (1) TABLET BY MOUTH EVERY DAY. TAKE WITH 5MG TO GET TOTAL OF 15MG  . lisinopril (ZESTRIL) 5 MG tablet TAKE ONE (1) TABLET BY MOUTH EVERY DAY  . Propylene Glycol (SYSTANE COMPLETE OP) Apply to eye.  . sodium phosphate Pediatric (FLEET) 3.5-9.5 GM/59ML enema Place 1 enema rectally once as needed for severe constipation.  . Suppository Base PLLT 1 suppository by Does not apply route. Every few days as needed for constipation  . [DISCONTINUED] NP THYROID 60 MG tablet TAKE ONE TABLET (60MG TOTAL) BY MOUTH DAILY BEFORE BREAKFAST      Depression screen [facility] 2/9 07/04/2019 02/10/2018 12/30/2017 11/29/2017 11/09/2016  Decreased Interest 0 0 1 3 0  Down, Depressed, Hopeless 0 0 1 3 0  PHQ - 2 Score 0 0 2 6 0  Altered sleeping - 0 1 2 -  Tired, decreased energy - '1 1 2 ' -  Change in appetite - 0 0 0 -  Feeling bad or failure about yourself  - 0 0 0 -  Trouble concentrating - 0 0 0 -  Moving slowly or fidgety/restless - 0 0 0 -  Suicidal thoughts - 0 0 0 -  PHQ-9 Score - '1 4 10 ' -  Difficult doing work/chores - Not difficult at all Not difficult at all Somewhat difficult -     Objective:   Today's Vitals: BP 124/86   Pulse 60   Temp (!) 97.3 F (36.3 C) (Temporal)   Ht '5\' 3"'  (1.6 m)   Wt 162 lb 9.6 oz (73.8 kg)   LMP 07/20/1990 (Approximate)   SpO2 98%   BMI 28.80 kg/m  Vitals with BMI 07/04/2020 04/03/2020 01/10/2020  Height '5\' 3"'  '5\' 3"'  5' 3.5"  Weight 162 lbs 10 oz 163 lbs 167 lbs  BMI 28.81 76.22 63.33  Systolic 545 625 638  Diastolic 86 74 80  Pulse 60 62 64     Physical Exam   She looks systemically well.  Weight is very stable.  Blood pressure is acceptable.    Assessment   1.  Acquired  hypothyroidism   2. Essential hypertension   3. Chronic constipation       Tests ordered No orders of the defined types were placed in this encounter.    Plan: 1. I think we can further optimize her thyroid and I have told her to increase the NP thyroid to 90 mg daily and I have sent a new prescription. 2. She will continue with lisinopril 15 mg daily as before. 3. As far as her chronic constipation is concerned, we discussed making sure that she drinks enough water, fiber in her diet and avoiding things that will constipate her.  I suspect the increase of the thyroid may actually help also. 4. I will see her in a couple of months time to see how she is doing and we will likely do blood work then.   Meds ordered this encounter  Medications  . NP THYROID 90 MG tablet    Sig: Take 1 tablet (90 mg total) by mouth daily.    Dispense:  90 tablet    Refill:  1    Claude Swendsen Luther Parody, MD

## 2020-07-09 ENCOUNTER — Other Ambulatory Visit: Payer: Self-pay

## 2020-07-09 ENCOUNTER — Other Ambulatory Visit: Payer: Medicare PPO

## 2020-07-09 DIAGNOSIS — Z20822 Contact with and (suspected) exposure to covid-19: Secondary | ICD-10-CM

## 2020-07-11 LAB — SARS-COV-2, NAA 2 DAY TAT

## 2020-07-11 LAB — NOVEL CORONAVIRUS, NAA: SARS-CoV-2, NAA: NOT DETECTED

## 2020-07-11 LAB — SPECIMEN STATUS REPORT

## 2020-08-01 ENCOUNTER — Other Ambulatory Visit (INDEPENDENT_AMBULATORY_CARE_PROVIDER_SITE_OTHER): Payer: Self-pay | Admitting: Internal Medicine

## 2020-09-05 ENCOUNTER — Other Ambulatory Visit (INDEPENDENT_AMBULATORY_CARE_PROVIDER_SITE_OTHER): Payer: Self-pay | Admitting: Internal Medicine

## 2020-09-05 ENCOUNTER — Encounter (INDEPENDENT_AMBULATORY_CARE_PROVIDER_SITE_OTHER): Payer: Self-pay | Admitting: Internal Medicine

## 2020-09-05 ENCOUNTER — Ambulatory Visit (INDEPENDENT_AMBULATORY_CARE_PROVIDER_SITE_OTHER): Payer: Medicare PPO | Admitting: Internal Medicine

## 2020-09-05 ENCOUNTER — Other Ambulatory Visit: Payer: Self-pay

## 2020-09-05 VITALS — BP 120/80 | HR 72 | Ht 63.0 in | Wt 159.2 lb

## 2020-09-05 DIAGNOSIS — Z1159 Encounter for screening for other viral diseases: Secondary | ICD-10-CM | POA: Diagnosis not present

## 2020-09-05 DIAGNOSIS — I10 Essential (primary) hypertension: Secondary | ICD-10-CM

## 2020-09-05 DIAGNOSIS — E039 Hypothyroidism, unspecified: Secondary | ICD-10-CM | POA: Diagnosis not present

## 2020-09-05 DIAGNOSIS — E559 Vitamin D deficiency, unspecified: Secondary | ICD-10-CM | POA: Diagnosis not present

## 2020-09-05 DIAGNOSIS — E782 Mixed hyperlipidemia: Secondary | ICD-10-CM | POA: Diagnosis not present

## 2020-09-05 NOTE — Progress Notes (Signed)
Metrics: Intervention Frequency ACO  Documented Smoking Status Yearly  Screened one or more times in 24 months  Cessation Counseling or  Active cessation medication Past 24 months  Past 24 months   Guideline developer: UpToDate (See UpToDate for funding source) Date Released: 2014       Wellness Office Visit  Subjective:  Patient ID: [name], female    DOB: Oct 23, 1940  Age: 80 y.o. MRN: [id number]  CC: This lady comes in for follow-up of hypertension, hypothyroidism, dyslipidemia, vitamin D deficiency. HPI  She has done really well in terms of nutrition and losing weight.  She is also tolerated higher dose of NP thyroid 90 mg daily although she does describe feeling jittery sometimes although this symptom is getting better. She continues on lisinopril total dose of 15 mg daily. She denies any chest pain, dyspnea, palpitations or limb weakness. She continues on vitamin D3 5000 units daily.  Her levels were very good the last time they were checked. Past Medical History:  Diagnosis Date  . Allergy   . Anemia    prior to hysterectomy  . Back pain    comepensating for knee pain per pt  . Cataract    right eye and immature  . Complication of anesthesia   . Constipation    takes Colace daily and Milk of Mag  . CTS (carpal tunnel syndrome) 08/31/2016  . Dizziness    but goes away real quick  . Dry eyes    uses Eye Drops daily as needed  . Female hypogonadism syndrome   . GERD (gastroesophageal reflux disease)   . History of colon polyps   . Hyperlipidemia    was on meds but stopped taking back in June 2015  . Hypertension    takes Lisinopril daily  . Hypothyroidism    takes Synthroid daily  . Joint pain   . Obesity (BMI 30.0-34.9)   . Peripheral arterial disease ([place])   . Pneumonia    as a baby  . PONV (postoperative nausea and vomiting)   . Primary localized osteoarthritis of left knee 04/17/2015  . Primary localized osteoarthritis of right knee    knees  .  Urinary frequency   . Urinary urgency    Past Surgical History:  Procedure Laterality Date  . ABDOMINAL HYSTERECTOMY  90's   complete  . APPENDECTOMY  80's  . BACK SURGERY    . CATARACT EXTRACTION W/PHACO Right 12/23/2015   Procedure: CATARACT EXTRACTION PHACO AND INTRAOCULAR LENS PLACEMENT RIGHT EYE CDE=6.42;  Surgeon: Williams Che, MD;  [facility];  Service: Ophthalmology;  Laterality: Right;  . COLONOSCOPY  09/09/2004   [id number] rectum and colon  . COLONOSCOPY N/A 11/08/2013   Procedure: COLONOSCOPY;  Surgeon: Daneil Dolin, MD;  [facility];  Service: Endoscopy;  Laterality: N/A;  10:45  . JOINT REPLACEMENT     2015, 2016  . NECK SURGERY  1983  . RECTOCELE REPAIR  10/2007  . SPINE SURGERY     ruptured disc  . TOTAL KNEE ARTHROPLASTY Right 04/04/2014   DR Noemi Chapel  . TOTAL KNEE ARTHROPLASTY Right 04/04/2014   Procedure: RIGHT TOTAL KNEE ARTHROPLASTY;  Surgeon: Lorn Junes, MD;  Location: [place];  Service: Orthopedics;  Laterality: Right;  . TOTAL KNEE ARTHROPLASTY Left 04/29/2015   Procedure: TOTAL KNEE ARTHROPLASTY;  Surgeon: Elsie Saas, MD;  Location: [place];  Service: Orthopedics;  Laterality: Left;  . TUBAL LIGATION       Family History  Problem Relation Age of Onset  . Colon cancer Sister        age 27s  . Myasthenia gravis Sister   . Bladder Cancer Brother   . Lung cancer Brother        age 52  . Alzheimer's disease Mother   . COPD Father   . Asthma Father   . Alcohol abuse Brother        likely died of pneumonia    Social History   Social History Narrative   Husband died after over 74 years and then a year after husband died, she met a man who she had a relationship with for over ten years. He died in 09-17-17 after long illness.   Live alone.    Has two children, grown sons.    Gets out with neices, enjoys going out to eat, going to [employer].   Goes to church.    Eats all food groups, eats some meat.       Social History    Tobacco Use  . Smoking status: Never Smoker  . Smokeless tobacco: Never Used  Substance Use Topics  . Alcohol use: No    Current Meds  Medication Sig  . aspirin EC 81 MG tablet Take 81 mg by mouth daily.  Marland Kitchen b complex vitamins capsule Take 1 capsule by mouth daily.  . Cholecalciferol (VITAMIN D-3) 125 MCG (5000 UT) TABS Take 5,000 Units by mouth daily at 12 noon.   Marland Kitchen lisinopril (ZESTRIL) 10 MG tablet TAKE ONE (1) TABLET BY MOUTH EVERY DAY. TAKE WITH 5MG TO GET TOTAL OF 15MG  . lisinopril (ZESTRIL) 5 MG tablet TAKE ONE (1) TABLET BY MOUTH EVERY DAY  . NP THYROID 90 MG tablet Take 1 tablet (90 mg total) by mouth daily.     [place] Office Visit from 02/10/2018 in [facility]  PHQ-9 Total Score 1      Objective:   Today's Vitals: BP 120/80   Pulse 72   Ht '5\' 3"'  (1.6 m)   Wt 159 lb 3.2 oz (72.2 kg)   LMP 07/20/1990 (Approximate)   BMI 28.20 kg/m  Vitals with BMI 09/05/2020 07/04/2020 04/03/2020  Height '5\' 3"'  '5\' 3"'  '5\' 3"'   Weight 159 lbs 3 oz 162 lbs 10 oz 163 lbs  BMI 28.21 70.62 37.62  Systolic 831 517 616  Diastolic 80 86 74  Pulse 72 60 62     Physical Exam   She looks systemically well.  She has lost a further 3 pounds since last time I saw her.  Blood pressure is excellent.    Assessment   1. Essential hypertension   2. Acquired hypothyroidism   3. Vitamin D deficiency   4. Mixed hyperlipidemia   5. Encounter for hepatitis C screening test for low risk patient       Tests ordered Orders Placed This Encounter  Procedures  . COMPLETE METABOLIC PANEL WITH GFR  . Lipid panel  . Hepatitis C antibody     Plan: 1. She will continue with current dose of lisinopril for her hypertension which is keeping her blood pressure under good control.  We will check renal function. 2. She will continue with NP thyroid 90 mg daily and she is tolerating this. 3. She will continue to eat healthy and we will check a lipid panel today. 4. Check  hepatitis C antibody as a screening test. 5. Follow-up in about 4 months.   No orders of the defined types  were placed in this encounter.   Doree Albee, MD

## 2020-09-09 ENCOUNTER — Other Ambulatory Visit: Payer: Self-pay

## 2020-09-09 ENCOUNTER — Other Ambulatory Visit (INDEPENDENT_AMBULATORY_CARE_PROVIDER_SITE_OTHER): Payer: Medicare PPO

## 2020-09-09 DIAGNOSIS — E782 Mixed hyperlipidemia: Secondary | ICD-10-CM | POA: Diagnosis not present

## 2020-09-09 DIAGNOSIS — Z1159 Encounter for screening for other viral diseases: Secondary | ICD-10-CM | POA: Diagnosis not present

## 2020-09-09 DIAGNOSIS — I10 Essential (primary) hypertension: Secondary | ICD-10-CM | POA: Diagnosis not present

## 2020-09-10 LAB — LIPID PANEL
Cholesterol: 190 mg/dL (ref <200)
HDL: 43 mg/dL — ABNORMAL LOW (ref >=50)
LDL Cholesterol (Calc): 122 mg/dL (calc) — ABNORMAL HIGH
Non-HDL Cholesterol (Calc): 147 mg/dL (calc) — ABNORMAL HIGH (ref <130)
Total CHOL/HDL Ratio: 4.4 (calc) (ref <5.0)
Triglycerides: 134 mg/dL (ref <150)

## 2020-09-10 LAB — COMPLETE METABOLIC PANEL WITH GFR
AG Ratio: 1.6 (calc) (ref 1.0–2.5)
ALT: 11 U/L (ref 6–29)
AST: 16 U/L (ref 10–35)
Albumin: 3.8 g/dL (ref 3.6–5.1)
Alkaline phosphatase (APISO): 69 U/L (ref 37–153)
BUN: 18 mg/dL (ref 7–25)
CO2: 28 mmol/L (ref 20–32)
Calcium: 9.2 mg/dL (ref 8.6–10.4)
Chloride: 105 mmol/L (ref 98–110)
Creat: 0.83 mg/dL (ref 0.60–0.93)
GFR, Est African American: 79 mL/min/{1.73_m2} (ref >=60)
GFR, Est Non African American: 68 mL/min/{1.73_m2} (ref >=60)
Globulin: 2.4 g/dL (calc) (ref 1.9–3.7)
Glucose, Bld: 92 mg/dL (ref 65–99)
Potassium: 4.4 mmol/L (ref 3.5–5.3)
Sodium: 140 mmol/L (ref 135–146)
Total Bilirubin: 0.4 mg/dL (ref 0.2–1.2)
Total Protein: 6.2 g/dL (ref 6.1–8.1)

## 2020-09-10 LAB — HEPATITIS C ANTIBODY
Hepatitis C Ab: NONREACTIVE
SIGNAL TO CUT-OFF: 0.01 (ref <1.00)

## 2020-10-02 DIAGNOSIS — D485 Neoplasm of uncertain behavior of skin: Secondary | ICD-10-CM | POA: Diagnosis not present

## 2020-10-02 DIAGNOSIS — L814 Other melanin hyperpigmentation: Secondary | ICD-10-CM | POA: Diagnosis not present

## 2020-10-02 DIAGNOSIS — L821 Other seborrheic keratosis: Secondary | ICD-10-CM | POA: Diagnosis not present

## 2020-10-02 DIAGNOSIS — L280 Lichen simplex chronicus: Secondary | ICD-10-CM | POA: Diagnosis not present

## 2020-10-02 DIAGNOSIS — L570 Actinic keratosis: Secondary | ICD-10-CM | POA: Diagnosis not present

## 2020-10-02 DIAGNOSIS — L820 Inflamed seborrheic keratosis: Secondary | ICD-10-CM | POA: Diagnosis not present

## 2020-10-07 ENCOUNTER — Telehealth (INDEPENDENT_AMBULATORY_CARE_PROVIDER_SITE_OTHER): Payer: Self-pay

## 2020-10-07 ENCOUNTER — Other Ambulatory Visit (INDEPENDENT_AMBULATORY_CARE_PROVIDER_SITE_OTHER): Payer: Self-pay | Admitting: Internal Medicine

## 2020-10-07 MED ORDER — NP THYROID 90 MG PO TABS: 90.0000 mg | ORAL_TABLET | Freq: Every day | ORAL | 1 refills | Status: DC

## 2020-10-07 NOTE — Telephone Encounter (Signed)
Patient called and stated that she needs a refill sent to [facility] in [place] for the following medication:  NP THYROID 90 MG tablet  Last filled 07/04/2020, # 90 with 1 refill  Patient said this is cheaper at [facility] in [place] and wants this to be sent there.  Last OV 09/05/2020  Next OV 01/06/2021

## 2020-11-04 ENCOUNTER — Other Ambulatory Visit (INDEPENDENT_AMBULATORY_CARE_PROVIDER_SITE_OTHER): Payer: Self-pay | Admitting: Internal Medicine

## 2020-11-18 DIAGNOSIS — L309 Dermatitis, unspecified: Secondary | ICD-10-CM | POA: Diagnosis not present

## 2020-11-25 ENCOUNTER — Other Ambulatory Visit (HOSPITAL_COMMUNITY): Payer: Self-pay | Admitting: Internal Medicine

## 2020-11-25 DIAGNOSIS — Z1231 Encounter for screening mammogram for malignant neoplasm of breast: Secondary | ICD-10-CM

## 2020-11-27 ENCOUNTER — Ambulatory Visit (HOSPITAL_COMMUNITY)
Admission: RE | Admit: 2020-11-27 | Discharge: 2020-11-27 | Disposition: A | Discharge status: Home / Self Care | Payer: Medicare PPO | Source: Ambulatory Visit | Attending: Internal Medicine | Admitting: Internal Medicine

## 2020-11-27 DIAGNOSIS — Z1231 Encounter for screening mammogram for malignant neoplasm of breast: Secondary | ICD-10-CM | POA: Insufficient documentation

## 2020-12-05 ENCOUNTER — Other Ambulatory Visit (INDEPENDENT_AMBULATORY_CARE_PROVIDER_SITE_OTHER): Payer: Self-pay | Admitting: Internal Medicine

## 2020-12-05 DIAGNOSIS — I10 Essential (primary) hypertension: Secondary | ICD-10-CM

## 2021-01-06 ENCOUNTER — Encounter: Payer: Self-pay | Admitting: Internal Medicine

## 2021-01-06 ENCOUNTER — Encounter (INDEPENDENT_AMBULATORY_CARE_PROVIDER_SITE_OTHER): Payer: Self-pay | Admitting: Internal Medicine

## 2021-01-06 ENCOUNTER — Ambulatory Visit (INDEPENDENT_AMBULATORY_CARE_PROVIDER_SITE_OTHER): Payer: Medicare PPO | Admitting: Internal Medicine

## 2021-01-06 ENCOUNTER — Other Ambulatory Visit: Payer: Self-pay

## 2021-01-06 VITALS — BP 130/76 | HR 60 | Temp 97.1°F | Ht 63.0 in | Wt 162.0 lb

## 2021-01-06 DIAGNOSIS — K5909 Other constipation: Secondary | ICD-10-CM | POA: Diagnosis not present

## 2021-01-06 DIAGNOSIS — I10 Essential (primary) hypertension: Secondary | ICD-10-CM | POA: Diagnosis not present

## 2021-01-06 DIAGNOSIS — E559 Vitamin D deficiency, unspecified: Secondary | ICD-10-CM

## 2021-01-06 DIAGNOSIS — E039 Hypothyroidism, unspecified: Secondary | ICD-10-CM | POA: Diagnosis not present

## 2021-01-06 NOTE — Progress Notes (Signed)
Metrics: Intervention Frequency ACO  Documented Smoking Status Yearly  Screened one or more times in 24 months  Cessation Counseling or  Active cessation medication Past 24 months  Past 24 months   Guideline developer: UpToDate (See UpToDate for funding source) Date Released: 2014       Wellness Office Visit  Subjective:  Patient ID: [name], female    DOB: [birth year]  Age: 80 y.o. MRN: [id number]  CC: This lady comes in for follow-up of hypertension, hypothyroidism. HPI  She tells me that she has been feeling somewhat jittery and nervous.  She was feeling like this before but it seemed to settle down. She has also noticed that her blood pressure readings are very low at times.  She notices that when she takes magnesium tablets, they tend to be lower. She tends to still have problems with chronic constipation and she has never seen a gastroenterologist.  I think she would like to see one and possibly have a colonoscopy. Past Medical History:  Diagnosis Date   Allergy    Anemia    prior to hysterectomy   Back pain    comepensating for knee pain per pt   Cataract    right eye and immature   Complication of anesthesia    Constipation    takes Colace daily and Milk of Mag   CTS (carpal tunnel syndrome) 08/31/2016   Dizziness    but goes away real quick   Dry eyes    uses Eye Drops daily as needed   Female hypogonadism syndrome    GERD (gastroesophageal reflux disease)    History of colon polyps    Hyperlipidemia    was on meds but stopped taking back in June 2015   Hypertension    takes Lisinopril daily   Hypothyroidism    takes Synthroid daily   Joint pain    Obesity (BMI 30.0-34.9)    Peripheral arterial disease ([facility])    Pneumonia    as a baby   PONV (postoperative nausea and vomiting)    Primary localized osteoarthritis of left knee 04/17/2015   Primary localized osteoarthritis of right knee    knees   Urinary frequency    Urinary urgency    Past Surgical  History:  Procedure Laterality Date   ABDOMINAL HYSTERECTOMY  90's   complete   APPENDECTOMY  80's   BACK SURGERY     CATARACT EXTRACTION W/PHACO Right 12/23/2015   Procedure: CATARACT EXTRACTION PHACO AND INTRAOCULAR LENS PLACEMENT RIGHT EYE CDE=6.42;  Surgeon: Williams Che, MD;  [facility];  Service: Ophthalmology;  Laterality: Right;   COLONOSCOPY  09/09/2004   [id number] rectum and colon   COLONOSCOPY N/A 11/08/2013   Procedure: COLONOSCOPY;  Surgeon: Daneil Dolin, MD;  [facility];  Service: Endoscopy;  Laterality: N/A;  10:45   JOINT REPLACEMENT     2015, 2016   [facility]   RECTOCELE REPAIR  10/2007   SPINE SURGERY     ruptured disc   TOTAL KNEE ARTHROPLASTY Right 04/04/2014   DR Noemi Chapel   TOTAL KNEE ARTHROPLASTY Right 04/04/2014   Procedure: RIGHT TOTAL KNEE ARTHROPLASTY;  Surgeon: Lorn Junes, MD;  Location: [place];  Service: Orthopedics;  Laterality: Right;   TOTAL KNEE ARTHROPLASTY Left 04/29/2015   Procedure: TOTAL KNEE ARTHROPLASTY;  Surgeon: Elsie Saas, MD;  Location: [place];  Service: Orthopedics;  Laterality: Left;   TUBAL LIGATION       Family  History  Problem Relation Age of Onset   Colon cancer Sister        age 41s   Myasthenia gravis Sister    Bladder Cancer Brother    Lung cancer Brother        age 53   Alzheimer's disease Mother    COPD Father    Asthma Father    Alcohol abuse Brother        likely died of pneumonia    Social History   Social History Narrative   Husband died after over 29 years and then a year after husband died, she met a man who she had a relationship with for over ten years. He died in September 28, 2017 after long illness.   Live alone.    Has two children, grown sons.    Gets out with neices, enjoys going out to eat, going to [employer].   Goes to church.    Eats all food groups, eats some meat.       Social History   Tobacco Use   Smoking status: Never   Smokeless tobacco: Never  Substance  Use Topics   Alcohol use: No    Current Meds  Medication Sig   aspirin EC 81 MG tablet Take 81 mg by mouth daily.   b complex vitamins capsule Take 1 capsule by mouth daily.   Cholecalciferol (VITAMIN D-3) 125 MCG (5000 UT) TABS Take 5,000 Units by mouth daily at 12 noon.    lisinopril (ZESTRIL) 10 MG tablet TAKE ONE (1) TABLET BY MOUTH EVERY DAY. TAKE WITH 5MG TO GET TOTAL OF 15MG   lisinopril (ZESTRIL) 5 MG tablet TAKE ONE (1) TABLET BY MOUTH EVERY DAY   NP THYROID 90 MG tablet Take 1 tablet (90 mg total) by mouth daily.     [place] Office Visit from 02/10/2018 in [facility]  PHQ-9 Total Score 1       Objective:   Today's Vitals: BP 130/76   Pulse 60   Temp (!) 97.1 F (36.2 C) (Temporal)   Ht '5\' 3"'  (1.6 m)   Wt 162 lb (73.5 kg)   LMP 07/20/1990 (Approximate)   SpO2 97%   BMI 28.70 kg/m  Vitals with BMI 01/06/2021 09/05/2020 07/04/2020  Height '5\' 3"'  '5\' 3"'  '5\' 3"'   Weight 162 lbs 159 lbs 3 oz 162 lbs 10 oz  BMI 28.7 93.26 71.24  Systolic 580 998 338  Diastolic 76 80 86  Pulse 60 72 60     Physical Exam She looks systemically well.  Blood pressure is in a good range.  Home readings were much lower than today's readings.  She is alert and orientated without any obvious focal logical signs.      Assessment   1. Essential hypertension   2. Acquired hypothyroidism   3. Vitamin D deficiency   4. Chronic constipation       Tests ordered Orders Placed This Encounter  Procedures   COMPLETE METABOLIC PANEL WITH GFR   T3, free   TSH   CBC   VITAMIN D 25 Hydroxy (Vit-D Deficiency, Fractures)   Ambulatory referral to Gastroenterology      Plan: 1.  Continue with lisinopril.  I told her that she should take magnesium 500 mg every night and if her blood pressure readings are lower, she can reduce the lisinopril to 10 mg daily only.  Eventually we may able to even reduce it more. 2.  Continue with NP thyroid 90 mg daily  and I have given her  samples today because she is does not have enough medications and I wanted to see what her thyroid function was before we change any dose if needed. 3.  Continue with vitamin D3 supplementation and we will check vitamin D levels. 4.  I will also check a CBC to make sure there is nothing else going on. 5.  I will refer to gastroenterology for her constipation. 6.  I will see her in about 3 months for an annual physical exam.    No orders of the defined types were placed in this encounter.   Doree Albee, MD

## 2021-01-07 LAB — COMPLETE METABOLIC PANEL WITH GFR
AG Ratio: 1.6 (calc) (ref 1.0–2.5)
ALT: 12 U/L (ref 6–29)
AST: 20 U/L (ref 10–35)
Albumin: 4.1 g/dL (ref 3.6–5.1)
Alkaline phosphatase (APISO): 72 U/L (ref 37–153)
BUN: 18 mg/dL (ref 7–25)
CO2: 29 mmol/L (ref 20–32)
Calcium: 9.3 mg/dL (ref 8.6–10.4)
Chloride: 105 mmol/L (ref 98–110)
Creat: 0.8 mg/dL (ref 0.60–0.93)
GFR, Est African American: 82 mL/min/{1.73_m2} (ref >=60)
GFR, Est Non African American: 71 mL/min/{1.73_m2} (ref >=60)
Globulin: 2.5 g/dL (calc) (ref 1.9–3.7)
Glucose, Bld: 93 mg/dL (ref 65–99)
Potassium: 4.2 mmol/L (ref 3.5–5.3)
Sodium: 141 mmol/L (ref 135–146)
Total Bilirubin: 0.4 mg/dL (ref 0.2–1.2)
Total Protein: 6.6 g/dL (ref 6.1–8.1)

## 2021-01-07 LAB — CBC
HCT: 42 % (ref 35.0–45.0)
Hemoglobin: 13.7 g/dL (ref 11.7–15.5)
MCH: 28.3 pg (ref 27.0–33.0)
MCHC: 32.6 g/dL (ref 32.0–36.0)
MCV: 86.8 fL (ref 80.0–100.0)
MPV: 10.5 fL (ref 7.5–12.5)
Platelets: 199 10*3/uL (ref 140–400)
RBC: 4.84 10*6/uL (ref 3.80–5.10)
RDW: 13.5 % (ref 11.0–15.0)
WBC: 6.4 10*3/uL (ref 3.8–10.8)

## 2021-01-07 LAB — T3, FREE: T3, Free: 6.2 pg/mL — ABNORMAL HIGH (ref 2.3–4.2)

## 2021-01-07 LAB — TSH: TSH: 0.03 mIU/L — ABNORMAL LOW (ref 0.40–4.50)

## 2021-01-07 LAB — VITAMIN D 25 HYDROXY (VIT D DEFICIENCY, FRACTURES): Vit D, 25-Hydroxy: 73 ng/mL (ref 30–100)

## 2021-02-03 ENCOUNTER — Other Ambulatory Visit (INDEPENDENT_AMBULATORY_CARE_PROVIDER_SITE_OTHER): Payer: Self-pay | Admitting: Internal Medicine

## 2021-02-04 ENCOUNTER — Encounter (INDEPENDENT_AMBULATORY_CARE_PROVIDER_SITE_OTHER): Payer: Self-pay | Admitting: Internal Medicine

## 2021-02-04 ENCOUNTER — Other Ambulatory Visit: Payer: Self-pay

## 2021-02-04 ENCOUNTER — Ambulatory Visit (INDEPENDENT_AMBULATORY_CARE_PROVIDER_SITE_OTHER): Payer: Medicare PPO | Admitting: Internal Medicine

## 2021-02-04 VITALS — BP 142/84 | HR 67 | Temp 97.9°F | Ht 63.0 in | Wt 161.6 lb

## 2021-02-04 DIAGNOSIS — Q383 Other congenital malformations of tongue: Secondary | ICD-10-CM

## 2021-02-04 NOTE — Progress Notes (Signed)
Metrics: Intervention Frequency ACO  Documented Smoking Status Yearly  Screened one or more times in 24 months  Cessation Counseling or  Active cessation medication Past 24 months  Past 24 months   Guideline developer: UpToDate (See UpToDate for funding source) Date Released: 2014       Wellness Office Visit  Subjective:  Patient ID: [name], female    DOB: 11-Sep-1940  Age: 80 y.o. MRN: [id number]  CC: Tongue abnormality HPI This lady comes in for an acute visit with about 3 to 4-week history of abnormality of the left side of her tongue.  She has noticed some lumps in this area.  It is somewhat sore.  She has no difficulty in speech or swallowing.  Past Medical History:  Diagnosis Date   Allergy    Anemia    prior to hysterectomy   Back pain    comepensating for knee pain per pt   Cataract    right eye and immature   Complication of anesthesia    Constipation    takes Colace daily and Milk of Mag   CTS (carpal tunnel syndrome) 08/31/2016   Dizziness    but goes away real quick   Dry eyes    uses Eye Drops daily as needed   Female hypogonadism syndrome    GERD (gastroesophageal reflux disease)    History of colon polyps    Hyperlipidemia    was on meds but stopped taking back in June 2015   Hypertension    takes Lisinopril daily   Hypothyroidism    takes Synthroid daily   Joint pain    Obesity (BMI 30.0-34.9)    Peripheral arterial disease (HCC)    Pneumonia    as a baby   PONV (postoperative nausea and vomiting)    Primary localized osteoarthritis of left knee 04/17/2015   Primary localized osteoarthritis of right knee    knees   Urinary frequency    Urinary urgency    Past Surgical History:  Procedure Laterality Date   ABDOMINAL HYSTERECTOMY  90's   complete   APPENDECTOMY  80's   BACK SURGERY     CATARACT EXTRACTION W/PHACO Right 12/23/2015   Procedure: CATARACT EXTRACTION PHACO AND INTRAOCULAR LENS PLACEMENT RIGHT EYE CDE=6.42;  Surgeon: Williams Che, MD;  Location: [facility];  Service: Ophthalmology;  Laterality: Right;   COLONOSCOPY  09/09/2004   [id number] rectum and colon   COLONOSCOPY N/A 11/08/2013   Procedure: COLONOSCOPY;  Surgeon: Daneil Dolin, MD;  Location: [facility];  Service: Endoscopy;  Laterality: N/A;  10:45   JOINT REPLACEMENT     2015, 2016   [place]   RECTOCELE REPAIR  10/2007   SPINE SURGERY     ruptured disc   TOTAL KNEE ARTHROPLASTY Right 04/04/2014   DR Noemi Chapel   TOTAL KNEE ARTHROPLASTY Right 04/04/2014   Procedure: RIGHT TOTAL KNEE ARTHROPLASTY;  Surgeon: Lorn Junes, MD;  Location: [facility];  Service: Orthopedics;  Laterality: Right;   TOTAL KNEE ARTHROPLASTY Left 04/29/2015   Procedure: TOTAL KNEE ARTHROPLASTY;  Surgeon: Elsie Saas, MD;  Location: [facility];  Service: Orthopedics;  Laterality: Left;   TUBAL LIGATION       Family History  Problem Relation Age of Onset   Colon cancer Sister        age 5s   Myasthenia gravis Sister    Bladder Cancer Brother    Lung cancer Brother  age 36   Alzheimer's disease Mother    COPD Father    Asthma Father    Alcohol abuse Brother        likely died of pneumonia    Social History   Social History Narrative   Husband died after over 40 years and then a year after husband died, she met a man who she had a relationship with for over ten years. He died in Sep 03, 2017 after long illness.   Live alone.    Has two children, grown sons.    Gets out with neices, enjoys going out to eat, going to [employer].   Goes to church.    Eats all food groups, eats some meat.       Social History   Tobacco Use   Smoking status: Never   Smokeless tobacco: Never  Substance Use Topics   Alcohol use: No    Current Meds  Medication Sig   aspirin EC 81 MG tablet Take 81 mg by mouth daily.   b complex vitamins capsule Take 1 capsule by mouth daily.   Cholecalciferol (VITAMIN D-3) 125 MCG (5000 UT) TABS Take 5,000 Units by mouth daily at 12 noon.     lisinopril (ZESTRIL) 10 MG tablet TAKE ONE (1) TABLET BY MOUTH EVERY DAY. TAKE WITH 5MG TO GET TOTAL OF 15MG   lisinopril (ZESTRIL) 5 MG tablet TAKE ONE (1) TABLET BY MOUTH EVERY DAY   NP THYROID 90 MG tablet Take 1 tablet (90 mg total) by mouth daily.     [facility] Office Visit from 02/10/2018 in [facility]  PHQ-9 Total Score 1       Objective:   Today's Vitals: BP (!) 142/84   Pulse 67   Temp 97.9 F (36.6 C) (Temporal)   Ht _0  (1.6 m)   Wt 161 lb 9.6 oz (73.3 kg)   LMP 07/20/1990 (Approximate)   SpO2 96%   BMI 28.63 kg/m  Vitals with BMI 02/04/2021 01/06/2021 09/05/2020  Height _1  _2  _3   Weight 161 lbs 10 oz 162 lbs 159 lbs 3 oz  BMI 28.63 70.2 63.78  Systolic 588 502 774  Diastolic 84 76 80  Pulse 67 60 72     Physical Exam She looks systemically well.  There does not appear to be any neck or supraclavicular lymphadenopathy.  Examination of the tongue does show some lesions on the lateral aspect/left side of the tongue.  I am not sure what these are.      Assessment   1. Tongue abnormality       Tests ordered Orders Placed This Encounter  Procedures   Ambulatory referral to ENT      Plan: 1.  I will make an urgent referral to ENT and if ENT, Dr. Benjamine Mola, does not feel he is appropriate, I am sure he will refer to the relevant specialist.    No orders of the defined types were placed in this encounter.   Doree Albee, MD

## 2021-02-10 DIAGNOSIS — K1230 Oral mucositis (ulcerative), unspecified: Secondary | ICD-10-CM | POA: Diagnosis not present

## 2021-02-10 DIAGNOSIS — H6123 Impacted cerumen, bilateral: Secondary | ICD-10-CM | POA: Diagnosis not present

## 2021-03-27 DIAGNOSIS — I10 Essential (primary) hypertension: Secondary | ICD-10-CM | POA: Diagnosis not present

## 2021-03-27 DIAGNOSIS — K5900 Constipation, unspecified: Secondary | ICD-10-CM | POA: Diagnosis not present

## 2021-03-27 DIAGNOSIS — Z7982 Long term (current) use of aspirin: Secondary | ICD-10-CM | POA: Diagnosis not present

## 2021-03-27 DIAGNOSIS — G629 Polyneuropathy, unspecified: Secondary | ICD-10-CM | POA: Diagnosis not present

## 2021-03-27 DIAGNOSIS — E039 Hypothyroidism, unspecified: Secondary | ICD-10-CM | POA: Diagnosis not present

## 2021-03-27 DIAGNOSIS — K08109 Complete loss of teeth, unspecified cause, unspecified class: Secondary | ICD-10-CM | POA: Diagnosis not present

## 2021-03-27 DIAGNOSIS — Z811 Family history of alcohol abuse and dependence: Secondary | ICD-10-CM | POA: Diagnosis not present

## 2021-03-27 DIAGNOSIS — R32 Unspecified urinary incontinence: Secondary | ICD-10-CM | POA: Diagnosis not present

## 2021-03-27 DIAGNOSIS — Z604 Social exclusion and rejection: Secondary | ICD-10-CM | POA: Diagnosis not present

## 2021-04-04 ENCOUNTER — Other Ambulatory Visit: Payer: Self-pay

## 2021-04-04 ENCOUNTER — Encounter: Payer: Self-pay | Admitting: Gastroenterology

## 2021-04-04 ENCOUNTER — Ambulatory Visit: Payer: Medicare PPO | Admitting: Gastroenterology

## 2021-04-04 VITALS — BP 129/70 | Temp 97.3°F | Ht 63.0 in | Wt 163.4 lb

## 2021-04-04 DIAGNOSIS — K5909 Other constipation: Secondary | ICD-10-CM

## 2021-04-04 DIAGNOSIS — R198 Other specified symptoms and signs involving the digestive system and abdomen: Secondary | ICD-10-CM

## 2021-04-04 DIAGNOSIS — R1030 Lower abdominal pain, unspecified: Secondary | ICD-10-CM | POA: Diagnosis not present

## 2021-04-04 MED ORDER — POLYETHYLENE GLYCOL 3350 17 GM/SCOOP PO POWD: 17.0000 g | Freq: Every day | ORAL | 11 refills | Status: DC

## 2021-04-04 NOTE — Patient Instructions (Signed)
Take one bottle of Magnesium Citrate per package label to empty bowels.  Start Miralax one capful once to twice daily until having regular soft stools, then continue 1/2 capful to 1 capful daily to maintain regular bowel movements. If your abdominal pain does not improve with better bowel movements, I would consider CT scan to investigate further. If bowel movements do not improve, I would consider updating colonoscopy.  Call if 2-3 weeks and ask to speak to Tammy to give an update on your symptoms.  Return to office in 2 months.

## 2021-04-04 NOTE — Progress Notes (Signed)
Primary Care Physician:  Doree Albee, MD (Inactive)  Primary Gastroenterologist:  Garfield Cornea, MD   Chief Complaint  Patient presents with   Constipation    Takes MOM to have a BM, last Saturday had a large loose bowel, since only hard BM's   Abdominal Pain    RLQ and across the bottom that started this week. Feels bloated    HPI:  [name] is a 80 y.o. female here for further evaluation of abdominal pain and constipation.  Patient was referred by former PCP, Dr. Hurshel Party. Patient last seen in April 2015.  In 2015, evaluated for change in bowel habits with worsening constipation, rectal bleeding.  Colonoscopy completed April 2015 with internal hemorrhoids, diverticulosis, single benign polyp removed.  Patient states she has been constipated as long as she can remember.  For the past 1 to 2 years symptoms have been worsening.  Rarely has a spontaneous BM.  Typically takes milk of magnesia several times per week.  Uses many fleets enemas and/or glycerin suppositories frequently in order to have a bowel movement.  Bristol 1 stools frequently.  In the past to use MiraLAX daily but felt like it made her go to go often.  She has not been on daily MiraLAX in quite some time. Feels like she has something in her rectum blocking passage of stool or that her opening is too narrow.  No melena or rectal bleeding.  Associated with her constipation, she has increased abdominal pain across the lower abdomen.  Sometimes feels more pain on the right lower quadrant region.  Feels bloated.  When she has increased constipation she feels the urge to urinate more regularly.  Patient had some throat symptoms earlier this year and states she was evaluated by ENT without any abnormalities found.  Denies heartburn or dysphagia.  No unintentional weight loss.  Notes that TSH low back in June, briefly discussed changing thyroid medication with PCP.  PCP wanted her to stay on the same dose as long as she  tolerated it but if not planned to decrease.  She was unable to follow-up with him, due to his death.  Sister had colon cancer in her 14s.  Rectocele repair 2009.  Current Outpatient Medications  Medication Sig Dispense Refill   aspirin EC 81 MG tablet Take 81 mg by mouth daily.     b complex vitamins capsule Take 1 capsule by mouth daily.     Cholecalciferol (VITAMIN D-3) 125 MCG (5000 UT) TABS Take 5,000 Units by mouth daily at 12 noon.      lisinopril (ZESTRIL) 10 MG tablet TAKE ONE (1) TABLET BY MOUTH EVERY DAY. TAKE WITH 5MG TO GET TOTAL OF 15MG 90 tablet 0   lisinopril (ZESTRIL) 5 MG tablet TAKE ONE (1) TABLET BY MOUTH EVERY DAY 90 tablet 0   Magnesium Hydroxide (MILK OF MAGNESIA PO) Take by mouth daily.     magnesium hydroxide (MILK OF MAGNESIA) 400 MG/5ML suspension Once a week     NP THYROID 90 MG tablet Take 1 tablet (90 mg total) by mouth daily. 90 tablet 1   No current facility-administered medications for this visit.    Allergies as of 04/04/2021 - Review Complete 04/04/2021  Allergen Reaction Noted   Codeine Nausea And Vomiting 03/21/2014   Crestor [rosuvastatin] Other (See Comments) 01/04/2017   Flexall [menthol (topical analgesic)] Rash 03/21/2014   Penicillins Rash 11/17/2012    Past Medical History:  Diagnosis Date   Allergy  Anemia    prior to hysterectomy   Back pain    comepensating for knee pain per pt   Cataract    right eye and immature   Complication of anesthesia    Constipation    takes Colace daily and Milk of Mag   CTS (carpal tunnel syndrome) 08/31/2016   Dizziness    but goes away real quick   Dry eyes    uses Eye Drops daily as needed   Female hypogonadism syndrome    GERD (gastroesophageal reflux disease)    History of colon polyps    Hyperlipidemia    was on meds but stopped taking back in June 2015   Hypertension    takes Lisinopril daily   Hypothyroidism    takes Synthroid daily   Joint pain    Obesity (BMI 30.0-34.9)     Peripheral arterial disease (HCC)    Pneumonia    as a baby   PONV (postoperative nausea and vomiting)    Primary localized osteoarthritis of left knee 04/17/2015   Primary localized osteoarthritis of right knee    knees   Urinary frequency    Urinary urgency     Past Surgical History:  Procedure Laterality Date   ABDOMINAL HYSTERECTOMY  90's   complete   APPENDECTOMY  80's   BACK SURGERY     CATARACT EXTRACTION W/PHACO Right 12/23/2015   Procedure: CATARACT EXTRACTION PHACO AND INTRAOCULAR LENS PLACEMENT RIGHT EYE CDE=6.42;  Surgeon: Williams Che, MD;  Location: [facility];  Service: Ophthalmology;  Laterality: Right;   COLONOSCOPY  09/09/2004   [id number] rectum and colon   COLONOSCOPY N/A 11/08/2013   Procedure: COLONOSCOPY;  Surgeon: Daneil Dolin, MD;  [facility];  Service: Endoscopy;  Laterality: N/A;  10:45   JOINT REPLACEMENT     2015, 2016   [place]   RECTOCELE REPAIR  10/2007   SPINE SURGERY     ruptured disc   TOTAL KNEE ARTHROPLASTY Right 04/04/2014   DR Noemi Chapel   TOTAL KNEE ARTHROPLASTY Right 04/04/2014   Procedure: RIGHT TOTAL KNEE ARTHROPLASTY;  Surgeon: Lorn Junes, MD;  Location: [facility];  Service: Orthopedics;  Laterality: Right;   TOTAL KNEE ARTHROPLASTY Left 04/29/2015   Procedure: TOTAL KNEE ARTHROPLASTY;  Surgeon: Elsie Saas, MD;  Location: [facility];  Service: Orthopedics;  Laterality: Left;   TUBAL LIGATION      Family History  Problem Relation Age of Onset   Colon cancer Sister        age 44s   Myasthenia gravis Sister    Bladder Cancer Brother    Lung cancer Brother        age 58   Alzheimer's disease Mother    COPD Father    Asthma Father    Alcohol abuse Brother        likely died of pneumonia    Social History   Socioeconomic History   Marital status: Widowed    Spouse name: Not on file   Number of children: 2   Years of education: 26   Highest education level: Not on file  Occupational History   Occupation:  retired    Comment: Doctor, general practice  Tobacco Use   Smoking status: Never   Smokeless tobacco: Never  Vaping Use   Vaping Use: Never used  Substance and Sexual Activity   Alcohol use: No   Drug use: No   Sexual activity: Never    Birth control/protection: Surgical  Other Topics Concern   Not on file  Social History Narrative   Husband died after over 62 years and then a year after husband died, she met a man who she had a relationship with for over ten years. He died in Oct 08, 2017 after long illness.   Live alone.    Has two children, grown sons.    Gets out with neices, enjoys going out to eat, going to [employer].   Goes to church.    Eats all food groups, eats some meat.       Social Determinants of Health   Financial Resource Strain: Not on file  Food Insecurity: Not on file  Transportation Needs: Not on file  Physical Activity: Not on file  Stress: Not on file  Social Connections: Not on file  Intimate Partner Violence: Not on file      ROS:  General: Negative for anorexia, weight loss, fever, chills, fatigue, weakness. Eyes: Negative for vision changes.  ENT: Negative for hoarseness, difficulty swallowing , nasal congestion. CV: Negative for chest pain, angina, palpitations, dyspnea on exertion, peripheral edema.  Respiratory: Negative for dyspnea at rest, dyspnea on exertion, cough, sputum, wheezing.  GI: See history of present illness. GU:  Negative for dysuria, hematuria, urinary incontinence, urinary frequency, nocturnal urination.  MS: Negative for joint pain, +low back pain.  Derm: Negative for rash or itching.  Neuro: Negative for weakness, abnormal sensation, seizure, frequent headaches, memory loss, confusion.  Psych: Negative for  depression, suicidal ideation, hallucinations. +anxiety/stress Endo: Negative for unusual weight change.  Heme: Negative for bruising or bleeding. Allergy: Negative for rash or hives.    Physical Examination:  BP 129/70    Temp (!) 97.3 F (36.3 C)   Ht '5\' 3"'  (1.6 m)   Wt 163 lb 6.4 oz (74.1 kg)   LMP 07/20/1990 (Approximate)   BMI 28.95 kg/m    General: Well-nourished, well-developed in no acute distress.  Head: Normocephalic, atraumatic.   Eyes: Conjunctiva pink, no icterus. Mouth: masked Neck: Supple without thyromegaly, masses, or lymphadenopathy.  Lungs: Clear to auscultation bilaterally.  Heart: Regular rate and rhythm, no murmurs rubs or gallops.  Abdomen: Bowel sounds are normal, nontender, nondistended, no hepatosplenomegaly or masses, no abdominal bruits or hernia , no rebound or guarding.  Full in lower abdomen. Rectal: external skin tag. No masses in rectum. Nontender exam. Brown stool heme negative. No impaction or anal stricture. Extremities: No lower extremity edema. No clubbing or deformities.  Neuro: Alert and oriented x 4 , grossly normal neurologically.  Skin: Warm and dry, no rash or jaundice.   Psych: Alert and cooperative, normal mood and affect.  Labs: Lab Results  Component Value Date   WBC 6.4 01/06/2021   HGB 13.7 01/06/2021   HCT 42.0 01/06/2021   MCV 86.8 01/06/2021   PLT 199 01/06/2021   Lab Results  Component Value Date   CREATININE 0.80 01/06/2021   BUN 18 01/06/2021   NA 141 01/06/2021   K 4.2 01/06/2021   CL 105 01/06/2021   CO2 29 01/06/2021   Lab Results  Component Value Date   ALT 12 01/06/2021   AST 20 01/06/2021   ALKPHOS 80 08/31/2016   BILITOT 0.4 01/06/2021   Lab Results  Component Value Date   TSH 0.03 (L) 01/06/2021     Imaging Studies: No results found.   Assessment:  Constipation: Chronic constipation but worsening over the past 1 to 2 years.  Patient complains of difficulty passing stool as  if she has a stricture or blockage distally.  Typically utilizes mini fleets glycerin suppository to stimulate bowel movement.  Uses milk of magnesia several times per week.  Stools are Bristol 1 and small.  No evidence of anal stricture or  rectal mass on exam today.  No impaction.  Stool heme-negative.  We discussed possibility of updating colonoscopy as hers was 80 years old, change in bowel habits, family history of colon cancer (sister in her 35s).  Initially patient would like to manage symptoms with medication and see how she does.  She is open to further work-up with ongoing symptoms.  Lower abdominal pain: Likely related to constipation.  If persistent after better management with bowel movements, would offer CT scan.  Plan:  Colon purge with 1 bottle of magnesium citrate today. Start MiraLAX 1 capful once to twice daily until having regular soft stools, then continue a half a capful to 1 capful daily to maintain regular bowel movements. If bowel movements do not improve, would consider updating colonoscopy. If abdominal pain does not improve with better bowel movements, would consider CT abdomen and pelvis with contrast. To call in 2 to 3 weeks with a progress report.  She will continue to keep a stool diary. Return to the office in 2 months for follow-up.

## 2021-04-24 ENCOUNTER — Encounter (INDEPENDENT_AMBULATORY_CARE_PROVIDER_SITE_OTHER): Payer: Medicare PPO | Admitting: Internal Medicine

## 2021-04-25 ENCOUNTER — Other Ambulatory Visit: Payer: Self-pay

## 2021-04-25 ENCOUNTER — Ambulatory Visit: Payer: Medicare PPO | Admitting: Internal Medicine

## 2021-04-25 ENCOUNTER — Encounter: Payer: Self-pay | Admitting: Internal Medicine

## 2021-04-25 VITALS — BP 127/77 | HR 66 | Temp 98.1°F | Resp 18 | Ht 63.0 in | Wt 163.1 lb

## 2021-04-25 DIAGNOSIS — H6123 Impacted cerumen, bilateral: Secondary | ICD-10-CM | POA: Diagnosis not present

## 2021-04-25 DIAGNOSIS — Z7689 Persons encountering health services in other specified circumstances: Secondary | ICD-10-CM

## 2021-04-25 DIAGNOSIS — E039 Hypothyroidism, unspecified: Secondary | ICD-10-CM

## 2021-04-25 DIAGNOSIS — E559 Vitamin D deficiency, unspecified: Secondary | ICD-10-CM

## 2021-04-25 DIAGNOSIS — I10 Essential (primary) hypertension: Secondary | ICD-10-CM

## 2021-04-25 DIAGNOSIS — Z0000 Encounter for general adult medical examination without abnormal findings: Secondary | ICD-10-CM

## 2021-04-25 DIAGNOSIS — E782 Mixed hyperlipidemia: Secondary | ICD-10-CM | POA: Diagnosis not present

## 2021-04-25 DIAGNOSIS — Z0001 Encounter for general adult medical examination with abnormal findings: Secondary | ICD-10-CM | POA: Diagnosis not present

## 2021-04-25 DIAGNOSIS — Z23 Encounter for immunization: Secondary | ICD-10-CM | POA: Diagnosis not present

## 2021-04-25 MED ORDER — LEVOTHYROXINE SODIUM 25 MCG PO TABS
25.0000 ug | ORAL_TABLET | Freq: Every day | ORAL | 0 refills | Status: DC
Start: 2021-04-25 — End: 2021-07-16

## 2021-04-25 NOTE — Assessment & Plan Note (Signed)
Advised to use Debrox eardrops Avoid using sharp objects for cleaning

## 2021-04-25 NOTE — Assessment & Plan Note (Signed)
BP Readings from Last 1 Encounters:  04/25/21 127/77   Well-controlled Counseled for compliance with the medications Advised DASH diet and moderate exercise/walking, at least 150 mins/week

## 2021-04-25 NOTE — Assessment & Plan Note (Signed)
Care established Previous chart reviewed History and medications reviewed with the patient 

## 2021-04-25 NOTE — Assessment & Plan Note (Signed)
Lipid profile reviewed Advised to follow low cholesterol diet for now

## 2021-04-25 NOTE — Progress Notes (Signed)
New Patient Office Visit  Subjective:  Patient ID: [name], female    DOB: 1940-09-02  Age: 80 y.o. MRN: [id number]  CC:  Chief Complaint  Patient presents with   New Patient (Initial Visit)    New patient was seeing dr Anastasio Champion pt is having earache with drainage down through throat and head hurting has seen ENT for this same problem     HPI [name] is a 80 year old female with PMH of HTN, hypothyroidism and vitamin D deficiency who presents for establishing care.  HTN: BP is well-controlled. Takes medications regularly. Patient denies dizziness, chest pain, dyspnea or palpitations.  Hypothyroidism: She has been taking NP thyroid 90 mg daily.  Her last TSH and free T4 showed oversupplemented thyroid hormone.  She still continued to take same dose of NP thyroid, has no dose adjustment was done at that time.  She reports jitteriness/anxiety and palpitations at times.  She denies any recent change in appetite or weight.  She was on Levothyroxine 75 mcg in the past (till 2019).  She reports having chronic excess earwax.  She has been having left-sided ear pain/fullness and throat irritation recently and attributes it to cold air exposure recently.  She denies any fever, chills, nausea or vomiting.  She had ENT specialist evaluation for throat pain and was told of no concerning signs.  She has had 3 doses of COVID-vaccine.  She received flu vaccine in the office today.    Past Medical History:  Diagnosis Date   Allergy    Anemia    prior to hysterectomy   Back pain    comepensating for knee pain per pt   Cataract    right eye and immature   Complication of anesthesia    Constipation    takes Colace daily and Milk of Mag   CTS (carpal tunnel syndrome) 08/31/2016   Dizziness    but goes away real quick   Dry eyes    uses Eye Drops daily as needed   Female hypogonadism syndrome    GERD (gastroesophageal reflux disease)    History of colon polyps    Hyperlipidemia     was on meds but stopped taking back in June 2015   Hypertension    takes Lisinopril daily   Hypothyroidism    takes Synthroid daily   Joint pain    Obesity (BMI 30.0-34.9)    Peripheral arterial disease (HCC)    Pneumonia    as a baby   PONV (postoperative nausea and vomiting)    Primary localized osteoarthritis of left knee 04/17/2015   Primary localized osteoarthritis of right knee    knees   Urinary frequency    Urinary urgency     Past Surgical History:  Procedure Laterality Date   ABDOMINAL HYSTERECTOMY  90's   complete   APPENDECTOMY  80's   BACK SURGERY     CATARACT EXTRACTION W/PHACO Right 12/23/2015   Procedure: CATARACT EXTRACTION PHACO AND INTRAOCULAR LENS PLACEMENT RIGHT EYE CDE=6.42;  Surgeon: Williams Che, MD;  Location: [facility];  Service: Ophthalmology;  Laterality: Right;   COLONOSCOPY  09/09/2004   [id number] rectum and colon   COLONOSCOPY N/A 11/08/2013   Procedure: COLONOSCOPY;  Surgeon: Daneil Dolin, MD;  [facility];  Service: Endoscopy;  Laterality: N/A;  10:45   JOINT REPLACEMENT     2015, 2016   [place] REPAIR  10/2007   SPINE SURGERY  ruptured disc   TOTAL KNEE ARTHROPLASTY Right 04/04/2014   DR Noemi Chapel   TOTAL KNEE ARTHROPLASTY Right 04/04/2014   Procedure: RIGHT TOTAL KNEE ARTHROPLASTY;  Surgeon: Lorn Junes, MD;  Location: [place];  Service: Orthopedics;  Laterality: Right;   TOTAL KNEE ARTHROPLASTY Left 04/29/2015   Procedure: TOTAL KNEE ARTHROPLASTY;  Surgeon: Elsie Saas, MD;  Location: [place];  Service: Orthopedics;  Laterality: Left;   TUBAL LIGATION      Family History  Problem Relation Age of Onset   Colon cancer Sister        age 42s   Myasthenia gravis Sister    Bladder Cancer Brother    Lung cancer Brother        age 64   Alzheimer's disease Mother    COPD Father    Asthma Father    Alcohol abuse Brother        likely died of pneumonia    Social History   Socioeconomic History    Marital status: Widowed    Spouse name: Not on file   Number of children: 2   Years of education: 30   Highest education level: Not on file  Occupational History   Occupation: retired    Comment: Doctor, general practice  Tobacco Use   Smoking status: Never   Smokeless tobacco: Never  Scientific laboratory technician Use: Never used  Substance and Sexual Activity   Alcohol use: No   Drug use: No   Sexual activity: Never    Birth control/protection: Surgical  Other Topics Concern   Not on file  Social History Narrative   Husband died after over 17 years and then a year after husband died, she met a man who she had a relationship with for over ten years. He died in September 22, 2017 after long illness.   Live alone.    Has two children, grown sons.    Gets out with neices, enjoys going out to eat, going to [employer].   Goes to church.    Eats all food groups, eats some meat.       Social Determinants of Health   Financial Resource Strain: Not on file  Food Insecurity: Not on file  Transportation Needs: Not on file  Physical Activity: Not on file  Stress: Not on file  Social Connections: Not on file  Intimate Partner Violence: Not on file    ROS Review of Systems  Constitutional:  Negative for chills and fever.  HENT:  Positive for sore throat. Negative for congestion, sinus pressure and sinus pain.   Eyes:  Negative for pain and discharge.  Respiratory:  Negative for cough and shortness of breath.   Cardiovascular:  Positive for palpitations. Negative for chest pain.  Gastrointestinal:  Negative for abdominal pain, diarrhea, nausea and vomiting.  Endocrine: Negative for polydipsia and polyuria.  Genitourinary:  Negative for dysuria and hematuria.  Musculoskeletal:  Negative for neck pain and neck stiffness.  Skin:  Negative for rash.  Neurological:  Negative for dizziness and weakness.  Psychiatric/Behavioral:  Negative for agitation and behavioral problems. The patient is nervous/anxious.     Objective:   Today's Vitals: BP 127/77 (BP Location: Left Arm, Patient Position: Sitting, Cuff Size: Normal)   Pulse 66   Temp 98.1 F (36.7 C) (Oral)   Resp 18   Ht '5\' 3"'  (1.6 m)   Wt 163 lb 1.3 oz (74 kg)   LMP 07/20/1990 (Approximate)   SpO2 96%   BMI 28.89 kg/m  Physical Exam Vitals reviewed.  Constitutional:      General: She is not in acute distress.    Appearance: She is not diaphoretic.  HENT:     Head: Normocephalic and atraumatic.     Nose: Nose normal.     Mouth/Throat:     Mouth: Mucous membranes are moist.  Eyes:     General: No scleral icterus.    Extraocular Movements: Extraocular movements intact.  Cardiovascular:     Rate and Rhythm: Normal rate and regular rhythm.     Pulses: Normal pulses.     Heart sounds: Normal heart sounds. No murmur heard. Pulmonary:     Breath sounds: Normal breath sounds. No wheezing or rales.  Abdominal:     Palpations: Abdomen is soft.     Tenderness: There is no abdominal tenderness.  Musculoskeletal:     Cervical back: Neck supple. No tenderness.     Right lower leg: No edema.     Left lower leg: No edema.  Skin:    General: Skin is warm.     Findings: No rash.  Neurological:     General: No focal deficit present.     Mental Status: She is alert and oriented to person, place, and time.  Psychiatric:        Mood and Affect: Mood normal.        Behavior: Behavior normal.    Assessment & Plan:   Problem List Items Addressed This Visit       Encounter to establish care - Primary   Care established Previous chart reviewed History and medications reviewed with the patient        Cardiovascular and Mediastinum   Hypertension    BP Readings from Last 1 Encounters:  04/25/21 127/77  Well-controlled Counseled for compliance with the medications Advised DASH diet and moderate exercise/walking, at least 150 mins/week         Endocrine   Hypothyroidism    Lab Results  Component Value Date   TSH 0.03  (L) 01/06/2021  On NP thyroid 90 mg daily Very low TSH and high free T4 -she reports jitteriness/anxiety, tremors and palpitations Will switch to Levothyroxine 25 mcg QD Check TSH and free T4 after 6 weeks      Relevant Medications   levothyroxine (SYNTHROID) 25 MCG tablet   Other Relevant Orders   TSH+T4F+T3Free     Nervous and Auditory   Bilateral impacted cerumen    Advised to use Debrox eardrops Avoid using sharp objects for cleaning        Other   Hyperlipidemia    Lipid profile reviewed Advised to follow low cholesterol diet for now      Vitamin D deficiency    Last vitamin D Lab Results  Component Value Date   VD25OH 73 01/06/2021  And advised to decrease vitamin D to 2000 IU daily            Other Visit Diagnoses     Need for immunization against influenza       Relevant Orders   Flu Vaccine QUAD High Dose(Fluad) (Completed)    Outpatient Encounter Medications as of 04/25/2021  Medication Sig   aspirin EC 81 MG tablet Take 81 mg by mouth daily.   b complex vitamins capsule Take 1 capsule by mouth daily.   Cholecalciferol (VITAMIN D-3) 125 MCG (5000 UT) TABS Take 5,000 Units by mouth daily at 12 noon.    levothyroxine (SYNTHROID) 25 MCG tablet Take 1 tablet (  25 mcg total) by mouth daily.   lisinopril (ZESTRIL) 10 MG tablet TAKE ONE (1) TABLET BY MOUTH EVERY DAY. TAKE WITH 5MG TO GET TOTAL OF 15MG   Magnesium Hydroxide (MILK OF MAGNESIA PO) Take by mouth daily.   magnesium hydroxide (MILK OF MAGNESIA) 400 MG/5ML suspension Once a week   [DISCONTINUED] NP THYROID 90 MG tablet Take 1 tablet (90 mg total) by mouth daily.   [DISCONTINUED] lisinopril (ZESTRIL) 5 MG tablet TAKE ONE (1) TABLET BY MOUTH EVERY DAY   [DISCONTINUED] polyethylene glycol powder (MIRALAX) 17 GM/SCOOP powder Take 17 g by mouth daily.   No facility-administered encounter medications on file as of 04/25/2021.    Follow-up: Return in about 2 months (around 06/25/2021) for Annual physical.    Lindell Spar, MD

## 2021-04-25 NOTE — Patient Instructions (Signed)
Please start taking Levothyroxine 25 mcg instead of NP thyroid.  Please use Debrox ear drops for ear wax.  Continue to take Lisinopril for HTN.  Please continue to follow low salt diet and ambulate as tolerated.  Please get fasting blood tests done before the next visit.

## 2021-04-25 NOTE — Assessment & Plan Note (Signed)
Last vitamin D Lab Results  Component Value Date   VD25OH 53 01/06/2021   And advised to decrease vitamin D to 2000 IU daily

## 2021-04-25 NOTE — Assessment & Plan Note (Signed)
Lab Results  Component Value Date   TSH 0.03 (L) 01/06/2021   On NP thyroid 90 mg daily Very low TSH and high free T4 -she reports jitteriness/anxiety, tremors and palpitations Will switch to Levothyroxine 25 mcg QD Check TSH and free T4 after 6 weeks

## 2021-05-14 ENCOUNTER — Other Ambulatory Visit: Payer: Self-pay | Admitting: Internal Medicine

## 2021-05-15 ENCOUNTER — Other Ambulatory Visit: Payer: Self-pay | Admitting: Internal Medicine

## 2021-05-15 DIAGNOSIS — I10 Essential (primary) hypertension: Secondary | ICD-10-CM

## 2021-05-15 MED ORDER — LISINOPRIL 10 MG PO TABS: 10.0000 mg | ORAL_TABLET | Freq: Every day | ORAL | 0 refills | Status: DC

## 2021-05-21 ENCOUNTER — Other Ambulatory Visit: Payer: Self-pay

## 2021-05-21 ENCOUNTER — Ambulatory Visit (INDEPENDENT_AMBULATORY_CARE_PROVIDER_SITE_OTHER): Payer: Medicare PPO

## 2021-05-21 DIAGNOSIS — Z0000 Encounter for general adult medical examination without abnormal findings: Secondary | ICD-10-CM | POA: Diagnosis not present

## 2021-05-21 NOTE — Progress Notes (Signed)
Subjective:   [name] is a 80 y.o. female who presents for Medicare Annual (Subsequent) preventive examination.  I connected with  [name] on 05/21/21 by a audio enabled telemedicine application and verified that I am speaking with the correct person using two identifiers.   I discussed the limitations, risks, security and privacy concerns of performing an evaluation and management service by telephone and the availability of in person appointments. I also discussed with the patient that there may be a patient responsible charge related to this service. The patient expressed understanding and verbally consented to this telephonic visit.   Review of Systems           Objective:    There were no vitals filed for this visit. There is no height or weight on file to calculate BMI.  Advanced Directives 11/29/2017 11/09/2016 12/23/2015 12/17/2015 04/18/2015 04/05/2014 03/23/2014  Does Patient Have a Medical Advance Directive? No Yes No Yes Yes Yes Yes  Type of Advance Directive - Information systems manager of [employer] Power of [place];Living will Healthcare Power of [place]  Does patient want to make changes to medical advance directive? - No - Patient declined - - No - Patient declined No - Patient declined -  Copy of [place] in Chart? - No - copy requested - Yes - No - copy requested Yes  Would patient like information on creating a medical advance directive? Yes (ED - Information included in AVS) - - - - - -    Current Medications (verified) Outpatient Encounter Medications as of 05/21/2021  Medication Sig   aspirin EC 81 MG tablet Take 81 mg by mouth daily.   b complex vitamins capsule Take 1 capsule by mouth daily.   Cholecalciferol (VITAMIN D-3) 125 MCG (5000 UT) TABS Take 5,000 Units by mouth daily at 12 noon.    levothyroxine (SYNTHROID) 25 MCG tablet Take 1 tablet (25 mcg total) by mouth daily.    lisinopril (ZESTRIL) 10 MG tablet Take 1 tablet (10 mg total) by mouth daily.   Magnesium Hydroxide (MILK OF MAGNESIA PO) Take by mouth daily.   magnesium hydroxide (MILK OF MAGNESIA) 400 MG/5ML suspension Once a week   No facility-administered encounter medications on file as of 05/21/2021.    Allergies (verified) Codeine, Crestor [rosuvastatin], Flexall [menthol (topical analgesic)], and Penicillins   History: Past Medical History:  Diagnosis Date   Allergy    Anemia    prior to hysterectomy   Back pain    comepensating for knee pain per pt   Cataract    right eye and immature   Complication of anesthesia    Constipation    takes Colace daily and Milk of Mag   CTS (carpal tunnel syndrome) 08/31/2016   Dizziness    but goes away real quick   Dry eyes    uses Eye Drops daily as needed   Female hypogonadism syndrome    GERD (gastroesophageal reflux disease)    History of colon polyps    Hyperlipidemia    was on meds but stopped taking back in June 2015   Hypertension    takes Lisinopril daily   Hypothyroidism    takes Synthroid daily   Joint pain    Obesity (BMI 30.0-34.9)    Peripheral arterial disease (HCC)    Pneumonia    as a baby   PONV (postoperative nausea and vomiting)    Primary localized  osteoarthritis of left knee 04/17/2015   Primary localized osteoarthritis of right knee    knees   Urinary frequency    Urinary urgency    Past Surgical History:  Procedure Laterality Date   ABDOMINAL HYSTERECTOMY  90's   complete   APPENDECTOMY  80's   BACK SURGERY     CATARACT EXTRACTION W/PHACO Right 12/23/2015   Procedure: CATARACT EXTRACTION PHACO AND INTRAOCULAR LENS PLACEMENT RIGHT EYE CDE=6.42;  Surgeon: Williams Che, MD;  Location: [facility];  Service: Ophthalmology;  Laterality: Right;   COLONOSCOPY  09/09/2004   [id number] rectum and colon   COLONOSCOPY N/A 11/08/2013   Procedure: COLONOSCOPY;  Surgeon: Daneil Dolin, MD;  [facility];   Service: Endoscopy;  Laterality: N/A;  10:45   JOINT REPLACEMENT     Sep 30, 2013, 09-30-2014   [place]   RECTOCELE REPAIR  10/2007   SPINE SURGERY     ruptured disc   TOTAL KNEE ARTHROPLASTY Right 04/04/2014   DR Noemi Chapel   TOTAL KNEE ARTHROPLASTY Right 04/04/2014   Procedure: RIGHT TOTAL KNEE ARTHROPLASTY;  Surgeon: Lorn Junes, MD;  Location: [place];  Service: Orthopedics;  Laterality: Right;   TOTAL KNEE ARTHROPLASTY Left 04/29/2015   Procedure: TOTAL KNEE ARTHROPLASTY;  Surgeon: Elsie Saas, MD;  Location: [place];  Service: Orthopedics;  Laterality: Left;   TUBAL LIGATION     Family History  Problem Relation Age of Onset   Colon cancer Sister        age 28s   Myasthenia gravis Sister    Bladder Cancer Brother    Lung cancer Brother        age 45   Alzheimer's disease Mother    COPD Father    Asthma Father    Alcohol abuse Brother        likely died of pneumonia   Social History   Socioeconomic History   Marital status: Widowed    Spouse name: Not on file   Number of children: 2   Years of education: 30   Highest education level: Not on file  Occupational History   Occupation: retired    Comment: Doctor, general practice  Tobacco Use   Smoking status: Never   Smokeless tobacco: Never  Scientific laboratory technician Use: Never used  Substance and Sexual Activity   Alcohol use: No   Drug use: No   Sexual activity: Never    Birth control/protection: Surgical  Other Topics Concern   Not on file  Social History Narrative   Husband died after over 32 years and then a year after husband died, she met a man who she had a relationship with for over ten years. He died in 09/30/2017 after long illness.   Live alone.    Has two children, grown sons.    Gets out with neices, enjoys going out to eat, going to [employer].   Goes to church.    Eats all food groups, eats some meat.       Social Determinants of Health   Financial Resource Strain: Not on file  Food Insecurity: Not on file   Transportation Needs: Not on file  Physical Activity: Not on file  Stress: Not on file  Social Connections: Not on file    Tobacco Counseling Counseling given: Not Answered   Clinical Intake:                 Diabetic?No  Activities of Daily Living In your present state of health, do you have any difficulty performing the following activities: 04/25/2021  Hearing? N  Vision? N  Difficulty concentrating or making decisions? N  Walking or climbing stairs? N  Dressing or bathing? N  Doing errands, shopping? N  Some recent data might be hidden    Patient Care Team: Lindell Spar, MD as PCP - General (Internal Medicine) Herminio Commons, MD (Inactive) as PCP - Cardiology (Cardiology) Gala Romney Cristopher Estimable, MD as Consulting Physician (Gastroenterology)  Indicate any recent Medical Services you may have received from other than [facility] providers in the past year (date may be approximate).     Assessment:   This is a routine wellness examination for [place].  Hearing/Vision screen No results found.  Dietary issues and exercise activities discussed:     Goals Addressed   None   Depression Screen PHQ 2/9 Scores 04/25/2021 07/04/2019 02/10/2018 12/30/2017 11/29/2017 11/09/2016 08/31/2016  PHQ - 2 Score 0 0 0 2 6 0 0  PHQ- 9 Score - - '1 4 10 ' - -    Fall Risk Fall Risk  04/25/2021 02/10/2018 12/30/2017 11/29/2017 10/01/2017  Falls in the past year? 0 No No No No  Number falls in past yr: 0 - - - -  Injury with Fall? 0 - - - -  Risk for fall due to : No Fall Risks - - - -  Follow up Falls evaluation completed - - - -    FALL RISK PREVENTION PERTAINING TO THE HOME:  Any stairs in or around the home? Yes  If so, are there any without handrails? Yes  Home free of loose throw rugs in walkways, pet beds, electrical cords, etc? Yes  Adequate lighting in your home to reduce risk of falls? Yes   ASSISTIVE DEVICES UTILIZED TO PREVENT FALLS:  Life alert? No  Use of  a cane, walker or w/c? No  Grab bars in the bathroom? Yes  Shower chair or bench in shower? Yes  Elevated toilet seat or a handicapped toilet? Yes   TIMED UP AND GO:  Was the test performed? No .  Length of time to ambulate 10 feet: n/a sec.     Cognitive Function: MMSE - Mini Mental State Exam 11/09/2016  Orientation to time 5  Orientation to Place 5  Registration 3  Attention/ Calculation 5  Recall 3  Language- name 2 objects 2  Language- repeat 1  Language- follow 3 step command 3  Language- read & follow direction 1  Write a sentence 1  Copy design 1  Total score 30     6CIT Screen 11/29/2017  What Year? 0 points  What month? 0 points  What time? 0 points  Count back from 20 0 points  Months in reverse 0 points  Repeat phrase 0 points  Total Score 0    Immunizations Immunization History  Administered Date(s) Administered   Fluad Quad(high Dose 65+) 05/21/2020, 04/25/2021   Influenza, High Dose Seasonal PF 04/20/2016   Influenza,inj,Quad PF,6+ Mos 07/02/2017, 05/04/2019   Moderna Sars-Covid-2 Vaccination 09/13/2019, 10/11/2019, 05/14/2020   Pneumococcal Conjugate-13 09/28/2016   Pneumococcal Polysaccharide-23 10/01/2017   Tdap 12/30/2017    TDAP status: Up to date  Flu Vaccine status: Up to date  Pneumococcal vaccine status: Up to date  Covid-19 vaccine status: Completed vaccines  Qualifies for Shingles Vaccine? Yes   Zostavax completed No   Shingrix Completed?: No.    Education has been provided regarding  the importance of this vaccine. Patient has been advised to call insurance company to determine out of pocket expense if they have not yet received this vaccine. Advised may also receive vaccine at local pharmacy or Health Dept. Verbalized acceptance and understanding.  Screening Tests Health Maintenance  Topic Date Due   Zoster Vaccines- Shingrix (1 of 2) Never done   COVID-19 Vaccine (4 - Booster for Moderna series) 07/09/2020   TETANUS/TDAP   12/31/2027   Pneumonia Vaccine 71+ Years old  Completed   INFLUENZA VACCINE  Completed   DEXA SCAN  Completed   Hepatitis C Screening  Completed   HPV VACCINES  Aged Out    Health Maintenance  Health Maintenance Due  Topic Date Due   Zoster Vaccines- Shingrix (1 of 2) Never done   COVID-19 Vaccine (4 - Booster for Moderna series) 07/09/2020    Colorectal cancer screening: No longer required.   Mammogram status: No longer required due to age.  Bone Density status: Completed 10/01/2017. Results reflect: Bone density results: NORMAL. Repeat every 5 years.  Lung Cancer Screening: (Low Dose CT Chest recommended if Age 48-80 years, 30 pack-year currently smoking OR have quit w/in 15years.) does not qualify.   Lung Cancer Screening Referral: n/a  Additional Screening:  Hepatitis C Screening: does qualify; Completed 09/09/2020  Vision Screening: Recommended annual ophthalmology exams for early detection of glaucoma and other disorders of the eye. Is the patient up to date with their annual eye exam?  Yes  Who is the provider or what is the name of the office in which the patient attends annual eye exams? My eye doctor Linna Hoff  If pt is not established with a provider, would they like to be referred to a provider to establish care? No .   Dental Screening: Recommended annual dental exams for proper oral hygiene  Community Resource Referral / Chronic Care Management: CRR required this visit?  No   CCM required this visit?  No      Plan:     I have personally reviewed and noted the following in the patient's chart:   Medical and social history Use of alcohol, tobacco or illicit drugs  Current medications and supplements including opioid prescriptions.  Functional ability and status Nutritional status Physical activity Advanced directives List of other physicians Hospitalizations, surgeries, and ER visits in previous 12 months Vitals Screenings to include cognitive,  depression, and falls Referrals and appointments  In addition, I have reviewed and discussed with patient certain preventive protocols, quality metrics, and best practice recommendations. A written personalized care plan for preventive services as well as general preventive health recommendations were provided to patient.     Quentin Angst, [place]   05/21/2021   Nurse Notes: This is a tele health visit with the patient at home. The provider is in the office and is [employer]

## 2021-05-21 NOTE — Patient Instructions (Signed)
[name] , Thank you for taking time to come for your Medicare Wellness Visit. I appreciate your ongoing commitment to your health goals. Please review the following plan we discussed and let me know if I can assist you in the future.   Screening recommendations/referrals: Colonoscopy: Not due Mammogram: not due  Bone Density: complete Recommended yearly ophthalmology/optometry visit for glaucoma screening and checkup Recommended yearly dental visit for hygiene and checkup  Vaccinations: Influenza vaccine: complete Pneumococcal vaccine: complete Tdap vaccine: complete Shingles vaccine: due now    Advanced directives: yes  Conditions/risks identified: hypertension  Next appointment: 1 year    Preventive Care 17 Years and Older, Female Preventive care refers to lifestyle choices and visits with your health care provider that can promote health and wellness. What does preventive care include? A yearly physical exam. This is also called an annual well check. Dental exams once or twice a year. Routine eye exams. Ask your health care provider how often you should have your eyes checked. Personal lifestyle choices, including: Daily care of your teeth and gums. Regular physical activity. Eating a healthy diet. Avoiding tobacco and drug use. Limiting alcohol use. Practicing safe sex. Taking low-dose aspirin every day. Taking vitamin and mineral supplements as recommended by your health care provider. What happens during an annual well check? The services and screenings done by your health care provider during your annual well check will depend on your age, overall health, lifestyle risk factors, and family history of disease. Counseling  Your health care provider may ask you questions about your: Alcohol use. Tobacco use. Drug use. Emotional well-being. Home and relationship well-being. Sexual activity. Eating habits. History of falls. Memory and ability to understand  (cognition). Work and work Statistician. Reproductive health. Screening  You may have the following tests or measurements: Height, weight, and BMI. Blood pressure. Lipid and cholesterol levels. These may be checked every 5 years, or more frequently if you are over 36 years old. Skin check. Lung cancer screening. You may have this screening every year starting at age 16 if you have a 30-pack-year history of smoking and currently smoke or have quit within the past 15 years. Fecal occult blood test (FOBT) of the stool. You may have this test every year starting at age 67. Flexible sigmoidoscopy or colonoscopy. You may have a sigmoidoscopy every 5 years or a colonoscopy every 10 years starting at age 68. Hepatitis C blood test. Hepatitis B blood test. Sexually transmitted disease (STD) testing. Diabetes screening. This is done by checking your blood sugar (glucose) after you have not eaten for a while (fasting). You may have this done every 1-3 years. Bone density scan. This is done to screen for osteoporosis. You may have this done starting at age 71. Mammogram. This may be done every 1-2 years. Talk to your health care provider about how often you should have regular mammograms. Talk with your health care provider about your test results, treatment options, and if necessary, the need for more tests. Vaccines  Your health care provider may recommend certain vaccines, such as: Influenza vaccine. This is recommended every year. Tetanus, diphtheria, and acellular pertussis (Tdap, Td) vaccine. You may need a Td booster every 10 years. Zoster vaccine. You may need this after age 95. Pneumococcal 13-valent conjugate (PCV13) vaccine. One dose is recommended after age 18. Pneumococcal polysaccharide (PPSV23) vaccine. One dose is recommended after age 80. Talk to your health care provider about which screenings and vaccines you need and how often you  need them. This information is not intended to  replace advice given to you by your health care provider. Make sure you discuss any questions you have with your health care provider. Document Released: 08/02/2015 Document Revised: 03/25/2016 Document Reviewed: 05/07/2015 Elsevier Interactive Patient Education  2017 [place] Prevention in the Home Falls can cause injuries. They can happen to people of all ages. There are many things you can do to make your home safe and to help prevent falls. What can I do on the outside of my home? Regularly fix the edges of walkways and driveways and fix any cracks. Remove anything that might make you trip as you walk through a door, such as a raised step or threshold. Trim any bushes or trees on the path to your home. Use bright outdoor lighting. Clear any walking paths of anything that might make someone trip, such as rocks or tools. Regularly check to see if handrails are loose or broken. Make sure that both sides of any steps have handrails. Any raised decks and porches should have guardrails on the edges. Have any leaves, snow, or ice cleared regularly. Use sand or salt on walking paths during winter. Clean up any spills in your garage right away. This includes oil or grease spills. What can I do in the bathroom? Use night lights. Install grab bars by the toilet and in the tub and shower. Do not use towel bars as grab bars. Use non-skid mats or decals in the tub or shower. If you need to sit down in the shower, use a plastic, non-slip stool. Keep the floor dry. Clean up any water that spills on the floor as soon as it happens. Remove soap buildup in the tub or shower regularly. Attach bath mats securely with double-sided non-slip rug tape. Do not have throw rugs and other things on the floor that can make you trip. What can I do in the bedroom? Use night lights. Make sure that you have a light by your bed that is easy to reach. Do not use any sheets or blankets that are too big for  your bed. They should not hang down onto the floor. Have a firm chair that has side arms. You can use this for support while you get dressed. Do not have throw rugs and other things on the floor that can make you trip. What can I do in the kitchen? Clean up any spills right away. Avoid walking on wet floors. Keep items that you use a lot in easy-to-reach places. If you need to reach something above you, use a strong step stool that has a grab bar. Keep electrical cords out of the way. Do not use floor polish or wax that makes floors slippery. If you must use wax, use non-skid floor wax. Do not have throw rugs and other things on the floor that can make you trip. What can I do with my stairs? Do not leave any items on the stairs. Make sure that there are handrails on both sides of the stairs and use them. Fix handrails that are broken or loose. Make sure that handrails are as long as the stairways. Check any carpeting to make sure that it is firmly attached to the stairs. Fix any carpet that is loose or worn. Avoid having throw rugs at the top or bottom of the stairs. If you do have throw rugs, attach them to the floor with carpet tape. Make sure that you have a light switch  at the top of the stairs and the bottom of the stairs. If you do not have them, ask someone to add them for you. What else can I do to help prevent falls? Wear shoes that: Do not have high heels. Have rubber bottoms. Are comfortable and fit you well. Are closed at the toe. Do not wear sandals. If you use a stepladder: Make sure that it is fully opened. Do not climb a closed stepladder. Make sure that both sides of the stepladder are locked into place. Ask someone to hold it for you, if possible. Clearly mark and make sure that you can see: Any grab bars or handrails. First and last steps. Where the edge of each step is. Use tools that help you move around (mobility aids) if they are needed. These  include: Canes. Walkers. Scooters. Crutches. Turn on the lights when you go into a dark area. Replace any light bulbs as soon as they burn out. Set up your furniture so you have a clear path. Avoid moving your furniture around. If any of your floors are uneven, fix them. If there are any pets around you, be aware of where they are. Review your medicines with your doctor. Some medicines can make you feel dizzy. This can increase your chance of falling. Ask your doctor what other things that you can do to help prevent falls. This information is not intended to replace advice given to you by your health care provider. Make sure you discuss any questions you have with your health care provider. Document Released: 05/02/2009 Document Revised: 12/12/2015 Document Reviewed: 08/10/2014 Elsevier Interactive Patient Education  2017 [employer].

## 2021-05-27 ENCOUNTER — Other Ambulatory Visit: Payer: Self-pay

## 2021-05-27 ENCOUNTER — Ambulatory Visit: Payer: Medicare PPO | Admitting: Internal Medicine

## 2021-05-27 ENCOUNTER — Telehealth: Payer: Self-pay

## 2021-05-27 ENCOUNTER — Encounter: Payer: Self-pay | Admitting: Internal Medicine

## 2021-05-27 VITALS — BP 126/75 | HR 56 | Temp 97.1°F | Ht 63.0 in | Wt 169.0 lb

## 2021-05-27 DIAGNOSIS — R198 Other specified symptoms and signs involving the digestive system and abdomen: Secondary | ICD-10-CM

## 2021-05-27 DIAGNOSIS — K625 Hemorrhage of anus and rectum: Secondary | ICD-10-CM | POA: Diagnosis not present

## 2021-05-27 DIAGNOSIS — K5909 Other constipation: Secondary | ICD-10-CM | POA: Diagnosis not present

## 2021-05-27 MED ORDER — PEG 3350-KCL-NA BICARB-NACL 420 G PO SOLR: 4000.0000 mL | ORAL | 0 refills | Status: DC

## 2021-05-27 NOTE — Progress Notes (Signed)
Primary Care Physician:  Lindell Spar, MD Primary Gastroenterologist:  Dr. Gala Romney  Pre-Procedure History & Physical: HPI:  [name] is a 80 y.o. female here for here to discuss change in bowel habits.  Lifelong tendency towards constipation.  Insidiously worse over the past year or so.  Takes milk of magnesia on a regular basis.  Uses an enema routinely to have a bowel movement a couple of times weekly.  Occasional bright red blood per rectum with straining.  Positive family history colon cancer in her sister but she developed the disease at advanced age.  Prior rectocele repair. She is not taking any fiber supplementation.  Colonoscopy in 2015  -Long redundant colon.  Internal hemorrhoids and diverticulosis.  Single diminutive polyp-path benign.  Past Medical History:  Diagnosis Date   Allergy    Anemia    prior to hysterectomy   Back pain    comepensating for knee pain per pt   Cataract    right eye and immature   Complication of anesthesia    Constipation    takes Colace daily and Milk of Mag   CTS (carpal tunnel syndrome) 08/31/2016   Dizziness    but goes away real quick   Dry eyes    uses Eye Drops daily as needed   Female hypogonadism syndrome    GERD (gastroesophageal reflux disease)    History of colon polyps    Hyperlipidemia    was on meds but stopped taking back in June 2015   Hypertension    takes Lisinopril daily   Hypothyroidism    takes Synthroid daily   Joint pain    Obesity (BMI 30.0-34.9)    Peripheral arterial disease (HCC)    Pneumonia    as a baby   PONV (postoperative nausea and vomiting)    Primary localized osteoarthritis of left knee 04/17/2015   Primary localized osteoarthritis of right knee    knees   Urinary frequency    Urinary urgency     Past Surgical History:  Procedure Laterality Date   ABDOMINAL HYSTERECTOMY  90's   complete   APPENDECTOMY  80's   BACK SURGERY     CATARACT EXTRACTION W/PHACO Right 12/23/2015    Procedure: CATARACT EXTRACTION PHACO AND INTRAOCULAR LENS PLACEMENT RIGHT EYE CDE=6.42;  Surgeon: Williams Che, MD;  Location: [facility];  Service: Ophthalmology;  Laterality: Right;   COLONOSCOPY  09/09/2004   GUR:KYHCWC rectum and colon   COLONOSCOPY N/A 11/08/2013   Procedure: COLONOSCOPY;  Surgeon: Daneil Dolin, MD;  Location: [facility];  Service: Endoscopy;  Laterality: N/A;  10:45   JOINT REPLACEMENT     2015, 2016   [place]   RECTOCELE REPAIR  10/2007   SPINE SURGERY     ruptured disc   TOTAL KNEE ARTHROPLASTY Right 04/04/2014   DR Noemi Chapel   TOTAL KNEE ARTHROPLASTY Right 04/04/2014   Procedure: RIGHT TOTAL KNEE ARTHROPLASTY;  Surgeon: Lorn Junes, MD;  Location: [place];  Service: Orthopedics;  Laterality: Right;   TOTAL KNEE ARTHROPLASTY Left 04/29/2015   Procedure: TOTAL KNEE ARTHROPLASTY;  Surgeon: Elsie Saas, MD;  Location: [place];  Service: Orthopedics;  Laterality: Left;   TUBAL LIGATION      Prior to Admission medications   Medication Sig Start Date End Date Taking? Authorizing Provider  aspirin EC 81 MG tablet Take 81 mg by mouth daily.   Yes [provider]  b complex vitamins capsule Take  1 capsule by mouth daily.   Yes [provider]  Cholecalciferol (VITAMIN D-3) 125 MCG (5000 UT) TABS Take 5,000 Units by mouth daily at 12 noon.    Yes [provider]  levothyroxine (SYNTHROID) 25 MCG tablet Take 1 tablet (25 mcg total) by mouth daily. 04/25/21  Yes Lindell Spar, MD  lisinopril (ZESTRIL) 10 MG tablet Take 1 tablet (10 mg total) by mouth daily. 05/15/21  Yes Lindell Spar, MD  Magnesium Hydroxide (MILK OF MAGNESIA PO) Take by mouth daily. Tablets as needed   Yes [provider]  magnesium hydroxide (MILK OF MAGNESIA) 400 MG/5ML suspension Every 3 days   Yes [provider]    Allergies as of 05/27/2021 - Review Complete 05/27/2021  Allergen Reaction Noted   Codeine Nausea And Vomiting 03/21/2014    Crestor [rosuvastatin] Other (See Comments) 01/04/2017   Flexall [menthol (topical analgesic)] Rash 03/21/2014   Penicillins Rash 11/17/2012    Family History  Problem Relation Age of Onset   Colon cancer Sister        age 32s   Myasthenia gravis Sister    Bladder Cancer Brother    Lung cancer Brother        age 32   Alzheimer's disease Mother    COPD Father    Asthma Father    Alcohol abuse Brother        likely died of pneumonia    Social History   Socioeconomic History   Marital status: Widowed    Spouse name: Not on file   Number of children: 2   Years of education: 59   Highest education level: Not on file  Occupational History   Occupation: retired    Comment: Doctor, general practice  Tobacco Use   Smoking status: Never   Smokeless tobacco: Never  Scientific laboratory technician Use: Never used  Substance and Sexual Activity   Alcohol use: No   Drug use: No   Sexual activity: Never    Birth control/protection: Surgical  Other Topics Concern   Not on file  Social History Narrative   Husband died after over 53 years and then a year after husband died, she met a man who she had a relationship with for over ten years. He died in 2017-10-07 after long illness.   Live alone.    Has two children, grown sons.    Gets out with neices, enjoys going out to eat, going to [employer].   Goes to church.    Eats all food groups, eats some meat.       Social Determinants of Health   Financial Resource Strain: Low Risk    Difficulty of Paying Living Expenses: Not hard at all  Food Insecurity: No Food Insecurity   Worried About Charity fundraiser in the Last Year: Never true   [place] in the Last Year: Never true  Transportation Needs: No Transportation Needs   Lack of Transportation (Medical): No   Lack of Transportation (Non-Medical): No  Physical Activity: Inactive   Days of Exercise per Week: 0 days   Minutes of Exercise per Session: 0 min  Stress: No Stress Concern  Present   Feeling of Stress : Only a little  Social Connections: Socially Isolated   Frequency of Communication with Friends and Family: More than three times a week   Frequency of Social Gatherings with Friends and Family: Three times a week   Attends Religious Services: Never  Active Member of Clubs or Organizations: No   Attends Archivist Meetings: Never   Marital Status: Widowed  Human resources officer Violence: Not At Risk   Fear of Current or Ex-Partner: No   Emotionally Abused: No   Physically Abused: No   Sexually Abused: No    Review of Systems: See HPI, otherwise negative ROS  Physical Exam: BP 126/75   Pulse (!) 56   Temp (!) 97.1 F (36.2 C)   Ht '5\' 3"'  (1.6 m)   Wt 169 lb (76.7 kg)   LMP 07/20/1990 (Approximate)   BMI 29.94 kg/m  General:   Alert,   well-nourished, pleasant and cooperative in NAD Neck:  Supple; no masses or thyromegaly. No significant cervical adenopathy. Lungs:  Clear throughout to auscultation.   No wheezes, crackles, or rhonchi. No acute distress. Heart:  Regular rate and rhythm; no murmurs, clicks, rubs,  or gallops. Abdomen: Non-distended, normal bowel sounds.  Soft and nontender without appreciable mass or hepatosplenomegaly.  Pulses:  Normal pulses noted. Extremities:  Without clubbing or edema.  Impression/Plan: 80 year old lady in overall remarkably good health presents with complaint of chronic constipation and says she worsened recently with intermittent blood per rectum with straining.  Known hemorrhoids.  Requires milk of magnesia and periodic enemas to facilitate bowel movement on the order of 2 times weekly.  Last colonoscopy findings as outlined above.  Family history of colon cancer in first-degree relative but at advanced age. Current bowel regimen is optimal, particularly with regular use of enemas. We ought to go ahead and update a colonoscopy given its been nearly 8 years since her last examination and her insidiously  progressing symptoms.  Recommendations:                                                               I have offered the patient a diagnostic colonoscopy.  The risks, benefits, limitations, alternatives and imponderables have been reviewed with the patient. Questions have been answered. She is agreable.                                 We will proceed with a diagnostic colonoscopy (change in bowel habits, occasional blood per rectum and positive family history of colon cancer) in the near future.  ASA 2/propofol  We will make further recommendations regarding a more optimal bowel regimen after colonoscopy has been performed.      Notice: This dictation was prepared with Dragon dictation along with smaller phrase technology. Any transcriptional errors that result from this process are unintentional and may not be corrected upon review.

## 2021-05-27 NOTE — Telephone Encounter (Signed)
PA for TCS submitted via [employer] website. [employer]# [id number], valid 06/30/21-07/30/21.

## 2021-05-27 NOTE — Patient Instructions (Signed)
It was good to see you again today!  As discussed, we will proceed with a diagnostic colonoscopy (change in bowel habits, occasional blood per rectum and positive family history of colon cancer) in the near future.  ASA 2/propofol  We will make further recommendations regarding his successful bowel regimen after colonoscopy has been performed.

## 2021-06-30 ENCOUNTER — Encounter (HOSPITAL_COMMUNITY): Payer: Self-pay | Admitting: Internal Medicine

## 2021-06-30 ENCOUNTER — Other Ambulatory Visit: Payer: Self-pay

## 2021-06-30 ENCOUNTER — Ambulatory Visit (HOSPITAL_COMMUNITY): Payer: Medicare PPO | Admitting: Anesthesiology

## 2021-06-30 ENCOUNTER — Ambulatory Visit (HOSPITAL_COMMUNITY)
Admission: RE | Admit: 2021-06-30 | Discharge: 2021-06-30 | Disposition: A | Discharge status: Home / Self Care | Payer: Medicare PPO | Attending: Internal Medicine | Admitting: Internal Medicine

## 2021-06-30 ENCOUNTER — Encounter (HOSPITAL_COMMUNITY)
Admission: RE | Disposition: A | Discharge status: Home / Self Care | Payer: Self-pay | Source: Home / Self Care | Attending: Internal Medicine

## 2021-06-30 DIAGNOSIS — I739 Peripheral vascular disease, unspecified: Secondary | ICD-10-CM | POA: Diagnosis not present

## 2021-06-30 DIAGNOSIS — K219 Gastro-esophageal reflux disease without esophagitis: Secondary | ICD-10-CM | POA: Insufficient documentation

## 2021-06-30 DIAGNOSIS — E039 Hypothyroidism, unspecified: Secondary | ICD-10-CM | POA: Insufficient documentation

## 2021-06-30 DIAGNOSIS — D649 Anemia, unspecified: Secondary | ICD-10-CM | POA: Insufficient documentation

## 2021-06-30 DIAGNOSIS — I10 Essential (primary) hypertension: Secondary | ICD-10-CM | POA: Diagnosis not present

## 2021-06-30 DIAGNOSIS — G709 Myoneural disorder, unspecified: Secondary | ICD-10-CM | POA: Insufficient documentation

## 2021-06-30 DIAGNOSIS — K5730 Diverticulosis of large intestine without perforation or abscess without bleeding: Secondary | ICD-10-CM | POA: Diagnosis not present

## 2021-06-30 DIAGNOSIS — K921 Melena: Secondary | ICD-10-CM | POA: Diagnosis not present

## 2021-06-30 DIAGNOSIS — K640 First degree hemorrhoids: Secondary | ICD-10-CM | POA: Diagnosis not present

## 2021-06-30 DIAGNOSIS — M1990 Unspecified osteoarthritis, unspecified site: Secondary | ICD-10-CM | POA: Diagnosis not present

## 2021-06-30 DIAGNOSIS — Z9189 Other specified personal risk factors, not elsewhere classified: Secondary | ICD-10-CM | POA: Diagnosis not present

## 2021-06-30 HISTORY — PX: COLONOSCOPY WITH PROPOFOL: SHX5780

## 2021-06-30 SURGERY — COLONOSCOPY WITH PROPOFOL
Anesthesia: General

## 2021-06-30 MED ORDER — PROPOFOL 500 MG/50ML IV EMUL
INTRAVENOUS | Status: DC | PRN
  Administered 2021-06-30: 150 ug/kg/min via INTRAVENOUS

## 2021-06-30 MED ORDER — STERILE WATER FOR IRRIGATION IR SOLN
Status: DC | PRN
  Administered 2021-06-30: 40 mL

## 2021-06-30 MED ORDER — LIDOCAINE HCL (CARDIAC) PF 100 MG/5ML IV SOSY
PREFILLED_SYRINGE | INTRAVENOUS | Status: DC | PRN
  Administered 2021-06-30: 50 mg via INTRATRACHEAL

## 2021-06-30 MED ORDER — LACTATED RINGERS IV SOLN: INTRAVENOUS | Status: DC

## 2021-06-30 MED ORDER — EPHEDRINE SULFATE 50 MG/ML IJ SOLN
INTRAMUSCULAR | Status: DC | PRN
  Administered 2021-06-30: 10 mg via INTRAVENOUS

## 2021-06-30 MED ORDER — PROPOFOL 10 MG/ML IV BOLUS
INTRAVENOUS | Status: DC | PRN
  Administered 2021-06-30: 100 mg via INTRAVENOUS

## 2021-06-30 NOTE — Discharge Instructions (Signed)
  Colonoscopy Discharge Instructions  Read the instructions outlined below and refer to this sheet in the next few weeks. These discharge instructions provide you with general information on caring for yourself after you leave the hospital. Your doctor may also give you specific instructions. While your treatment has been planned according to the most current medical practices available, unavoidable complications occasionally occur. If you have any problems or questions after discharge, call Dr. Gala Romney at [phone]. ACTIVITY You may resume your regular activity, but move at a slower pace for the next 24 hours.  Take frequent rest periods for the next 24 hours.  Walking will help get rid of the air and reduce the bloated feeling in your belly (abdomen).  No driving for 24 hours (because of the medicine (anesthesia) used during the test).   Do not sign any important legal documents or operate any machinery for 24 hours (because of the anesthesia used during the test).  NUTRITION Drink plenty of fluids.  You may resume your normal diet as instructed by your doctor.  Begin with a light meal and progress to your normal diet. Heavy or fried foods are harder to digest and may make you feel sick to your stomach (nauseated).  Avoid alcoholic beverages for 24 hours or as instructed.  MEDICATIONS You may resume your normal medications unless your doctor tells you otherwise.  WHAT YOU CAN EXPECT TODAY Some feelings of bloating in the abdomen.  Passage of more gas than usual.  Spotting of blood in your stool or on the toilet paper.  IF YOU HAD POLYPS REMOVED DURING THE COLONOSCOPY: No aspirin products for 7 days or as instructed.  No alcohol for 7 days or as instructed.  Eat a soft diet for the next 24 hours.  FINDING OUT THE RESULTS OF YOUR TEST Not all test results are available during your visit. If your test results are not back during the visit, make an appointment with your caregiver to find out the  results. Do not assume everything is normal if you have not heard from your caregiver or the medical facility. It is important for you to follow up on all of your test results.  SEEK IMMEDIATE MEDICAL ATTENTION IF: You have more than a spotting of blood in your stool.  Your belly is swollen (abdominal distention).  You are nauseated or vomiting.  You have a temperature over 101.  You have abdominal pain or discomfort that is severe or gets worse throughout the day.   Diverticulosis and hemorrhoid information provided  Likely bled from hemorrhoids.  No polyps or any sign of colon cancer  Continue taking MiraLAX 17 g nightly as needed to have at least 3 bowel movements weekly  A future colonoscopy is not recommended unless new problems develop  Office visit with [place] in 6 months.

## 2021-06-30 NOTE — Anesthesia Preprocedure Evaluation (Addendum)
Anesthesia Evaluation  Patient identified by MRN, date of birth, ID band Patient awake    Reviewed: Allergy & Precautions, NPO status , Patient's Chart, lab work & pertinent test results  History of Anesthesia Complications (+) PONV and history of anesthetic complications  Airway Mallampati: II  TM Distance: >3 FB Neck ROM: Full    Dental  (+) Dental Advisory Given, Lower Dentures, Upper Dentures   Pulmonary pneumonia, resolved,    Pulmonary exam normal breath sounds clear to auscultation       Cardiovascular Exercise Tolerance: Good hypertension, Pt. on medications + Peripheral Vascular Disease  Normal cardiovascular exam Rhythm:Regular Rate:Normal     Neuro/Psych  Neuromuscular disease    GI/Hepatic GERD  Controlled,  Endo/Other  Hypothyroidism   Renal/GU      Musculoskeletal  (+) Arthritis ,   Abdominal   Peds  Hematology  (+) anemia ,   Anesthesia Other Findings   Reproductive/Obstetrics                            Anesthesia Physical Anesthesia Plan  ASA: 2  Anesthesia Plan: General   Post-op Pain Management: Minimal or no pain anticipated   Induction: Intravenous  PONV Risk Score and Plan: TIVA  Airway Management Planned: Nasal Cannula and Natural Airway  Additional Equipment:   Intra-op Plan:   Post-operative Plan:   Informed Consent: I have reviewed the patients History and Physical, chart, labs and discussed the procedure including the risks, benefits and alternatives for the proposed anesthesia with the patient or authorized representative who has indicated his/her understanding and acceptance.     Dental advisory given  Plan Discussed with: CRNA and Surgeon  Anesthesia Plan Comments:        Anesthesia Quick Evaluation

## 2021-06-30 NOTE — H&P (Signed)
_0 @   Primary Care Physician:  Lindell Spar, MD Primary Gastroenterologist:  Dr. Gala Romney  Pre-Procedure History & Physical: HPI:  [name] is a 80 y.o. female here for paper hematochezia and change in bowel habits.  Here for colonoscopy to further evaluate. Since stopping enemas and using MiraLAX she states her bowel function is improved quite a bit has not seen any blood per rectum.   Past Medical History:  Diagnosis Date   Allergy    Anemia    prior to hysterectomy   Back pain    comepensating for knee pain per pt   Cataract    right eye and immature   Complication of anesthesia    Constipation    takes Colace daily and Milk of Mag   CTS (carpal tunnel syndrome) 08/31/2016   Dizziness    but goes away real quick   Dry eyes    uses Eye Drops daily as needed   Female hypogonadism syndrome    GERD (gastroesophageal reflux disease)    History of colon polyps    Hyperlipidemia    was on meds but stopped taking back in June 2015   Hypertension    takes Lisinopril daily   Hypothyroidism    takes Synthroid daily   Joint pain    Obesity (BMI 30.0-34.9)    Peripheral arterial disease (HCC)    Pneumonia    as a baby   PONV (postoperative nausea and vomiting)    Primary localized osteoarthritis of left knee 04/17/2015   Primary localized osteoarthritis of right knee    knees   Urinary frequency    Urinary urgency     Past Surgical History:  Procedure Laterality Date   ABDOMINAL HYSTERECTOMY  90's   complete   APPENDECTOMY  80's   BACK SURGERY     CATARACT EXTRACTION W/PHACO Right 12/23/2015   Procedure: CATARACT EXTRACTION PHACO AND INTRAOCULAR LENS PLACEMENT RIGHT EYE CDE=6.42;  Surgeon: Williams Che, MD;  Location: [facility];  Service: Ophthalmology;  Laterality: Right;   COLONOSCOPY  09/09/2004   XNT:ZGYFVC rectum and colon   COLONOSCOPY N/A 11/08/2013   Procedure: COLONOSCOPY;  Surgeon: Daneil Dolin, MD;  Location: [facility];  Service: Endoscopy;   Laterality: N/A;  10:45   JOINT REPLACEMENT     2015, 2016   [place]   RECTOCELE REPAIR  10/2007   SPINE SURGERY     ruptured disc   TOTAL KNEE ARTHROPLASTY Right 04/04/2014   DR Noemi Chapel   TOTAL KNEE ARTHROPLASTY Right 04/04/2014   Procedure: RIGHT TOTAL KNEE ARTHROPLASTY;  Surgeon: Lorn Junes, MD;  Location: [place];  Service: Orthopedics;  Laterality: Right;   TOTAL KNEE ARTHROPLASTY Left 04/29/2015   Procedure: TOTAL KNEE ARTHROPLASTY;  Surgeon: Elsie Saas, MD;  Location: [place];  Service: Orthopedics;  Laterality: Left;   TUBAL LIGATION      Prior to Admission medications   Medication Sig Start Date End Date Taking? Authorizing Provider  acetaminophen (TYLENOL) 500 MG tablet Take 500 mg by mouth 2 (two) times daily as needed for moderate pain.   Yes [provider]  aspirin EC 81 MG tablet Take 81 mg by mouth daily.   Yes [provider]  b complex vitamins capsule Take 1 capsule by mouth daily.   Yes [provider]  Cholecalciferol (VITAMIN D) 50 MCG (2000 UT) tablet Take 2,000 Units by mouth daily.   Yes [provider]  levothyroxine (SYNTHROID)  25 MCG tablet Take 1 tablet (25 mcg total) by mouth daily. 04/25/21  Yes Lindell Spar, MD  lisinopril (ZESTRIL) 10 MG tablet Take 1 tablet (10 mg total) by mouth daily. 05/15/21  Yes Lindell Spar, MD  magnesium hydroxide (MILK OF MAGNESIA) 400 MG/5ML suspension Take 15 mLs by mouth daily as needed for moderate constipation.   Yes [provider]  polyethylene glycol-electrolytes (TRILYTE) 420 g solution Take 4,000 mLs by mouth as directed. 05/27/21  Yes Daneil Dolin, MD    Allergies as of 05/27/2021 - Review Complete 05/27/2021  Allergen Reaction Noted   Codeine Nausea And Vomiting 03/21/2014   Crestor [rosuvastatin] Other (See Comments) 01/04/2017   Flexall [menthol (topical analgesic)] Rash 03/21/2014   Penicillins Rash 11/17/2012    Family History  Problem  Relation Age of Onset   Colon cancer Sister        age 42s   Myasthenia gravis Sister    Bladder Cancer Brother    Lung cancer Brother        age 38   Alzheimer's disease Mother    COPD Father    Asthma Father    Alcohol abuse Brother        likely died of pneumonia    Social History   Socioeconomic History   Marital status: Widowed    Spouse name: Not on file   Number of children: 2   Years of education: 51   Highest education level: Not on file  Occupational History   Occupation: retired    Comment: Doctor, general practice  Tobacco Use   Smoking status: Never   Smokeless tobacco: Never  Scientific laboratory technician Use: Never used  Substance and Sexual Activity   Alcohol use: No   Drug use: No   Sexual activity: Never    Birth control/protection: Surgical  Other Topics Concern   Not on file  Social History Narrative   Husband died after over 59 years and then a year after husband died, she met a man who she had a relationship with for over ten years. He died in 09/25/2017 after long illness.   Live alone.    Has two children, grown sons.    Gets out with neices, enjoys going out to eat, going to [employer].   Goes to church.    Eats all food groups, eats some meat.       Social Determinants of Health   Financial Resource Strain: Low Risk    Difficulty of Paying Living Expenses: Not hard at all  Food Insecurity: No Food Insecurity   Worried About Charity fundraiser in the Last Year: Never true   [place] in the Last Year: Never true  Transportation Needs: No Transportation Needs   Lack of Transportation (Medical): No   Lack of Transportation (Non-Medical): No  Physical Activity: Inactive   Days of Exercise per Week: 0 days   Minutes of Exercise per Session: 0 min  Stress: No Stress Concern Present   Feeling of Stress : Only a little  Social Connections: Socially Isolated   Frequency of Communication with Friends and Family: More than three times a week    Frequency of Social Gatherings with Friends and Family: Three times a week   Attends Religious Services: Never   Active Member of Clubs or Organizations: No   Attends Archivist Meetings: Never   Marital Status: Widowed  Intimate Partner Violence: Not At Risk  Fear of Current or Ex-Partner: No   Emotionally Abused: No   Physically Abused: No   Sexually Abused: No    Review of Systems: See HPI, otherwise negative ROS  Physical Exam: BP 129/72   Pulse 80   Temp 98.5 F (36.9 C) (Oral)   Resp 15   Ht _0  (1.6 m)   Wt 74.8 kg   LMP 07/20/1990 (Approximate)   SpO2 96%   BMI 29.23 kg/m  General:   Alert,  Well-developed, well-nourished, pleasant and cooperative in NAD Neck:  Supple; no masses or thyromegaly. No significant cervical adenopathy. Lungs:  Clear throughout to auscultation.   No wheezes, crackles, or rhonchi. No acute distress. Heart:  Regular rate and rhythm; no murmurs, clicks, rubs,  or gallops. Abdomen: Non-distended, normal bowel sounds.  Soft and nontender without appreciable mass or hepatosplenomegaly.  Pulses:  Normal pulses noted. Extremities:  Without clubbing or edema.  Impression/Plan: 80 year old lady with paper hematochezia and change in bowel habits.  More recently improved with maneuvers as outlined above.  I have offered the patient a diagnostic colonoscopy today.  The risks, benefits, limitations, alternatives and imponderables have been reviewed with the patient. Questions have been answered. All parties are agreeable.       Notice: This dictation was prepared with Dragon dictation along with smaller phrase technology. Any transcriptional errors that result from this process are unintentional and may not be corrected upon review.

## 2021-06-30 NOTE — Transfer of Care (Signed)
Immediate Anesthesia Transfer of Care Note  Patient: [name]  Procedure(s) Performed: COLONOSCOPY WITH PROPOFOL  Patient Location: Endoscopy Unit  Anesthesia Type:General  Level of Consciousness: drowsy  Airway & Oxygen Therapy: Patient Spontanous Breathing  Post-op Assessment: Report given to RN and Post -op Vital signs reviewed and stable  Post vital signs: Reviewed and stable  Last Vitals:  Vitals Value Taken Time  BP    Temp    Pulse    Resp    SpO2      Last Pain:  Vitals:   06/30/21 1059  TempSrc:   PainSc: 1          Complications: No notable events documented.

## 2021-06-30 NOTE — Anesthesia Postprocedure Evaluation (Signed)
Anesthesia Post Note  Patient: [name]  Procedure(s) Performed: COLONOSCOPY WITH PROPOFOL  Patient location during evaluation: Endoscopy Anesthesia Type: General Level of consciousness: awake and alert and oriented Pain management: pain level controlled Vital Signs Assessment: post-procedure vital signs reviewed and stable Respiratory status: spontaneous breathing, nonlabored ventilation and respiratory function stable Cardiovascular status: stable and blood pressure returned to baseline Postop Assessment: no apparent nausea or vomiting Anesthetic complications: no   No notable events documented.   Last Vitals:  Vitals:   06/30/21 0957 06/30/21 1124  BP: 129/72 (!) 99/40  Pulse: 80 80  Resp: 15 14  Temp: 36.9 C 36.5 C  SpO2: 96% 95%    Last Pain:  Vitals:   06/30/21 1128  TempSrc:   PainSc: 0-No pain                 Houston Zapien C Doni Bacha

## 2021-06-30 NOTE — Op Note (Signed)
[facility] Patient Name: [name] Procedure Date: 06/30/2021 10:34 AM MRN: [id number] Date of Birth: 09-13-1940 Attending MD: Norvel Richards , MD CSN: [id number] Age: 80 Admit Type: Outpatient Procedure:                Colonoscopy Indications:              Hematochezia, Change in bowel habits Providers:                Norvel Richards, MD, Rosina Lowenstein, RN, Wynonia Musty Tech, Technician Referring MD:              Medicines:                Propofol per Anesthesia Complications:            No immediate complications. Estimated Blood Loss:     Estimated blood loss: none. Procedure:                Pre-Anesthesia Assessment:                           - Prior to the procedure, a History and Physical                            was performed, and patient medications and                            allergies were reviewed. The patient's tolerance of                            previous anesthesia was also reviewed. The risks                            and benefits of the procedure and the sedation                            options and risks were discussed with the patient.                            All questions were answered, and informed consent                            was obtained. Prior Anticoagulants: The patient has                            taken no previous anticoagulant or antiplatelet                            agents. ASA Grade Assessment: II - A patient with                            mild systemic disease. After reviewing the risks  and benefits, the patient was deemed in                            satisfactory condition to undergo the procedure.                           After obtaining informed consent, the colonoscope                            was passed under direct vision. Throughout the                            procedure, the patient's blood pressure, pulse, and                             oxygen saturations were monitored continuously. The                            [id number]) scope was introduced through the                            anus and advanced to the the cecum, identified by                            appendiceal orifice and ileocecal valve. The                            colonoscopy was performed without difficulty. The                            patient tolerated the procedure well. The quality                            of the bowel preparation was adequate. Scope In: 11:02:44 AM Scope Out: 11:21:15 AM Scope Withdrawal Time: 0 hours 7 minutes 47 seconds  Total Procedure Duration: 0 hours 18 minutes 31 seconds  Findings:      The perianal and digital rectal examinations were normal.      Scattered medium-mouthed diverticula were found in the sigmoid colon,       descending colon and transverse colon.      Non-bleeding internal hemorrhoids were found during retroflexion. The       hemorrhoids were moderate, medium-sized and Grade I (internal       hemorrhoids that do not prolapse).      The exam was otherwise without abnormality on direct and retroflexion       views. Redundant colon. Impression:               - Diverticulosis in the sigmoid colon, in the                            descending colon and in the transverse colon.                           - Non-bleeding internal hemorrhoids.                           -  The examination was otherwise normal on direct                            and retroflexion views.                           - No specimens collected. Likely bled from                            hemorrhoids. Since starting back on MiraLAX                            bleeding has ceased. Bowel function has normalized. Moderate Sedation:      Moderate (conscious) sedation was personally administered by an       anesthesia professional. The following parameters were monitored: oxygen       saturation, heart rate, blood pressure, respiratory  rate, EKG, adequacy       of pulmonary ventilation, and response to care. Recommendation:           - Patient has a contact number available for                            emergencies. The signs and symptoms of potential                            delayed complications were discussed with the                            patient. Return to normal activities tomorrow.                            Written discharge instructions were provided to the                            patient.                           - Resume previous diet.                           - Continue present medications.                           - No repeat colonoscopy due to age. Continue                            MiraLAX nightly as needed to achieve at least 3                            bowel movements weekly.                           - Return to GI clinic in 6 months. Procedure Code(s):        --- Professional ---  45378, Colonoscopy, flexible; diagnostic, including                            collection of specimen(s) by brushing or washing,                            when performed (separate procedure) Diagnosis Code(s):        --- Professional ---                           K64.0, First degree hemorrhoids                           K92.1, Melena (includes Hematochezia)                           R19.4, Change in bowel habit                           K57.30, Diverticulosis of large intestine without                            perforation or abscess without bleeding CPT copyright 2019 American Medical Association. All rights reserved. The codes documented in this report are preliminary and upon coder review may  be revised to meet current compliance requirements. Cristopher Estimable. Andri Prestia, MD Norvel Richards, MD 06/30/2021 11:29:21 AM This report has been signed electronically. Number of Addenda: 0

## 2021-07-02 ENCOUNTER — Encounter (HOSPITAL_COMMUNITY): Payer: Self-pay | Admitting: Internal Medicine

## 2021-07-07 DIAGNOSIS — E039 Hypothyroidism, unspecified: Secondary | ICD-10-CM | POA: Diagnosis not present

## 2021-07-07 DIAGNOSIS — Z0000 Encounter for general adult medical examination without abnormal findings: Secondary | ICD-10-CM | POA: Diagnosis not present

## 2021-07-08 LAB — CBC WITH DIFFERENTIAL/PLATELET
Basophils Absolute: 0.1 10*3/uL (ref 0.0–0.2)
Basos: 1 %
EOS (ABSOLUTE): 0.1 10*3/uL (ref 0.0–0.4)
Eos: 2 %
Hematocrit: 41.2 % (ref 34.0–46.6)
Hemoglobin: 13.7 g/dL (ref 11.1–15.9)
Immature Grans (Abs): 0 10*3/uL (ref 0.0–0.1)
Immature Granulocytes: 0 %
Lymphocytes Absolute: 2.4 10*3/uL (ref 0.7–3.1)
Lymphs: 38 %
MCH: 28.8 pg (ref 26.6–33.0)
MCHC: 33.3 g/dL (ref 31.5–35.7)
MCV: 87 fL (ref 79–97)
Monocytes Absolute: 0.5 10*3/uL (ref 0.1–0.9)
Monocytes: 9 %
Neutrophils Absolute: 3.1 10*3/uL (ref 1.4–7.0)
Neutrophils: 50 %
Platelets: 226 10*3/uL (ref 150–450)
RBC: 4.76 x10E6/uL (ref 3.77–5.28)
RDW: 13.7 % (ref 11.7–15.4)
WBC: 6.2 10*3/uL (ref 3.4–10.8)

## 2021-07-08 LAB — LIPID PANEL
Chol/HDL Ratio: 4.6 ratio — ABNORMAL HIGH (ref 0.0–4.4)
Cholesterol, Total: 232 mg/dL — ABNORMAL HIGH (ref 100–199)
HDL: 50 mg/dL (ref >39)
LDL Chol Calc (NIH): 160 mg/dL — ABNORMAL HIGH (ref 0–99)
Triglycerides: 125 mg/dL (ref 0–149)
VLDL Cholesterol Cal: 22 mg/dL (ref 5–40)

## 2021-07-08 LAB — CMP14+EGFR
ALT: 11 IU/L (ref 0–32)
AST: 20 IU/L (ref 0–40)
Albumin/Globulin Ratio: 2 (ref 1.2–2.2)
Albumin: 4.3 g/dL (ref 3.7–4.7)
Alkaline Phosphatase: 78 IU/L (ref 44–121)
BUN/Creatinine Ratio: 17 (ref 12–28)
BUN: 17 mg/dL (ref 8–27)
Bilirubin Total: 0.3 mg/dL (ref 0.0–1.2)
CO2: 26 mmol/L (ref 20–29)
Calcium: 9.4 mg/dL (ref 8.7–10.3)
Chloride: 102 mmol/L (ref 96–106)
Creatinine, Ser: 1.02 mg/dL — ABNORMAL HIGH (ref 0.57–1.00)
Globulin, Total: 2.2 g/dL (ref 1.5–4.5)
Glucose: 90 mg/dL (ref 70–99)
Potassium: 5.1 mmol/L (ref 3.5–5.2)
Sodium: 142 mmol/L (ref 134–144)
Total Protein: 6.5 g/dL (ref 6.0–8.5)
eGFR: 56 mL/min/{1.73_m2} — ABNORMAL LOW (ref >59)

## 2021-07-08 LAB — TSH+T4F+T3FREE
Free T4: 0.83 ng/dL (ref 0.82–1.77)
T3, Free: 2.3 pg/mL (ref 2.0–4.4)
TSH: 29.5 u[IU]/mL — ABNORMAL HIGH (ref 0.450–4.500)

## 2021-07-16 ENCOUNTER — Ambulatory Visit (INDEPENDENT_AMBULATORY_CARE_PROVIDER_SITE_OTHER): Payer: Medicare PPO | Admitting: Internal Medicine

## 2021-07-16 ENCOUNTER — Other Ambulatory Visit: Payer: Self-pay

## 2021-07-16 ENCOUNTER — Encounter: Payer: Self-pay | Admitting: Internal Medicine

## 2021-07-16 VITALS — BP 132/82 | HR 77 | Resp 18 | Ht 63.0 in | Wt 170.1 lb

## 2021-07-16 DIAGNOSIS — E039 Hypothyroidism, unspecified: Secondary | ICD-10-CM

## 2021-07-16 DIAGNOSIS — Z0001 Encounter for general adult medical examination with abnormal findings: Secondary | ICD-10-CM | POA: Insufficient documentation

## 2021-07-16 DIAGNOSIS — M4722 Other spondylosis with radiculopathy, cervical region: Secondary | ICD-10-CM

## 2021-07-16 DIAGNOSIS — I10 Essential (primary) hypertension: Secondary | ICD-10-CM | POA: Diagnosis not present

## 2021-07-16 HISTORY — DX: Encounter for general adult medical examination with abnormal findings: Z00.01

## 2021-07-16 MED ORDER — LEVOTHYROXINE SODIUM 50 MCG PO TABS: 50.0000 ug | ORAL_TABLET | Freq: Every day | ORAL | 0 refills | Status: DC

## 2021-07-16 MED ORDER — PREDNISONE 10 MG (21) PO TBPK: ORAL_TABLET | ORAL | 0 refills | Status: DC

## 2021-07-16 NOTE — Patient Instructions (Addendum)
Please start taking Levothyroxine 50 mcg once daily instead of 25 mg.  Please take Prednisone as prescribed for upper back pain.  Please continue to follow heart healthy diet and ambulate as tolerated.  Please consider getting Shingrix vaccine at your local pharmacy.  Start taking Magnesium glycinate 200 mg once daily.

## 2021-07-16 NOTE — Assessment & Plan Note (Signed)
BP Readings from Last 1 Encounters:  07/16/21 132/82   Well-controlled Counseled for compliance with the medications Advised DASH diet and moderate exercise/walking, at least 150 mins/week

## 2021-07-16 NOTE — Assessment & Plan Note (Signed)

## 2021-07-16 NOTE — Assessment & Plan Note (Signed)
Current upper back pain could be related to DDD of cervical spine radiating to RUE Trapezius strain can also cause pain Prednisone taper, avoid NSAIDs while taking Prednisone She wants to avoid muscle relaxers as she had bradycardia with them in the past

## 2021-07-16 NOTE — Assessment & Plan Note (Signed)
Lab Results  Component Value Date   TSH 29.500 (H) 07/07/2021   will increase levothyroxine dose to 50 mcg QD Check TSH and free T4

## 2021-07-16 NOTE — Progress Notes (Signed)
Established Patient Office Visit  Subjective:  Patient ID: [name], female    DOB: June 05, 1941  Age: 80 y.o. MRN: [id number]  CC:  Chief Complaint  Patient presents with   Annual Exam    Annual exam pt has been gaining weight thinks maybe thyroid on 06-23-21 she thinks she did something to her shoulders started in right shoulder then pain moved down arm now having numbness in her hand possible pinched nerve     HPI [name] is a 80 y.o. female with past medical history of HTN, hypothyroidism and vitamin D deficiency who presents for annual physical.  HTN: BP is well-controlled. Takes medications regularly. Patient denies headache, dizziness, chest pain, dyspnea or palpitations.  Hypothyroidism: She has started taking levothyroxine 25 mcg instead of NP thyroid.  Her TSH is significantly elevated now as we had tried to lower dose of thyroid hormone as it was oversupplemented in the past.  She has gained 7 pounds since the last visit.  Of note, her jitteriness/anxiety and palpitations have resolved now with lower dose of levothyroxine.  She complains of upper back pain on the right side, which radiates to right shoulder and RUE along with intermittent numbness of UE. She has h/o DDD of cervical spine.  She denies any recent injury.  Blood tests were reviewed and discussed with the patient in detail.  She agrees to increase fluid intake to improve kidney function.  Previous BMP showed normal GFR.  Past Medical History:  Diagnosis Date   Allergy    Anemia    prior to hysterectomy   Back pain    comepensating for knee pain per pt   Cataract    right eye and immature   Complication of anesthesia    Constipation    takes Colace daily and Milk of Mag   CTS (carpal tunnel syndrome) 08/31/2016   Dizziness    but goes away real quick   Dry eyes    uses Eye Drops daily as needed   Female hypogonadism syndrome    GERD (gastroesophageal reflux disease)    History of colon polyps     Hyperlipidemia    was on meds but stopped taking back in June 2015   Hypertension    takes Lisinopril daily   Hypothyroidism    takes Synthroid daily   Joint pain    Obesity (BMI 30.0-34.9)    Peripheral arterial disease (HCC)    Pneumonia    as a baby   PONV (postoperative nausea and vomiting)    Primary localized osteoarthritis of left knee 04/17/2015   Primary localized osteoarthritis of right knee    knees   Urinary frequency    Urinary urgency     Past Surgical History:  Procedure Laterality Date   ABDOMINAL HYSTERECTOMY  90's   complete   APPENDECTOMY  80's   BACK SURGERY     CATARACT EXTRACTION W/PHACO Right 12/23/2015   Procedure: CATARACT EXTRACTION PHACO AND INTRAOCULAR LENS PLACEMENT RIGHT EYE CDE=6.42;  Surgeon: Williams Che, MD;  Location: [facility];  Service: Ophthalmology;  Laterality: Right;   COLONOSCOPY  09/09/2004   BDZ:HGDJME rectum and colon   COLONOSCOPY N/A 11/08/2013   Procedure: COLONOSCOPY;  Surgeon: Daneil Dolin, MD;  Location: [facility];  Service: Endoscopy;  Laterality: N/A;  10:45   COLONOSCOPY WITH PROPOFOL N/A 06/30/2021   Procedure: COLONOSCOPY WITH PROPOFOL;  Surgeon: Daneil Dolin, MD;  Location: [facility];  Service: Endoscopy;  Laterality: N/A;  11:15am   JOINT REPLACEMENT     2013/09/22, 2014-09-22   NECK SURGERY  1983   RECTOCELE REPAIR  10/2007   SPINE SURGERY     ruptured disc   TOTAL KNEE ARTHROPLASTY Right 04/04/2014   DR Noemi Chapel   TOTAL KNEE ARTHROPLASTY Right 04/04/2014   Procedure: RIGHT TOTAL KNEE ARTHROPLASTY;  Surgeon: Lorn Junes, MD;  Location: [place];  Service: Orthopedics;  Laterality: Right;   TOTAL KNEE ARTHROPLASTY Left 04/29/2015   Procedure: TOTAL KNEE ARTHROPLASTY;  Surgeon: Elsie Saas, MD;  Location: [place];  Service: Orthopedics;  Laterality: Left;   TUBAL LIGATION      Family History  Problem Relation Age of Onset   Colon cancer Sister        age 58s   Myasthenia gravis Sister    Bladder Cancer  Brother    Lung cancer Brother        age 73   Alzheimer's disease Mother    COPD Father    Asthma Father    Alcohol abuse Brother        likely died of pneumonia    Social History   Socioeconomic History   Marital status: Widowed    Spouse name: Not on file   Number of children: 2   Years of education: 62   Highest education level: Not on file  Occupational History   Occupation: retired    Comment: Doctor, general practice  Tobacco Use   Smoking status: Never   Smokeless tobacco: Never  Scientific laboratory technician Use: Never used  Substance and Sexual Activity   Alcohol use: No   Drug use: No   Sexual activity: Never    Birth control/protection: Surgical  Other Topics Concern   Not on file  Social History Narrative   Husband died after over 65 years and then a year after husband died, she met a man who she had a relationship with for over ten years. He died in 09/22/17 after long illness.   Live alone.    Has two children, grown sons.    Gets out with neices, enjoys going out to eat, going to [employer].   Goes to church.    Eats all food groups, eats some meat.       Social Determinants of Health   Financial Resource Strain: Low Risk    Difficulty of Paying Living Expenses: Not hard at all  Food Insecurity: No Food Insecurity   Worried About Charity fundraiser in the Last Year: Never true   [place] in the Last Year: Never true  Transportation Needs: No Transportation Needs   Lack of Transportation (Medical): No   Lack of Transportation (Non-Medical): No  Physical Activity: Inactive   Days of Exercise per Week: 0 days   Minutes of Exercise per Session: 0 min  Stress: No Stress Concern Present   Feeling of Stress : Only a little  Social Connections: Socially Isolated   Frequency of Communication with Friends and Family: More than three times a week   Frequency of Social Gatherings with Friends and Family: Three times a week   Attends Religious Services: Never    Active Member of Clubs or Organizations: No   Attends Archivist Meetings: Never   Marital Status: Widowed  Intimate Partner Violence: Not At Risk   Fear of Current or Ex-Partner: No   Emotionally Abused: No   Physically Abused: No   Sexually Abused:  No    Outpatient Medications Prior to Visit  Medication Sig Dispense Refill   acetaminophen (TYLENOL) 500 MG tablet Take 500 mg by mouth 2 (two) times daily as needed for moderate pain.     aspirin EC 81 MG tablet Take 81 mg by mouth daily.     b complex vitamins capsule Take 1 capsule by mouth daily.     Cholecalciferol (VITAMIN D) 50 MCG (2000 UT) tablet Take 2,000 Units by mouth daily.     lisinopril (ZESTRIL) 10 MG tablet Take 1 tablet (10 mg total) by mouth daily. 90 tablet 0   magnesium hydroxide (MILK OF MAGNESIA) 400 MG/5ML suspension Take 15 mLs by mouth daily as needed for moderate constipation.     polyethylene glycol-electrolytes (TRILYTE) 420 g solution Take 4,000 mLs by mouth as directed. 4000 mL 0   levothyroxine (SYNTHROID) 25 MCG tablet Take 1 tablet (25 mcg total) by mouth daily. 90 tablet 0   No facility-administered medications prior to visit.    Allergies  Allergen Reactions   Codeine Nausea And Vomiting   Crestor [Rosuvastatin] Other (See Comments)    Body aches   Flexall [Menthol (Topical Analgesic)] Rash   Penicillins Rash    ROS Review of Systems  Constitutional:  Negative for chills and fever.  HENT:  Negative for congestion, sinus pressure, sinus pain and sore throat.   Eyes:  Negative for pain and discharge.  Respiratory:  Negative for cough and shortness of breath.   Cardiovascular:  Negative for chest pain and palpitations.  Gastrointestinal:  Negative for abdominal pain, diarrhea, nausea and vomiting.  Endocrine: Negative for polydipsia and polyuria.  Genitourinary:  Negative for dysuria and hematuria.  Musculoskeletal:  Positive for arthralgias and back pain. Negative for neck pain and  neck stiffness.  Skin:  Negative for rash.  Neurological:  Positive for numbness. Negative for dizziness and weakness.  Psychiatric/Behavioral:  Negative for agitation and behavioral problems. The patient is not nervous/anxious.      Objective:    Physical Exam Vitals reviewed.  Constitutional:      General: She is not in acute distress.    Appearance: She is not diaphoretic.  HENT:     Head: Normocephalic and atraumatic.     Nose: Nose normal.     Mouth/Throat:     Mouth: Mucous membranes are moist.  Eyes:     General: No scleral icterus.    Extraocular Movements: Extraocular movements intact.  Cardiovascular:     Rate and Rhythm: Normal rate and regular rhythm.     Pulses: Normal pulses.     Heart sounds: Normal heart sounds. No murmur heard. Pulmonary:     Breath sounds: Normal breath sounds. No wheezing or rales.  Abdominal:     Palpations: Abdomen is soft.     Tenderness: There is no abdominal tenderness.  Musculoskeletal:        General: Tenderness (Lower trapezius area) present.     Cervical back: Neck supple. No tenderness.     Right lower leg: No edema.     Left lower leg: No edema.  Skin:    General: Skin is warm.     Findings: No rash.  Neurological:     General: No focal deficit present.     Mental Status: She is alert and oriented to person, place, and time.     Cranial Nerves: No cranial nerve deficit.     Sensory: No sensory deficit.     Motor: No weakness.  Psychiatric:        Mood and Affect: Mood normal.        Behavior: Behavior normal.    BP 132/82 (BP Location: Right Arm, Patient Position: Sitting, Cuff Size: Normal)    Pulse 77    Resp 18    Ht '5\' 3"'  (1.6 m)    Wt 170 lb 1.3 oz (77.1 kg)    LMP 07/20/1990 (Approximate)    SpO2 97%    BMI 30.13 kg/m  Wt Readings from Last 3 Encounters:  07/16/21 170 lb 1.3 oz (77.1 kg)  06/30/21 165 lb (74.8 kg)  05/27/21 169 lb (76.7 kg)    Lab Results  Component Value Date   TSH 29.500 (H) 07/07/2021    Lab Results  Component Value Date   WBC 6.2 07/07/2021   HGB 13.7 07/07/2021   HCT 41.2 07/07/2021   MCV 87 07/07/2021   PLT 226 07/07/2021   Lab Results  Component Value Date   NA 142 07/07/2021   K 5.1 07/07/2021   CO2 26 07/07/2021   GLUCOSE 90 07/07/2021   BUN 17 07/07/2021   CREATININE 1.02 (H) 07/07/2021   BILITOT 0.3 07/07/2021   ALKPHOS 78 07/07/2021   AST 20 07/07/2021   ALT 11 07/07/2021   PROT 6.5 07/07/2021   ALBUMIN 4.3 07/07/2021   CALCIUM 9.4 07/07/2021   ANIONGAP 13 05/01/2015   EGFR 56 (L) 07/07/2021   Lab Results  Component Value Date   CHOL 232 (H) 07/07/2021   Lab Results  Component Value Date   HDL 50 07/07/2021   Lab Results  Component Value Date   LDLCALC 160 (H) 07/07/2021   Lab Results  Component Value Date   TRIG 125 07/07/2021   Lab Results  Component Value Date   CHOLHDL 4.6 (H) 07/07/2021   No results found for: HGBA1C    Assessment & Plan:   Encounter for general adult medical examination with abnormal findings Physical exam as documented. Counseling done  re healthy lifestyle involving commitment to 150 minutes exercise per week, heart healthy diet, and attaining healthy weight.The importance of adequate sleep also discussed. Changes in health habits are decided on by the patient with goals and time frames  set for achieving them. Immunization and cancer screening needs are specifically addressed at this visit.  DJD (degenerative joint disease), cervical Current upper back pain could be related to DDD of cervical spine radiating to RUE Trapezius strain can also cause pain Prednisone taper, avoid NSAIDs while taking Prednisone She wants to avoid muscle relaxers as she had bradycardia with them in the past  Hypothyroidism Lab Results  Component Value Date   TSH 29.500 (H) 07/07/2021   will increase levothyroxine dose to 50 mcg QD Check TSH and free T4  Hypertension BP Readings from Last 1 Encounters:  07/16/21  132/82   Well-controlled Counseled for compliance with the medications Advised DASH diet and moderate exercise/walking, at least 150 mins/week    Meds ordered this encounter  Medications   predniSONE (STERAPRED UNI-PAK 21 TAB) 10 MG (21) TBPK tablet    Sig: Take as package instructions.    Dispense:  1 each    Refill:  0   levothyroxine (SYNTHROID) 50 MCG tablet    Sig: Take 1 tablet (50 mcg total) by mouth daily.    Dispense:  90 tablet    Refill:  0    Follow-up: Return in about 3 months (around 10/14/2021) for Hypothyroidism.    Bristol Osentoski K  Posey Pronto, MD

## 2021-10-01 DIAGNOSIS — L570 Actinic keratosis: Secondary | ICD-10-CM | POA: Diagnosis not present

## 2021-10-01 DIAGNOSIS — Z1283 Encounter for screening for malignant neoplasm of skin: Secondary | ICD-10-CM | POA: Diagnosis not present

## 2021-10-01 DIAGNOSIS — L280 Lichen simplex chronicus: Secondary | ICD-10-CM | POA: Diagnosis not present

## 2021-10-07 DIAGNOSIS — I10 Essential (primary) hypertension: Secondary | ICD-10-CM | POA: Diagnosis not present

## 2021-10-07 DIAGNOSIS — Z1329 Encounter for screening for other suspected endocrine disorder: Secondary | ICD-10-CM | POA: Diagnosis not present

## 2021-10-08 LAB — BASIC METABOLIC PANEL
BUN/Creatinine Ratio: 17 (ref 12–28)
BUN: 17 mg/dL (ref 8–27)
CO2: 25 mmol/L (ref 20–29)
Calcium: 9.2 mg/dL (ref 8.7–10.3)
Chloride: 102 mmol/L (ref 96–106)
Creatinine, Ser: 1 mg/dL (ref 0.57–1.00)
Glucose: 85 mg/dL (ref 70–99)
Potassium: 4.9 mmol/L (ref 3.5–5.2)
Sodium: 140 mmol/L (ref 134–144)
eGFR: 58 mL/min/{1.73_m2} — ABNORMAL LOW (ref >59)

## 2021-10-08 LAB — TSH+FREE T4
Free T4: 1.09 ng/dL (ref 0.82–1.77)
TSH: 13.9 u[IU]/mL — ABNORMAL HIGH (ref 0.450–4.500)

## 2021-10-11 IMAGING — US US BREAST*R* LIMITED INC AXILLA
1 series · 6 of 6 positions shown · non-contrast
Comparison: Previous exam(s).

CLINICAL DATA: Screening recall for a possible mass in the right
breast and a possible asymmetry in the left breast.

EXAM:
DIGITAL DIAGNOSTIC BILATERAL MAMMOGRAM WITH CAD AND TOMO
ULTRASOUND RIGHT BREAST

[Series 1: us breast*right* limited inc axilla · 0.07mm/px · 6 of 6 slices shown]
[im 1/6]
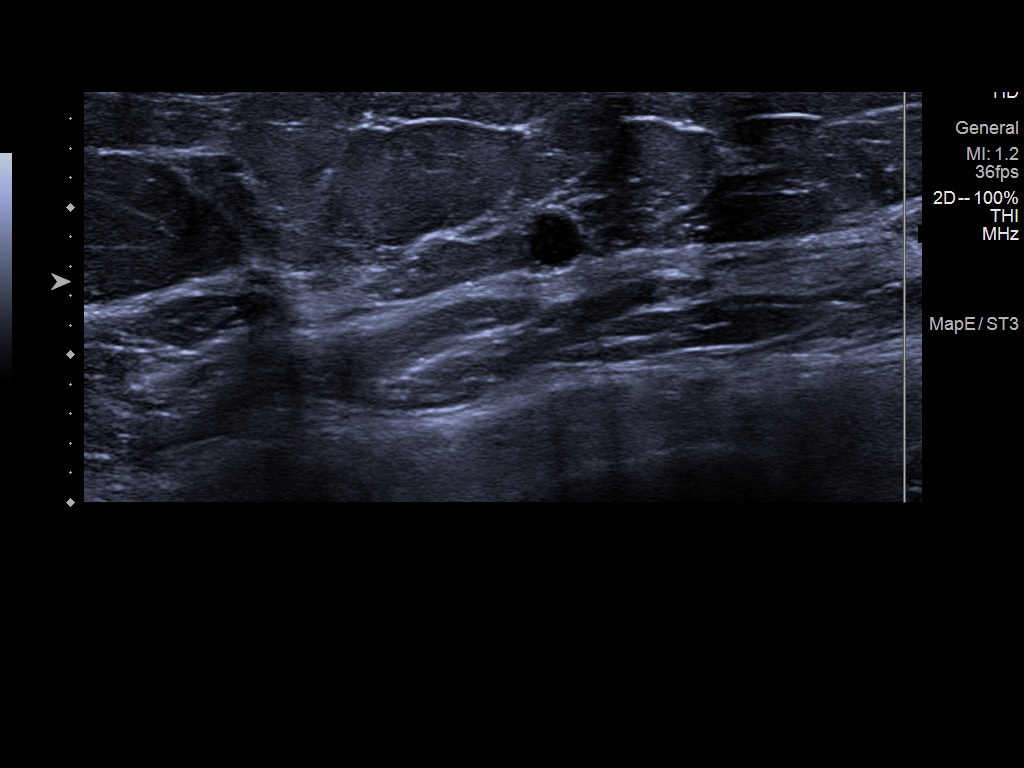
[im 2/6]
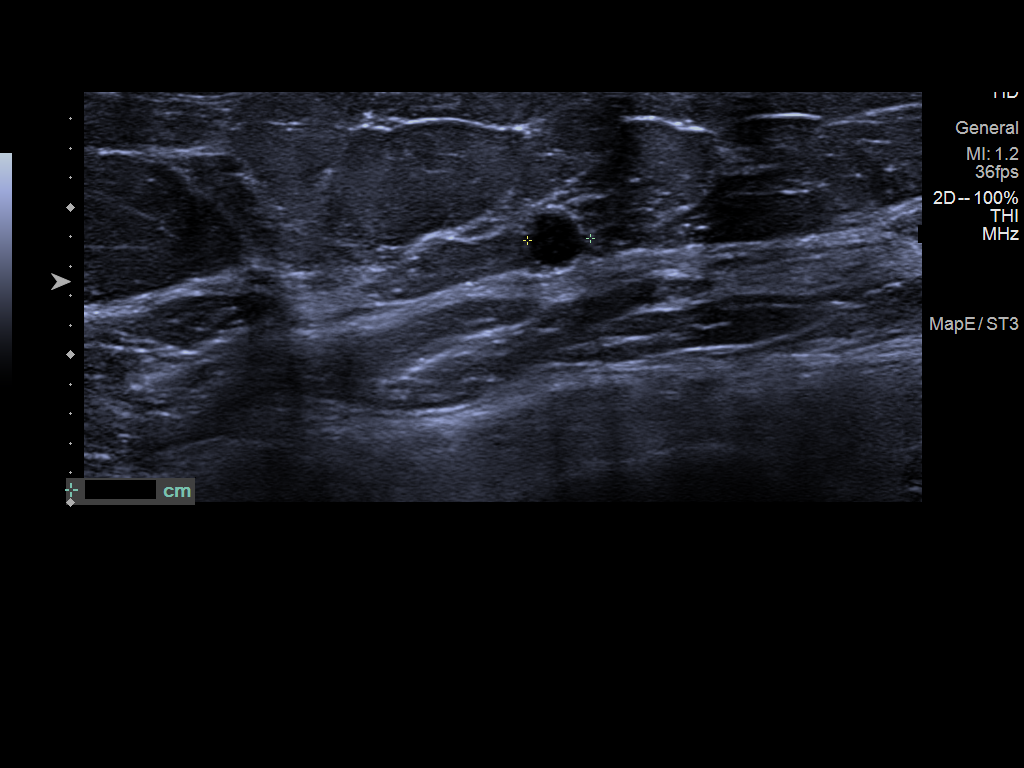
[im 3/6]
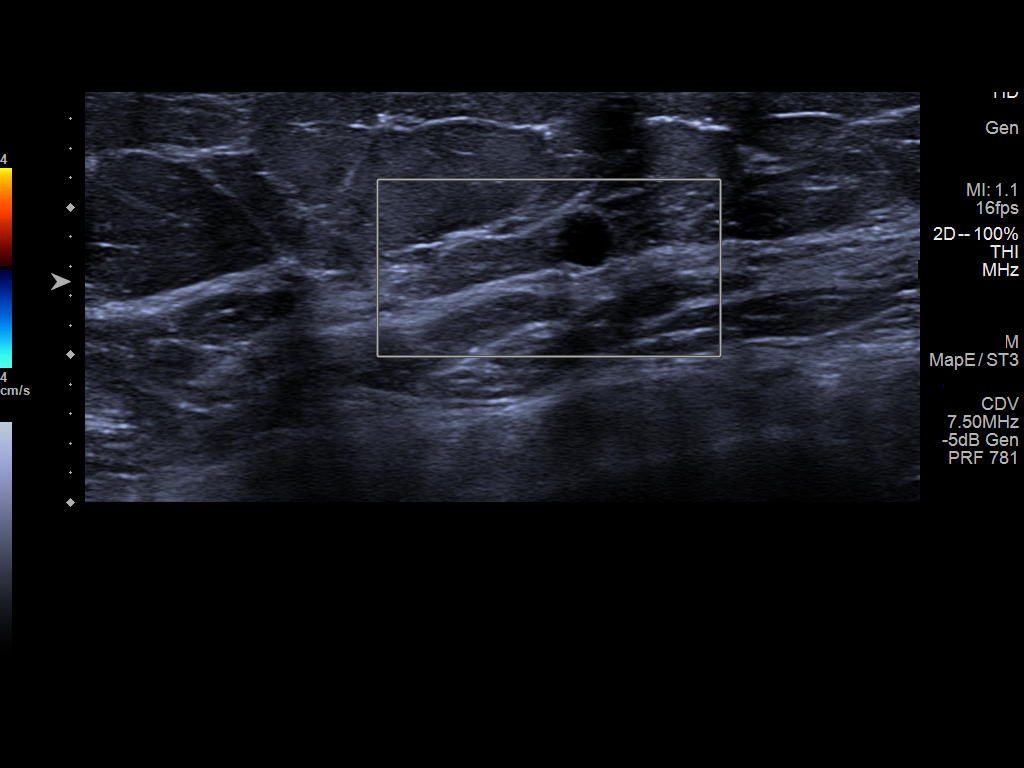
[im 4/6]
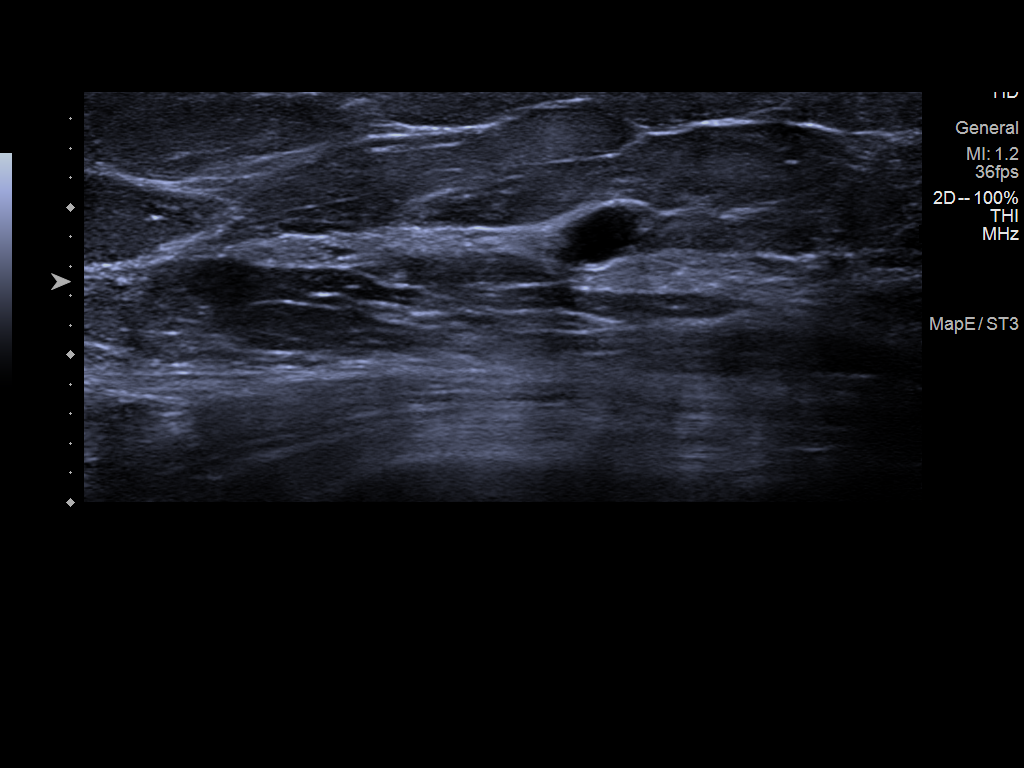
[im 5/6]
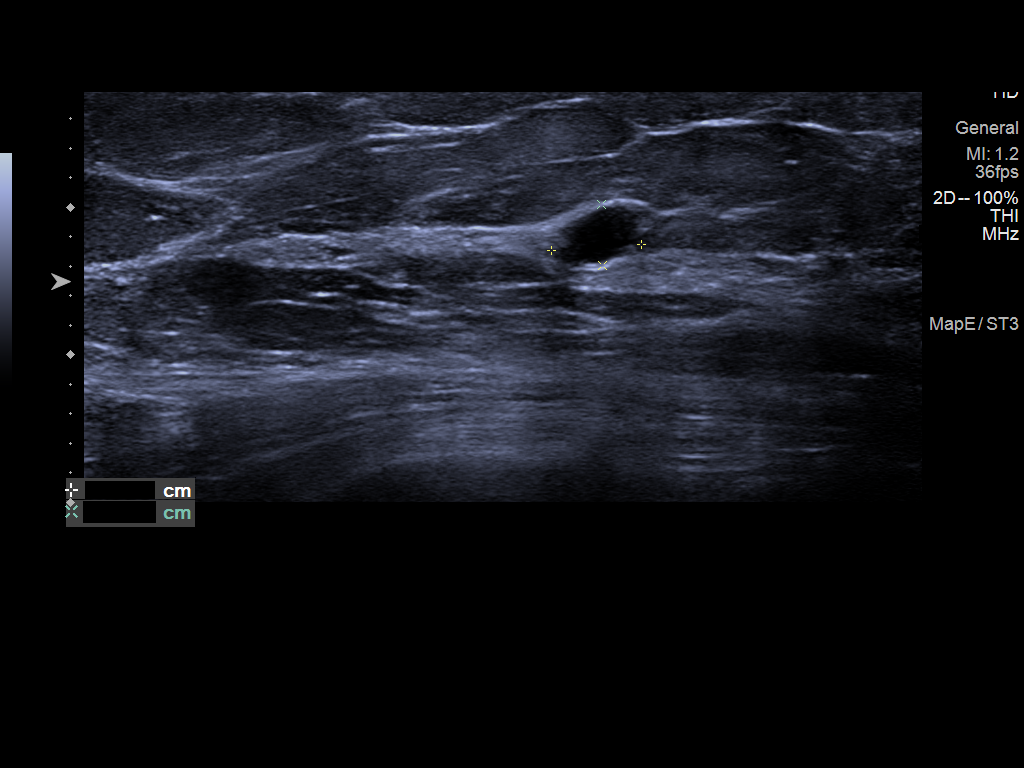
[im 6/6]
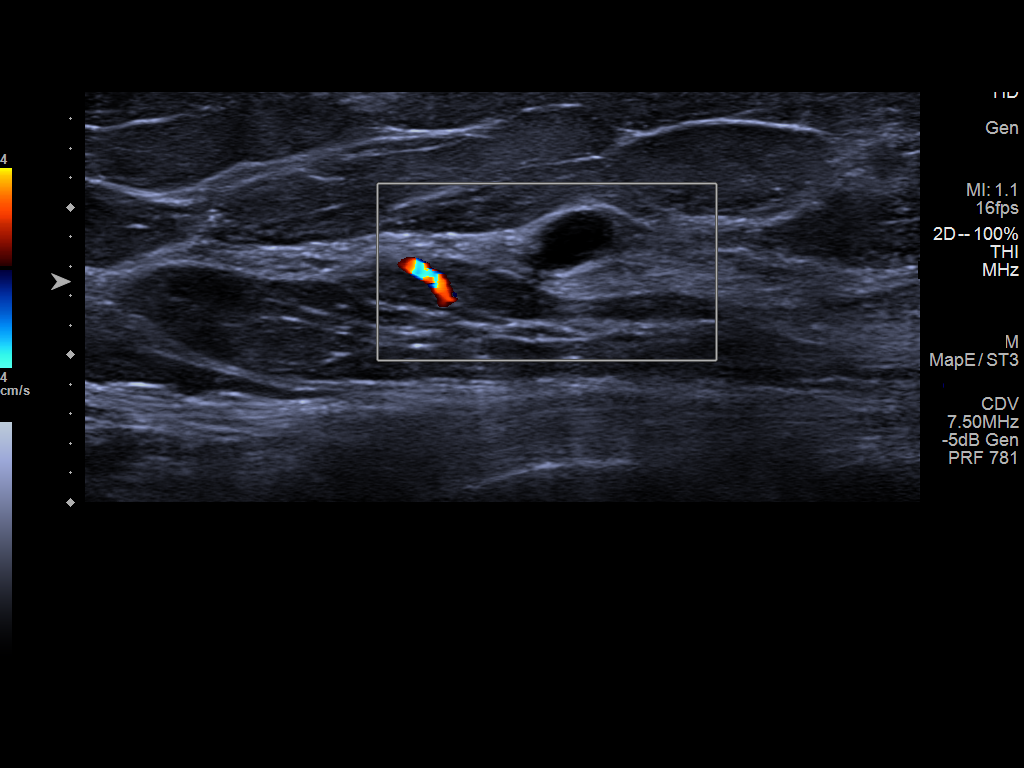

[6 of 6 positions shown; findings below may reference images not displayed]

ACR Breast Density Category c: The breast tissue is heterogeneously
dense, which may obscure small masses.
FINDINGS: In the left breast, the possible asymmetry noted on the current
screening MLO view disperses on spot compression imaging consistent
with superimposed fibroglandular tissue. There is no underlying mass
or significant residual asymmetry. There is no architectural
distortion and there are no suspicious calcifications.

In the right breast, possible mass seen in the lateral right breast
persists on the diagnostic spot-compression images. This appears as
small, oval, 5-6 mm, circumscribed mass laterally.

Mammographic images were processed with CAD.

On physical exam, no mass is palpated in the lateral right breast.

Targeted ultrasound is performed, showing a small cyst in the right
breast at 9 o'clock, 6 cm from the nipple, middle depth, measuring 6
x 4 x 4 mm, consistent in size, shape and location to the
mammographic finding. No solid masses or suspicious lesions.
IMPRESSION: 1. No evidence of breast malignancy.
2. Small benign cyst in the right breast.

RECOMMENDATION:
Screening mammogram in one year.(Code:FN-W-AG2)

I have discussed the findings and recommendations with the patient.
If applicable, a reminder letter will be sent to the patient
regarding the next appointment.

BI-RADS CATEGORY  2: Benign.

## 2021-10-11 IMAGING — MG DIGITAL DIAGNOSTIC BILAT W/ TOMO
8 series · 8 of 24 positions shown · non-contrast
Comparison: Previous exam(s).

CLINICAL DATA: Screening recall for a possible mass in the right
breast and a possible asymmetry in the left breast.

EXAM:
DIGITAL DIAGNOSTIC BILATERAL MAMMOGRAM WITH CAD AND TOMO
ULTRASOUND RIGHT BREAST

[L MLO synth-2D]
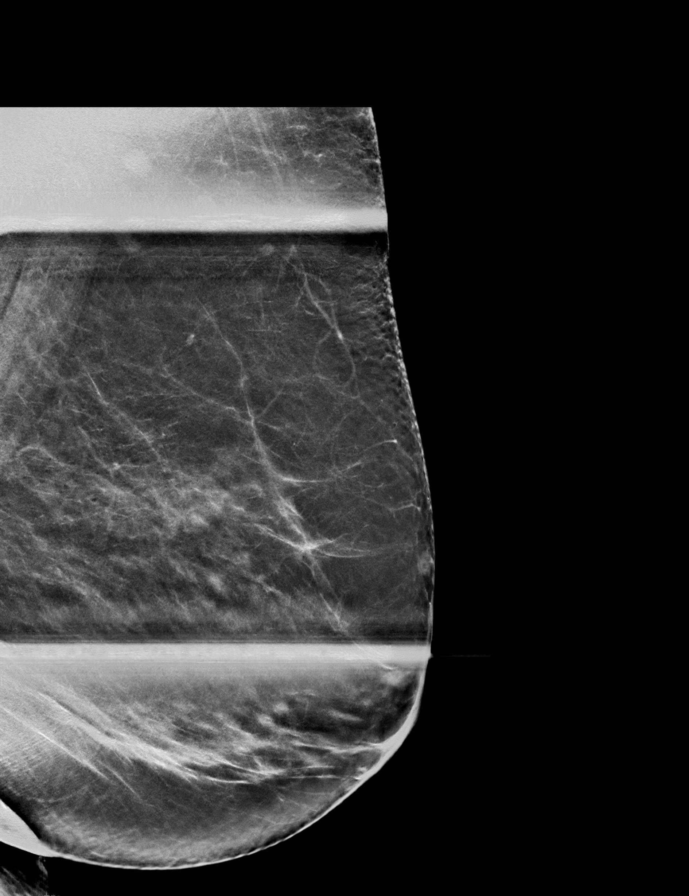

[L ML synth-2D]
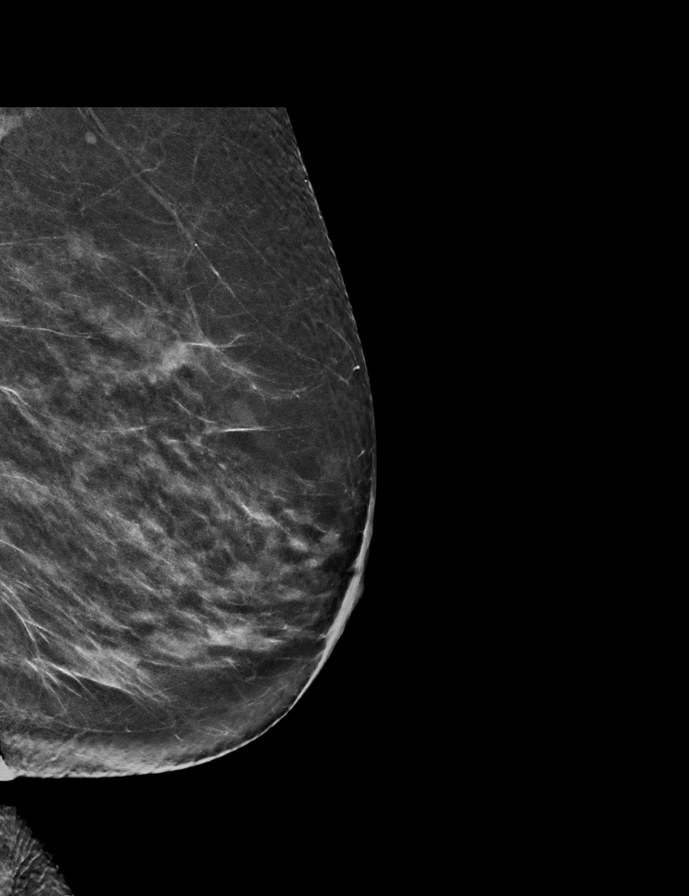

[R CC synth-2D]
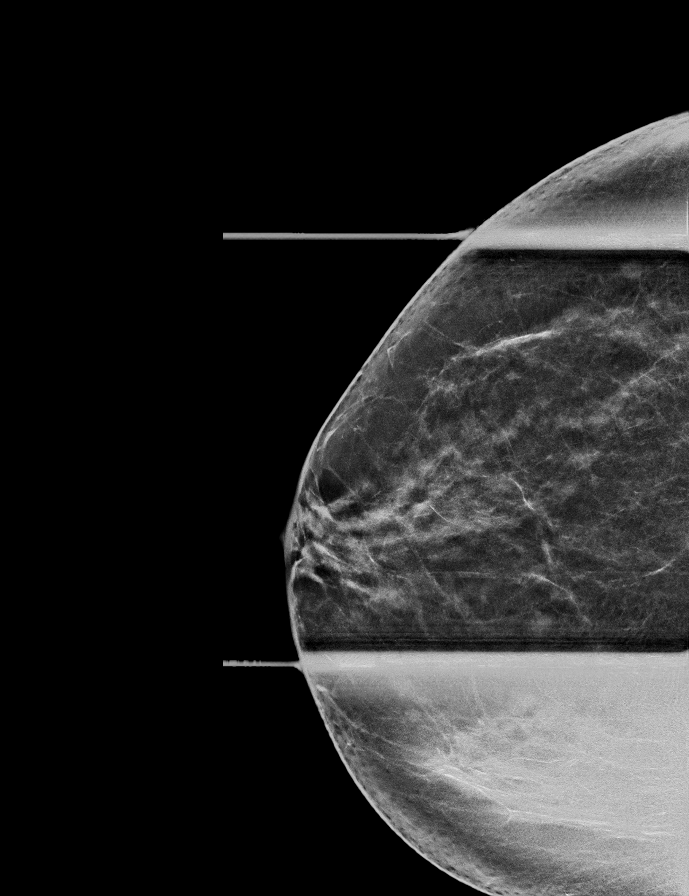

[R MLO synth-2D]
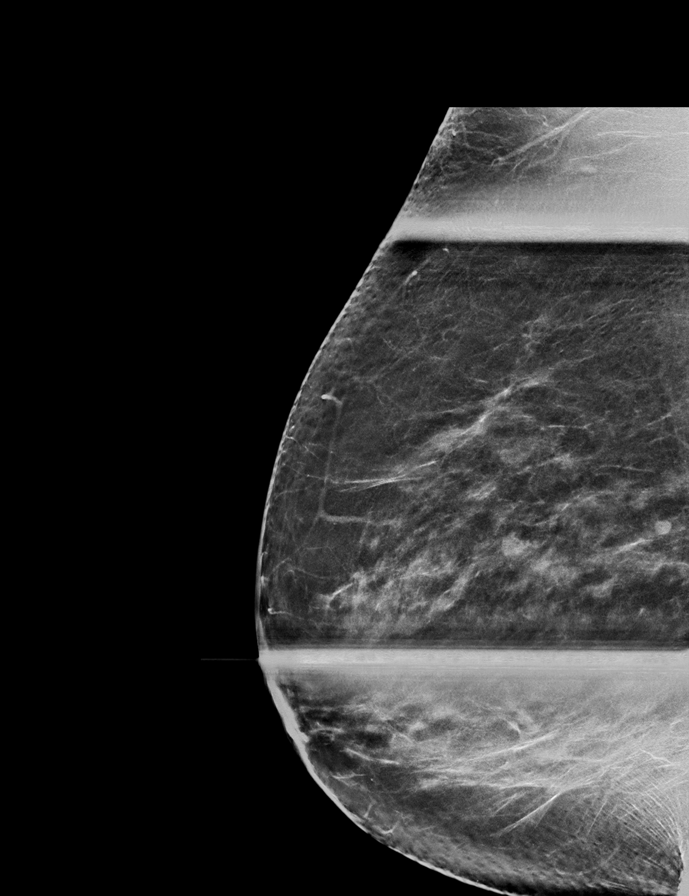

[R CC tomo · tomo slice 29/58.0]
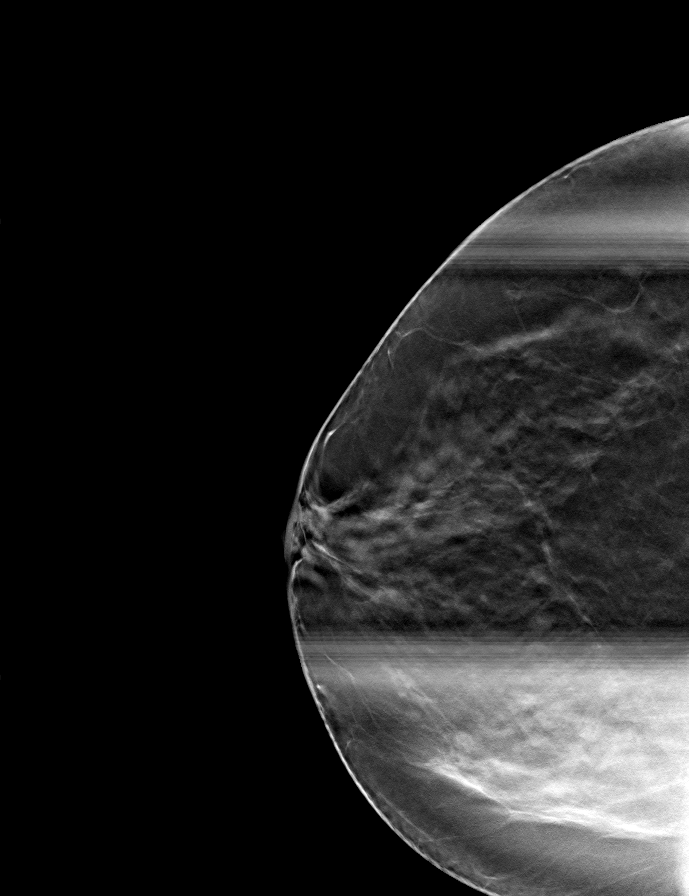

[L ML tomo · tomo slice 33/66.0]
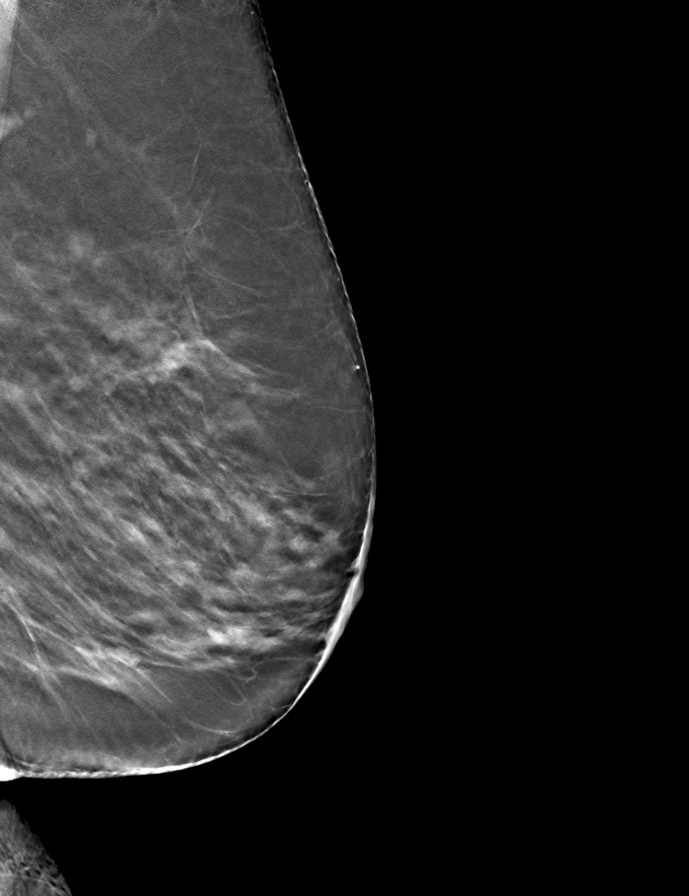

[L MLO tomo · tomo slice 36/71.0]
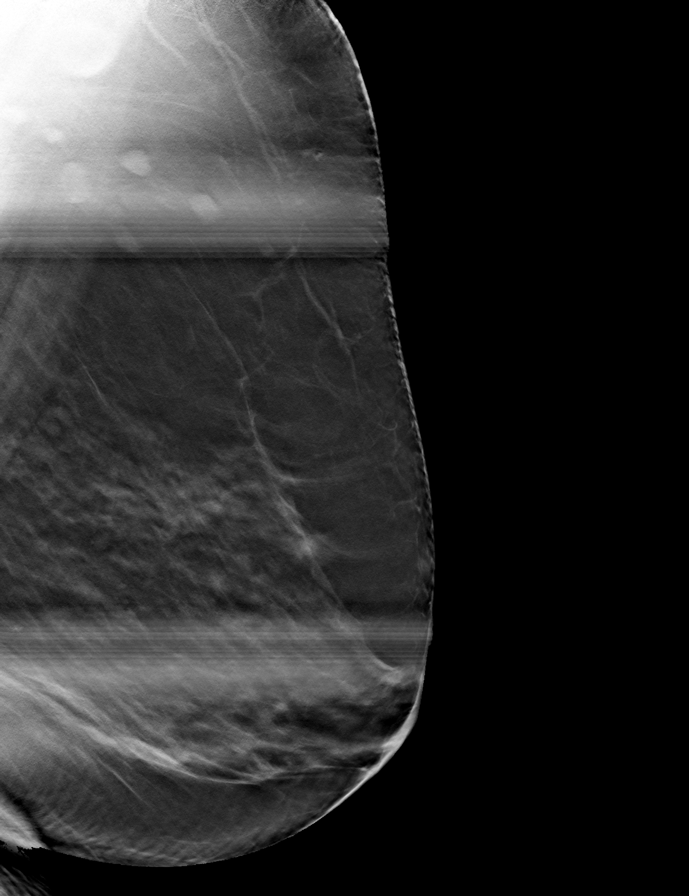

[R MLO tomo · tomo slice 33/65.0]
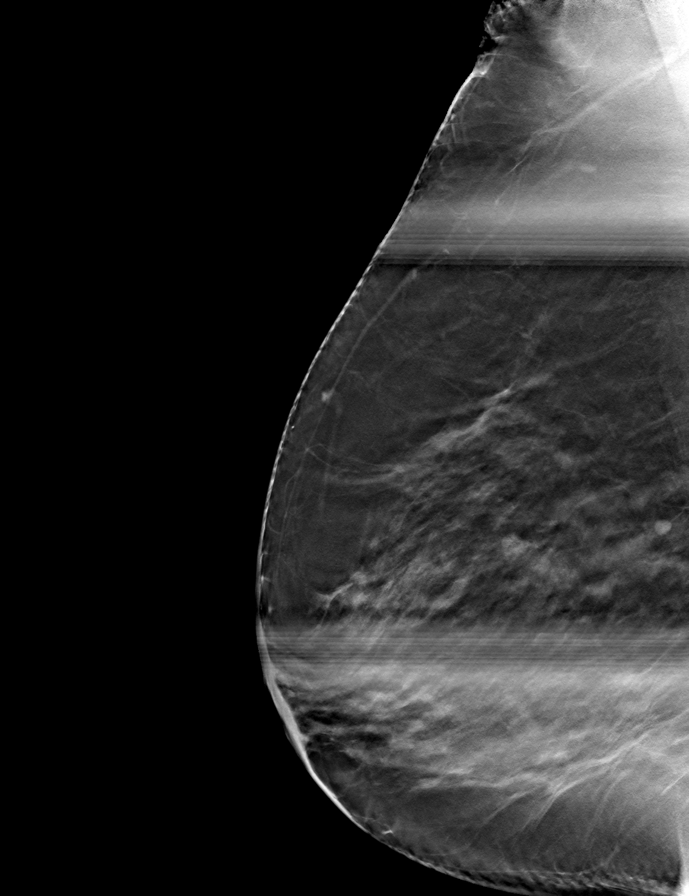

[8 of 24 positions shown; findings below may reference images not displayed]

ACR Breast Density Category c: The breast tissue is heterogeneously
dense, which may obscure small masses.
FINDINGS: In the left breast, the possible asymmetry noted on the current
screening MLO view disperses on spot compression imaging consistent
with superimposed fibroglandular tissue. There is no underlying mass
or significant residual asymmetry. There is no architectural
distortion and there are no suspicious calcifications.

In the right breast, possible mass seen in the lateral right breast
persists on the diagnostic spot-compression images. This appears as
small, oval, 5-6 mm, circumscribed mass laterally.

Mammographic images were processed with CAD.

On physical exam, no mass is palpated in the lateral right breast.

Targeted ultrasound is performed, showing a small cyst in the right
breast at 9 o'clock, 6 cm from the nipple, middle depth, measuring 6
x 4 x 4 mm, consistent in size, shape and location to the
mammographic finding. No solid masses or suspicious lesions.
IMPRESSION: 1. No evidence of breast malignancy.
2. Small benign cyst in the right breast.

RECOMMENDATION:
Screening mammogram in one year.(Code:FN-W-AG2)

I have discussed the findings and recommendations with the patient.
If applicable, a reminder letter will be sent to the patient
regarding the next appointment.

BI-RADS CATEGORY  2: Benign.

## 2021-10-14 ENCOUNTER — Ambulatory Visit: Payer: Medicare PPO | Admitting: Internal Medicine

## 2021-10-14 ENCOUNTER — Encounter: Payer: Self-pay | Admitting: Internal Medicine

## 2021-10-14 ENCOUNTER — Other Ambulatory Visit: Payer: Self-pay

## 2021-10-14 VITALS — BP 120/72 | HR 63 | Ht 63.0 in | Wt 171.1 lb

## 2021-10-14 DIAGNOSIS — G5603 Carpal tunnel syndrome, bilateral upper limbs: Secondary | ICD-10-CM | POA: Diagnosis not present

## 2021-10-14 DIAGNOSIS — E039 Hypothyroidism, unspecified: Secondary | ICD-10-CM

## 2021-10-14 DIAGNOSIS — N1831 Chronic kidney disease, stage 3a: Secondary | ICD-10-CM

## 2021-10-14 DIAGNOSIS — I10 Essential (primary) hypertension: Secondary | ICD-10-CM

## 2021-10-14 DIAGNOSIS — M25512 Pain in left shoulder: Secondary | ICD-10-CM

## 2021-10-14 HISTORY — DX: Chronic kidney disease, stage 3a: N18.31

## 2021-10-14 MED ORDER — LEVOTHYROXINE SODIUM 50 MCG PO TABS: 50.0000 ug | ORAL_TABLET | Freq: Every day | ORAL | 0 refills | Status: DC

## 2021-10-14 NOTE — Patient Instructions (Signed)
Please continue taking medications as prescribed. ? ?Please get fasting blood tests before the next visit. ?

## 2021-10-14 NOTE — Assessment & Plan Note (Signed)
Advised to use wrist brace for now 

## 2021-10-14 NOTE — Progress Notes (Signed)
? ?Established Patient Office Visit ? ?Subjective:  ?Patient ID: [name], female    DOB: 1941/07/05  Age: 81 y.o. MRN: [id number] ? ?CC:  ?Chief Complaint  ?Patient presents with  ? Follow-up  ?  Follow up soreness in L shoulder   ? ? ?HPI ?[name] is a 81 y.o. female with past medical history of  HTN, hypothyroidism and vitamin D deficiency who presents for f/u of her chronic medical conditions. ? ?Hypothyroidism: She has started taking levothyroxine 50 mcg instead of NP thyroid.  Her TSH is still elevated now, as we had tried to lower dose of thyroid hormone as it was oversupplemented in the past.  She had gained 7 pounds in the last visit, but has been stable since increasing dose of levothyroxine to 50 mcg daily..  Of note, her jitteriness/anxiety and palpitations have resolved now since switching from NP thyroid. ? ?Her BMP showed GFR of 58.  She denies any dysuria, hematuria, urinary hesitance or resistance.  She agrees to improve fluid intake. ? ?She complains of left shoulder pain for the last few days, but reports that she slept on her couch when her grandkids were visiting her, which could have provoked left neck and shoulder pain.  She also reports chronic, intermittent numbness of the hands, at nighttime.  She has had NCS with Dr. Merlene Laughter in the past and has a wrist brace currently, but has not been using it. ? ? ?Past Medical History:  ?Diagnosis Date  ? Allergy   ? Anemia   ? prior to hysterectomy  ? Back pain   ? comepensating for knee pain per pt  ? Cataract   ? right eye and immature  ? Complication of anesthesia   ? Constipation   ? takes Colace daily and Milk of Mag  ? CTS (carpal tunnel syndrome) 08/31/2016  ? Dizziness   ? but goes away real quick  ? Dry eyes   ? uses Eye Drops daily as needed  ? Female hypogonadism syndrome   ? GERD (gastroesophageal reflux disease)   ? History of colon polyps   ? Hyperlipidemia   ? was on meds but stopped taking back in June 2015  ? Hypertension    ? takes Lisinopril daily  ? Hypothyroidism   ? takes Synthroid daily  ? Joint pain   ? Obesity (BMI 30.0-34.9)   ? Peripheral arterial disease ([place])   ? Pneumonia   ? as a baby  ? PONV (postoperative nausea and vomiting)   ? Primary localized osteoarthritis of left knee 04/17/2015  ? Primary localized osteoarthritis of right knee   ? knees  ? Stage 3a chronic kidney disease ([place]) 10/14/2021  ? Urinary frequency   ? Urinary urgency   ? ? ?Past Surgical History:  ?Procedure Laterality Date  ? ABDOMINAL HYSTERECTOMY  90's  ? complete  ? APPENDECTOMY  80's  ? BACK SURGERY    ? CATARACT EXTRACTION W/PHACO Right 12/23/2015  ? Procedure: CATARACT EXTRACTION PHACO AND INTRAOCULAR LENS PLACEMENT RIGHT EYE CDE=6.42;  Surgeon: Williams Che, MD;  [facility];  Service: Ophthalmology;  Laterality: Right;  ? COLONOSCOPY  09/09/2004  ? [id number] rectum and colon  ? COLONOSCOPY N/A 11/08/2013  ? Procedure: COLONOSCOPY;  Surgeon: Daneil Dolin, MD;  [facility];  Service: Endoscopy;  Laterality: N/A;  10:45  ? COLONOSCOPY WITH PROPOFOL N/A 06/30/2021  ? Procedure: COLONOSCOPY WITH PROPOFOL;  Surgeon: Daneil Dolin, MD;  [facility];  Service: Endoscopy;  Laterality: N/A;  11:15am  ? JOINT REPLACEMENT    ? 09/30/13, 30-Sep-2014  ? NECK SURGERY  1983  ? RECTOCELE REPAIR  10/2007  ? SPINE SURGERY    ? ruptured disc  ? TOTAL KNEE ARTHROPLASTY Right 04/04/2014  ? DR Noemi Chapel  ? TOTAL KNEE ARTHROPLASTY Right 04/04/2014  ? Procedure: RIGHT TOTAL KNEE ARTHROPLASTY;  Surgeon: Lorn Junes, MD;  Location: [place];  Service: Orthopedics;  Laterality: Right;  ? TOTAL KNEE ARTHROPLASTY Left 04/29/2015  ? Procedure: TOTAL KNEE ARTHROPLASTY;  Surgeon: Elsie Saas, MD;  Location: [place];  Service: Orthopedics;  Laterality: Left;  ? TUBAL LIGATION    ? ? ?Family History  ?Problem Relation Age of Onset  ? Colon cancer Sister   ?     age 42s  ? Myasthenia gravis Sister   ? Bladder Cancer Brother   ? Lung cancer Brother   ?     age  55  ? Alzheimer's disease Mother   ? COPD Father   ? Asthma Father   ? Alcohol abuse Brother   ?     likely died of pneumonia  ? ? ?Social History  ? ?Socioeconomic History  ? Marital status: Widowed  ?  Spouse name: Not on file  ? Number of children: 2  ? Years of education: 77  ? Highest education level: Not on file  ?Occupational History  ? Occupation: retired  ?  Comment: Doctor, general practice  ?Tobacco Use  ? Smoking status: Never  ? Smokeless tobacco: Never  ?Vaping Use  ? Vaping Use: Never used  ?Substance and Sexual Activity  ? Alcohol use: No  ? Drug use: No  ? Sexual activity: Never  ?  Birth control/protection: Surgical  ?Other Topics Concern  ? Not on file  ?Social History Narrative  ? Husband died after over 87 years and then a year after husband died, she met a man who she had a relationship with for over ten years. He died in Sep 30, 2017 after long illness.  ? Live alone.   ? Has two children, grown sons.   ? Gets out with neices, enjoys going out to eat, going to [employer].  ? Goes to church.   ? Eats all food groups, eats some meat.   ?   ? ?Social Determinants of Health  ? ?Financial Resource Strain: Low Risk   ? Difficulty of Paying Living Expenses: Not hard at all  ?Food Insecurity: No Food Insecurity  ? Worried About Charity fundraiser in the Last Year: Never true  ? Ran Out of Food in the Last Year: Never true  ?Transportation Needs: No Transportation Needs  ? Lack of Transportation (Medical): No  ? Lack of Transportation (Non-Medical): No  ?Physical Activity: Inactive  ? Days of Exercise per Week: 0 days  ? Minutes of Exercise per Session: 0 min  ?Stress: No Stress Concern Present  ? Feeling of Stress : Only a little  ?Social Connections: Socially Isolated  ? Frequency of Communication with Friends and Family: More than three times a week  ? Frequency of Social Gatherings with Friends and Family: Three times a week  ? Attends Religious Services: Never  ? Active Member of Clubs or Organizations: No   ? Attends Archivist Meetings: Never  ? Marital Status: Widowed  ?Intimate Partner Violence: Not At Risk  ? Fear of Current or Ex-Partner: No  ? Emotionally Abused: No  ? Physically  Abused: No  ? Sexually Abused: No  ? ? ?Outpatient Medications Prior to Visit  ?Medication Sig Dispense Refill  ? acetaminophen (TYLENOL) 500 MG tablet Take 500 mg by mouth 2 (two) times daily as needed for moderate pain.    ? aspirin EC 81 MG tablet Take 81 mg by mouth daily.    ? b complex vitamins capsule Take 1 capsule by mouth daily.    ? Cholecalciferol (VITAMIN D) 50 MCG (2000 UT) tablet Take 2,000 Units by mouth daily.    ? lisinopril (ZESTRIL) 10 MG tablet Take 1 tablet (10 mg total) by mouth daily. 90 tablet 0  ? magnesium hydroxide (MILK OF MAGNESIA) 400 MG/5ML suspension Take 15 mLs by mouth daily as needed for moderate constipation.    ? polyethylene glycol-electrolytes (TRILYTE) 420 g solution Take 4,000 mLs by mouth as directed. 4000 mL 0  ? levothyroxine (SYNTHROID) 50 MCG tablet Take 1 tablet (50 mcg total) by mouth daily. 90 tablet 0  ? predniSONE (STERAPRED UNI-PAK 21 TAB) 10 MG (21) TBPK tablet Take as package instructions. (Patient not taking: Reported on 10/14/2021) 1 each 0  ? ?No facility-administered medications prior to visit.  ? ? ?Allergies  ?Allergen Reactions  ? Codeine Nausea And Vomiting  ? Crestor [Rosuvastatin] Other (See Comments)  ?  Body aches  ? Flexall [Menthol (Topical Analgesic)] Rash  ? Penicillins Rash  ? ? ?ROS ?Review of Systems  ?Constitutional:  Negative for chills and fever.  ?HENT:  Negative for congestion, sinus pressure, sinus pain and sore throat.   ?Eyes:  Negative for pain and discharge.  ?Respiratory:  Negative for cough and shortness of breath.   ?Cardiovascular:  Negative for chest pain and palpitations.  ?Gastrointestinal:  Negative for abdominal pain, diarrhea, nausea and vomiting.  ?Endocrine: Negative for polydipsia and polyuria.  ?Genitourinary:  Negative for dysuria  and hematuria.  ?Musculoskeletal:  Positive for arthralgias and back pain. Negative for neck pain and neck stiffness.  ?Skin:  Negative for rash.  ?Neurological:  Positive for numbness. Negative for

## 2021-10-14 NOTE — Assessment & Plan Note (Signed)
Likely due to inadequate fluid intake and/or age related decline Has h/o horseshoe kidney On ACEi Avoid nephrotoxic agents Advised to increase fluid intake Check BMP 

## 2021-10-14 NOTE — Assessment & Plan Note (Signed)
BP Readings from Last 1 Encounters:  ?10/14/21 120/72  ? ?Well-controlled ?Counseled for compliance with the medications ?Advised DASH diet and moderate exercise/walking, at least 150 mins/week ?

## 2021-10-14 NOTE — Assessment & Plan Note (Signed)
Lab Results  ?Component Value Date  ? TSH 13.900 (H) 10/07/2021  ? ?Improved from TSH - 29.50 - likely due to switching from NP thyroid to Levothyroxine, was oversupplemented in the past ?On levothyroxine dose 50 mcg QD now ?Since TSH and free T4 moving in normal direction with improvement in her symptoms, will continue same dose of Levothyroxine for now ?Check TSH and free T4 - if persistently elevated TSH, will increase dose of Levothyroxine ?

## 2021-10-19 ENCOUNTER — Encounter: Payer: Self-pay | Admitting: Emergency Medicine

## 2021-10-19 ENCOUNTER — Ambulatory Visit
Admission: EM | Admit: 2021-10-19 | Discharge: 2021-10-19 | Disposition: A | Discharge status: Home / Self Care | Payer: Medicare PPO | Attending: Urgent Care | Admitting: Urgent Care

## 2021-10-19 DIAGNOSIS — B029 Zoster without complications: Secondary | ICD-10-CM | POA: Diagnosis not present

## 2021-10-19 MED ORDER — VALACYCLOVIR HCL 1 G PO TABS: 1000.0000 mg | ORAL_TABLET | Freq: Three times a day (TID) | ORAL | 0 refills | Status: DC

## 2021-10-19 MED ORDER — GABAPENTIN 100 MG PO CAPS: 100.0000 mg | ORAL_CAPSULE | Freq: Three times a day (TID) | ORAL | 0 refills | Status: DC

## 2021-10-19 NOTE — ED Provider Notes (Signed)
?[name] ? ? ?MRN: [id number] DOB: Sep 22, 1940 ? ?Subjective:  ? ?[name] is a 81 y.o. female presenting for 4-day history of acute onset persistent worsening painful rash with blisters over the left upper chest wall that is now progressing toward the lateral chest wall and her left upper arm.  Has felt some pain over the left thoracic back.  Has not gotten her shingles vaccine. ? ?No current facility-administered medications for this encounter. ? ?Current Outpatient Medications:  ?  acetaminophen (TYLENOL) 500 MG tablet, Take 500 mg by mouth 2 (two) times daily as needed for moderate pain., Disp: , Rfl:  ?  aspirin EC 81 MG tablet, Take 81 mg by mouth daily., Disp: , Rfl:  ?  b complex vitamins capsule, Take 1 capsule by mouth daily., Disp: , Rfl:  ?  Cholecalciferol (VITAMIN D) 50 MCG (2000 UT) tablet, Take 2,000 Units by mouth daily., Disp: , Rfl:  ?  levothyroxine (SYNTHROID) 50 MCG tablet, Take 1 tablet (50 mcg total) by mouth daily., Disp: 90 tablet, Rfl: 0 ?  lisinopril (ZESTRIL) 10 MG tablet, Take 1 tablet (10 mg total) by mouth daily., Disp: 90 tablet, Rfl: 0 ?  magnesium hydroxide (MILK OF MAGNESIA) 400 MG/5ML suspension, Take 15 mLs by mouth daily as needed for moderate constipation., Disp: , Rfl:  ?  polyethylene glycol-electrolytes (TRILYTE) 420 g solution, Take 4,000 mLs by mouth as directed., Disp: 4000 mL, Rfl: 0  ? ?Allergies  ?Allergen Reactions  ? Codeine Nausea And Vomiting  ? Crestor [Rosuvastatin] Other (See Comments)  ?  Body aches  ? Flexall [Menthol (Topical Analgesic)] Rash  ? Penicillins Rash  ? ? ?Past Medical History:  ?Diagnosis Date  ? Allergy   ? Anemia   ? prior to hysterectomy  ? Back pain   ? comepensating for knee pain per pt  ? Cataract   ? right eye and immature  ? Complication of anesthesia   ? Constipation   ? takes Colace daily and Milk of Mag  ? CTS (carpal tunnel syndrome) 08/31/2016  ? Dizziness   ? but goes away real quick  ? Dry eyes   ? uses  Eye Drops daily as needed  ? Female hypogonadism syndrome   ? GERD (gastroesophageal reflux disease)   ? History of colon polyps   ? Hyperlipidemia   ? was on meds but stopped taking back in June 2015  ? Hypertension   ? takes Lisinopril daily  ? Hypothyroidism   ? takes Synthroid daily  ? Joint pain   ? Obesity (BMI 30.0-34.9)   ? Peripheral arterial disease ([place])   ? Pneumonia   ? as a baby  ? PONV (postoperative nausea and vomiting)   ? Primary localized osteoarthritis of left knee 04/17/2015  ? Primary localized osteoarthritis of right knee   ? knees  ? Stage 3a chronic kidney disease ([place]) 10/14/2021  ? Urinary frequency   ? Urinary urgency   ?  ? ?Past Surgical History:  ?Procedure Laterality Date  ? ABDOMINAL HYSTERECTOMY  90's  ? complete  ? APPENDECTOMY  80's  ? BACK SURGERY    ? CATARACT EXTRACTION W/PHACO Right 12/23/2015  ? Procedure: CATARACT EXTRACTION PHACO AND INTRAOCULAR LENS PLACEMENT RIGHT EYE CDE=6.42;  Surgeon: Williams Che, MD;  [facility]: [facility];  Service: Ophthalmology;  Laterality: Right;  ? COLONOSCOPY  09/09/2004  ? [id number] rectum and colon  ? COLONOSCOPY N/A 11/08/2013  ? Procedure: COLONOSCOPY;  Surgeon: Cristopher Estimable  Rourk, MD;  [facility];  Service: Endoscopy;  Laterality: N/A;  10:45  ? COLONOSCOPY WITH PROPOFOL N/A 06/30/2021  ? Procedure: COLONOSCOPY WITH PROPOFOL;  Surgeon: Daneil Dolin, MD;  [facility];  Service: Endoscopy;  Laterality: N/A;  11:15am  ? JOINT REPLACEMENT    ? 2015, 2016  ? NECK SURGERY  1983  ? RECTOCELE REPAIR  10/2007  ? SPINE SURGERY    ? ruptured disc  ? TOTAL KNEE ARTHROPLASTY Right 04/04/2014  ? DR Noemi Chapel  ? TOTAL KNEE ARTHROPLASTY Right 04/04/2014  ? Procedure: RIGHT TOTAL KNEE ARTHROPLASTY;  Surgeon: Lorn Junes, MD;  Location: [place];  Service: Orthopedics;  Laterality: Right;  ? TOTAL KNEE ARTHROPLASTY Left 04/29/2015  ? Procedure: TOTAL KNEE ARTHROPLASTY;  Surgeon: Elsie Saas, MD;  Location: [place];  Service: Orthopedics;   Laterality: Left;  ? TUBAL LIGATION    ? ? ?Family History  ?Problem Relation Age of Onset  ? Colon cancer Sister   ?     age 42s  ? Myasthenia gravis Sister   ? Bladder Cancer Brother   ? Lung cancer Brother   ?     age 9  ? Alzheimer's disease Mother   ? COPD Father   ? Asthma Father   ? Alcohol abuse Brother   ?     likely died of pneumonia  ? ? ?Social History  ? ?Tobacco Use  ? Smoking status: Never  ? Smokeless tobacco: Never  ?Vaping Use  ? Vaping Use: Never used  ?Substance Use Topics  ? Alcohol use: No  ? Drug use: No  ? ? ?ROS ? ? ?Objective:  ? ?Vitals: ?BP 134/77 (BP Location: Right Arm)   Pulse 61   Temp 98.7 ?F (37.1 ?C) (Oral)   Resp 18   LMP 07/20/1990 (Approximate)   SpO2 95%  ? ?Physical Exam ?Constitutional:   ?   General: She is not in acute distress. ?   Appearance: Normal appearance. She is well-developed. She is not ill-appearing, toxic-appearing or diaphoretic.  ?HENT:  ?   Head: Normocephalic and atraumatic.  ?   Nose: Nose normal.  ?   Mouth/Throat:  ?   Mouth: Mucous membranes are moist.  ?Eyes:  ?   General: No scleral icterus.    ?   Right eye: No discharge.     ?   Left eye: No discharge.  ?   Extraocular Movements: Extraocular movements intact.  ?Cardiovascular:  ?   Rate and Rhythm: Normal rate.  ?Pulmonary:  ?   Effort: Pulmonary effort is normal.  ?Chest:  ? ? ?Skin: ?   General: Skin is warm and dry.  ?Neurological:  ?   General: No focal deficit present.  ?   Mental Status: She is alert and oriented to person, place, and time.  ?Psychiatric:     ?   Mood and Affect: Mood normal.     ?   Behavior: Behavior normal.  ? ? ? ? ?Assessment and Plan :  ? ?PDMP not reviewed this encounter. ? ?1. Herpes zoster without complication   ? ?Physical exam findings consistent with shingles.  Recommended starting Valtrex.  Creatinine clearance was calculated at 57 mL/mL.  Offered gabapentin for neuropathic pain relief.  Patient can use Tylenol as well.  She did not want any stronger pain  medications than that. Counseled patient on potential for adverse effects with medications prescribed/recommended today, ER and return-to-clinic precautions discussed, patient verbalized  understanding. ? ?  ?Jaynee Eagles, PA-C ?10/19/21 1340 ? ?

## 2021-10-19 NOTE — ED Triage Notes (Signed)
Rash on left side of chest that extends to axilla area.  States it hurts to her back ?

## 2021-10-22 ENCOUNTER — Encounter: Payer: Self-pay | Admitting: Internal Medicine

## 2021-11-17 ENCOUNTER — Other Ambulatory Visit: Payer: Self-pay | Admitting: Internal Medicine

## 2021-11-17 DIAGNOSIS — I10 Essential (primary) hypertension: Secondary | ICD-10-CM

## 2022-01-01 ENCOUNTER — Ambulatory Visit: Payer: Medicare PPO | Admitting: Gastroenterology

## 2022-01-06 ENCOUNTER — Encounter: Payer: Self-pay | Admitting: Gastroenterology

## 2022-01-06 NOTE — Progress Notes (Signed)
Referring Provider: Lindell Spar, MD Primary Care Physician:  Lindell Spar, MD Primary GI Physician: Dr. Gala Romney  No chief complaint on file.   HPI:   [name] is a 81 y.o. female with history of chronic constipation, rectal bleeding, family history of colon cancer at advanced age, presenting today for follow-up of change in bowel habits, rectal bleeding s/p colonoscopy.  She was last seen in our office 05/27/2021.  She reported lifelong tendency toward constipation, but insidiously worse over the past year or so using milk of magnesia on a regular basis and enemas a couple times a week.  Occasional bright red blood per rectum with straining.  She was scheduled for colonoscopy.  Colonoscopy completed 06/30/2021 revealing diverticulosis in the sigmoid colon, descending colon, transverse colon, nonbleeding internal hemorrhoids, otherwise normal exam.  Noted since patient had started back on MiraLAX, her bleeding had resolved and bowel function had normalized.  No recommendations to repeat colonoscopy due to age.  Recommended continuing MiraLAX nightly as needed to achieve at least 3 bowel movements weekly.   Today:    Past Medical History:  Diagnosis Date   Allergy    Anemia    prior to hysterectomy   Back pain    comepensating for knee pain per pt   Cataract    right eye and immature   Complication of anesthesia    Constipation    takes Colace daily and Milk of Mag   CTS (carpal tunnel syndrome) 08/31/2016   Dizziness    but goes away real quick   Dry eyes    uses Eye Drops daily as needed   Female hypogonadism syndrome    GERD (gastroesophageal reflux disease)    History of colon polyps    Hyperlipidemia    was on meds but stopped taking back in June 2015   Hypertension    takes Lisinopril daily   Hypothyroidism    takes Synthroid daily   Joint pain    Obesity (BMI 30.0-34.9)    Peripheral arterial disease ([facility])    Pneumonia    as a baby   PONV  (postoperative nausea and vomiting)    Primary localized osteoarthritis of left knee 04/17/2015   Primary localized osteoarthritis of right knee    knees   Stage 3a chronic kidney disease ([place]) 10/14/2021   Urinary frequency    Urinary urgency     Past Surgical History:  Procedure Laterality Date   ABDOMINAL HYSTERECTOMY  90's   complete   APPENDECTOMY  80's   BACK SURGERY     CATARACT EXTRACTION W/PHACO Right 12/23/2015   Procedure: CATARACT EXTRACTION PHACO AND INTRAOCULAR LENS PLACEMENT RIGHT EYE CDE=6.42;  Surgeon: Williams Che, MD;  [facility];  Service: Ophthalmology;  Laterality: Right;   COLONOSCOPY  09/09/2004   [id number] rectum and colon   COLONOSCOPY N/A 11/08/2013   Procedure: COLONOSCOPY;  Surgeon: Daneil Dolin, MD;  [facility];  Service: Endoscopy;  Laterality: N/A;  10:45   COLONOSCOPY WITH PROPOFOL N/A 06/30/2021   Procedure: COLONOSCOPY WITH PROPOFOL;  Surgeon: Daneil Dolin, MD;  [facility];  Service: Endoscopy;  Laterality: N/A;  11:15am   JOINT REPLACEMENT     2015, 2016   NECK SURGERY  1983   RECTOCELE REPAIR  10/2007   SPINE SURGERY     ruptured disc   TOTAL KNEE ARTHROPLASTY Right 04/04/2014   DR Noemi Chapel   TOTAL KNEE ARTHROPLASTY Right 04/04/2014  Procedure: RIGHT TOTAL KNEE ARTHROPLASTY;  Surgeon: Lorn Junes, MD;  Location: [place];  Service: Orthopedics;  Laterality: Right;   TOTAL KNEE ARTHROPLASTY Left 04/29/2015   Procedure: TOTAL KNEE ARTHROPLASTY;  Surgeon: Elsie Saas, MD;  Location: [place];  Service: Orthopedics;  Laterality: Left;   TUBAL LIGATION      Current Outpatient Medications  Medication Sig Dispense Refill   acetaminophen (TYLENOL) 500 MG tablet Take 500 mg by mouth 2 (two) times daily as needed for moderate pain.     aspirin EC 81 MG tablet Take 81 mg by mouth daily.     b complex vitamins capsule Take 1 capsule by mouth daily.     Cholecalciferol (VITAMIN D) 50 MCG (2000 UT) tablet Take 2,000  Units by mouth daily.     gabapentin (NEURONTIN) 100 MG capsule Take 1 capsule (100 mg total) by mouth 3 (three) times daily. 30 capsule 0   levothyroxine (SYNTHROID) 50 MCG tablet Take 1 tablet (50 mcg total) by mouth daily. 90 tablet 0   lisinopril (ZESTRIL) 10 MG tablet TAKE ONE TABLET (10MG TOTAL) BY MOUTH DAILY 90 tablet 0   magnesium hydroxide (MILK OF MAGNESIA) 400 MG/5ML suspension Take 15 mLs by mouth daily as needed for moderate constipation.     polyethylene glycol-electrolytes (TRILYTE) 420 g solution Take 4,000 mLs by mouth as directed. 4000 mL 0   valACYclovir (VALTREX) 1000 MG tablet Take 1 tablet (1,000 mg total) by mouth 3 (three) times daily. 30 tablet 0   No current facility-administered medications for this visit.    Allergies as of 01/07/2022 - Review Complete 10/19/2021  Allergen Reaction Noted   Codeine Nausea And Vomiting 03/21/2014   Crestor [rosuvastatin] Other (See Comments) 01/04/2017   Flexall [menthol (topical analgesic)] Rash 03/21/2014   Penicillins Rash 11/17/2012    Family History  Problem Relation Age of Onset   Colon cancer Sister        age 26s   Myasthenia gravis Sister    Bladder Cancer Brother    Lung cancer Brother        age 83   Alzheimer's disease Mother    COPD Father    Asthma Father    Alcohol abuse Brother        likely died of pneumonia    Social History   Socioeconomic History   Marital status: Widowed    Spouse name: Not on file   Number of children: 2   Years of education: 71   Highest education level: Not on file  Occupational History   Occupation: retired    Comment: Doctor, general practice  Tobacco Use   Smoking status: Never   Smokeless tobacco: Never  Scientific laboratory technician Use: Never used  Substance and Sexual Activity   Alcohol use: No   Drug use: No   Sexual activity: Never    Birth control/protection: Surgical  Other Topics Concern   Not on file  Social History Narrative   Husband died after over 69 years  and then a year after husband died, she met a man who she had a relationship with for over ten years. He died in 2017/09/27 after long illness.   Live alone.    Has two children, grown sons.    Gets out with neices, enjoys going out to eat, going to [employer].   Goes to church.    Eats all food groups, eats some meat.       Social Determinants of Health  Financial Resource Strain: Low Risk  (05/21/2021)   Overall Financial Resource Strain (CARDIA)    Difficulty of Paying Living Expenses: Not hard at all  Food Insecurity: No Food Insecurity (05/21/2021)   Hunger Vital Sign    Worried About Running Out of Food in the Last Year: Never true    Ran Out of Food in the Last Year: Never true  Transportation Needs: No Transportation Needs (05/21/2021)   PRAPARE - Hydrologist (Medical): No    Lack of Transportation (Non-Medical): No  Physical Activity: Inactive (05/21/2021)   Exercise Vital Sign    Days of Exercise per Week: 0 days    Minutes of Exercise per Session: 0 min  Stress: No Stress Concern Present (05/21/2021)   [place]    Feeling of Stress : Only a little  Social Connections: Socially Isolated (05/21/2021)   Social Connection and Isolation Panel [NHANES]    Frequency of Communication with Friends and Family: More than three times a week    Frequency of Social Gatherings with Friends and Family: Three times a week    Attends Religious Services: Never    Active Member of Clubs or Organizations: No    Attends Archivist Meetings: Never    Marital Status: Widowed    Review of Systems: Gen: Denies fever, chills, cold or flulike symptoms, presyncope, syncope. CV: Denies chest pain, palpitations. Resp: Denies dyspnea at rest, cough. GI: See HPI Heme: See HPI  Physical Exam: LMP 07/20/1990 (Approximate)  General:   Alert and oriented. No distress noted. Pleasant and  cooperative.  Head:  Normocephalic and atraumatic. Eyes:  Conjuctiva clear without scleral icterus. Heart:  S1, S2 present without murmurs appreciated. Lungs:  Clear to auscultation bilaterally. No wheezes, rales, or rhonchi. No distress.  Abdomen:  +BS, soft, non-tender and non-distended. No rebound or guarding. No HSM or masses noted. Msk:  Symmetrical without gross deformities. Normal posture. Extremities:  Without edema. Neurologic:  Alert and  oriented x4 Psych:  Normal mood and affect.    Assessment:     Plan:  ***   Aliene Altes, PA-C [facility] Gastroenter[facility] 01/07/2022

## 2022-01-07 ENCOUNTER — Ambulatory Visit: Payer: Medicare PPO | Admitting: Gastroenterology

## 2022-01-07 ENCOUNTER — Encounter: Payer: Self-pay | Admitting: Gastroenterology

## 2022-01-07 VITALS — BP 124/72 | HR 66 | Temp 97.6°F | Ht 63.0 in | Wt 174.6 lb

## 2022-01-07 DIAGNOSIS — K5909 Other constipation: Secondary | ICD-10-CM

## 2022-01-07 DIAGNOSIS — K649 Unspecified hemorrhoids: Secondary | ICD-10-CM | POA: Diagnosis not present

## 2022-01-07 DIAGNOSIS — K625 Hemorrhage of anus and rectum: Secondary | ICD-10-CM | POA: Diagnosis not present

## 2022-01-07 NOTE — Patient Instructions (Signed)
For constipation: Continue taking 1-2 stool softeners daily. Continue MiraLAX 1 capful (17 g) in 8 ounces of water as needed.  You may take MiraLAX every day if needed.  Your occasional rectal bleeding is secondary to internal hemorrhoids. It is important that we keep your bowels moving well to prevent flares of hemorrhoids. If you do experience an episode of rectal bleeding or rectal discomfort, you can use Preparation H for this.  We will plan to follow-up with you in 1 year.  Do not hesitate to call if you have any questions or concerns prior to your next visit.  It was very nice meeting you today!  Aliene Altes, PA-C [facility]

## 2022-01-12 ENCOUNTER — Other Ambulatory Visit: Payer: Self-pay | Admitting: Internal Medicine

## 2022-01-12 DIAGNOSIS — E039 Hypothyroidism, unspecified: Secondary | ICD-10-CM

## 2022-01-14 ENCOUNTER — Other Ambulatory Visit (HOSPITAL_COMMUNITY): Payer: Self-pay | Admitting: Internal Medicine

## 2022-01-14 DIAGNOSIS — Z1231 Encounter for screening mammogram for malignant neoplasm of breast: Secondary | ICD-10-CM

## 2022-01-15 DIAGNOSIS — H25812 Combined forms of age-related cataract, left eye: Secondary | ICD-10-CM | POA: Diagnosis not present

## 2022-01-21 ENCOUNTER — Ambulatory Visit (HOSPITAL_COMMUNITY)
Admission: RE | Admit: 2022-01-21 | Discharge: 2022-01-21 | Disposition: A | Discharge status: Home / Self Care | Payer: Medicare PPO | Source: Ambulatory Visit | Attending: Internal Medicine | Admitting: Internal Medicine

## 2022-01-21 DIAGNOSIS — Z1231 Encounter for screening mammogram for malignant neoplasm of breast: Secondary | ICD-10-CM | POA: Insufficient documentation

## 2022-02-06 DIAGNOSIS — N1831 Chronic kidney disease, stage 3a: Secondary | ICD-10-CM | POA: Diagnosis not present

## 2022-02-06 DIAGNOSIS — E039 Hypothyroidism, unspecified: Secondary | ICD-10-CM | POA: Diagnosis not present

## 2022-02-07 LAB — BASIC METABOLIC PANEL
BUN/Creatinine Ratio: 16 (ref 12–28)
BUN: 16 mg/dL (ref 8–27)
CO2: 23 mmol/L (ref 20–29)
Calcium: 9.5 mg/dL (ref 8.7–10.3)
Chloride: 102 mmol/L (ref 96–106)
Creatinine, Ser: 1.01 mg/dL — ABNORMAL HIGH (ref 0.57–1.00)
Glucose: 93 mg/dL (ref 70–99)
Potassium: 4.5 mmol/L (ref 3.5–5.2)
Sodium: 138 mmol/L (ref 134–144)
eGFR: 57 mL/min/{1.73_m2} — ABNORMAL LOW (ref >59)

## 2022-02-07 LAB — TSH+FREE T4
Free T4: 1.01 ng/dL (ref 0.82–1.77)
TSH: 19 u[IU]/mL — ABNORMAL HIGH (ref 0.450–4.500)

## 2022-02-13 ENCOUNTER — Ambulatory Visit: Payer: Medicare PPO | Admitting: Internal Medicine

## 2022-02-13 ENCOUNTER — Encounter: Payer: Self-pay | Admitting: Internal Medicine

## 2022-02-13 VITALS — BP 108/82 | HR 56 | Resp 18 | Ht 63.0 in | Wt 172.4 lb

## 2022-02-13 DIAGNOSIS — N1831 Chronic kidney disease, stage 3a: Secondary | ICD-10-CM

## 2022-02-13 DIAGNOSIS — E669 Obesity, unspecified: Secondary | ICD-10-CM | POA: Diagnosis not present

## 2022-02-13 DIAGNOSIS — E039 Hypothyroidism, unspecified: Secondary | ICD-10-CM

## 2022-02-13 DIAGNOSIS — I10 Essential (primary) hypertension: Secondary | ICD-10-CM

## 2022-02-13 DIAGNOSIS — K219 Gastro-esophageal reflux disease without esophagitis: Secondary | ICD-10-CM

## 2022-02-13 DIAGNOSIS — E559 Vitamin D deficiency, unspecified: Secondary | ICD-10-CM

## 2022-02-13 DIAGNOSIS — E782 Mixed hyperlipidemia: Secondary | ICD-10-CM

## 2022-02-13 DIAGNOSIS — R739 Hyperglycemia, unspecified: Secondary | ICD-10-CM

## 2022-02-13 MED ORDER — LEVOTHYROXINE SODIUM 75 MCG PO TABS: 75.0000 ug | ORAL_TABLET | Freq: Every day | ORAL | 1 refills | Status: DC

## 2022-02-13 NOTE — Assessment & Plan Note (Signed)
Likely due to inadequate fluid intake and/or age related decline Has h/o horseshoe kidney On ACEi Avoid nephrotoxic agents Advised to increase fluid intake Check BMP 

## 2022-02-13 NOTE — Assessment & Plan Note (Addendum)
Her epigastric pain likely due to GERD/gastritis Advised to take Pepcid as needed Avoid hot and spicy food, and caffeinated products

## 2022-02-13 NOTE — Assessment & Plan Note (Signed)
Lab Results  Component Value Date   TSH 19.000 (H) 02/06/2022   Increased compared to prior On levothyroxine dose 50 mcg QD now, increased dose to 75 mcg QD Check TSH and free T4 - if persistently elevated TSH, will increase dose of Levothyroxine

## 2022-02-13 NOTE — Patient Instructions (Signed)
Please start taking Levothyroxine 75 mcg once daily instead of 50 mcg.  Please continue taking other medications as prescribed.  Please continue to follow low salt diet and ambulate as tolerated.

## 2022-02-13 NOTE — Assessment & Plan Note (Signed)
BP Readings from Last 1 Encounters:  02/13/22 108/82   Well-controlled Counseled for compliance with the medications Advised DASH diet and moderate exercise/walking, at least 150 mins/week

## 2022-02-13 NOTE — Assessment & Plan Note (Signed)
Last vitamin D Lab Results  Component Value Date   VD25OH 68 01/06/2021   Had advised to decrease vitamin D to 2000 IU daily

## 2022-02-13 NOTE — Assessment & Plan Note (Signed)
Recent weight gain likely due to on uncontrolled hypothyroidism Adjusted dose of levothyroxine Continue to follow low-carb diet

## 2022-02-13 NOTE — Progress Notes (Signed)
Established Patient Office Visit  Subjective:  Patient ID: [name], female    DOB: 05/05/1941  Age: 81 y.o. MRN: [id number]  CC:  Chief Complaint  Patient presents with   Follow-up    Follow up hypothyroidism     HPI [name] is a 81 y.o. female with past medical history of HTN, hypothyroidism and vitamin D deficiency who presents for f/u of her chronic medical conditions.  Hypothyroidism: She has started taking levothyroxine 50 mcg instead of NP thyroid.  Her TSH is still elevated now, as we had tried to lower dose of thyroid hormone as it was oversupplemented in the past.  She had gained 7 pounds in the last visit, but has been stable since increasing dose of levothyroxine to 50 mcg daily. She does report fatigue and feeling sluggish in the morning. Of note, her jitteriness/anxiety and palpitations have resolved now since switching from NP thyroid.  Her BMP showed GFR of 57, stable.  She denies any dysuria, hematuria, urinary hesitance or resistance.  She agrees to improve fluid intake.  She complains of epigastric pain, radiating to her neck area.  It improves with Alka-Seltzer plus.  Denies any nausea or vomiting currently.   Past Medical History:  Diagnosis Date   Allergy    Anemia    prior to hysterectomy   Back pain    comepensating for knee pain per pt   Cataract    right eye and immature   Complication of anesthesia    Constipation    takes Colace daily and Milk of Mag   CTS (carpal tunnel syndrome) 08/31/2016   Dizziness    but goes away real quick   Dry eyes    uses Eye Drops daily as needed   Female hypogonadism syndrome    GERD (gastroesophageal reflux disease)    History of colon polyps    Hyperlipidemia    was on meds but stopped taking back in June 2015   Hypertension    takes Lisinopril daily   Hypothyroidism    takes Synthroid daily   Joint pain    Obesity (BMI 30.0-34.9)    Peripheral arterial disease (HCC)    Pneumonia    as a baby    PONV (postoperative nausea and vomiting)    Primary localized osteoarthritis of left knee 04/17/2015   Primary localized osteoarthritis of right knee    knees   Stage 3a chronic kidney disease ([place]) 10/14/2021   Urinary frequency    Urinary urgency     Past Surgical History:  Procedure Laterality Date   ABDOMINAL HYSTERECTOMY  90's   complete   APPENDECTOMY  80's   BACK SURGERY     CATARACT EXTRACTION W/PHACO Right 12/23/2015   Procedure: CATARACT EXTRACTION PHACO AND INTRAOCULAR LENS PLACEMENT RIGHT EYE CDE=6.42;  Surgeon: Williams Che, MD;  Location: [facility];  Service: Ophthalmology;  Laterality: Right;   COLONOSCOPY  09/09/2004   VWP:VXYIAX rectum and colon   COLONOSCOPY N/A 11/08/2013   Procedure: COLONOSCOPY;  Surgeon: Daneil Dolin, MD;  Location: [facility];  Service: Endoscopy;  Laterality: N/A;  10:45   COLONOSCOPY WITH PROPOFOL N/A 06/30/2021   Surgeon: Daneil Dolin, MD; diverticulosis in sigmoid, descending, transverse colon, nonbleeding internal hemorrhoids, otherwise normal exam.  No repeat due to age.   JOINT REPLACEMENT     2015, 2016   [place] REPAIR  10/2007   SPINE SURGERY  ruptured disc   TOTAL KNEE ARTHROPLASTY Right 04/04/2014   DR Noemi Chapel   TOTAL KNEE ARTHROPLASTY Right 04/04/2014   Procedure: RIGHT TOTAL KNEE ARTHROPLASTY;  Surgeon: Lorn Junes, MD;  Location: [place];  Service: Orthopedics;  Laterality: Right;   TOTAL KNEE ARTHROPLASTY Left 04/29/2015   Procedure: TOTAL KNEE ARTHROPLASTY;  Surgeon: Elsie Saas, MD;  Location: [facility];  Service: Orthopedics;  Laterality: Left;   TUBAL LIGATION      Family History  Problem Relation Age of Onset   Colon cancer Sister        age 18s   Myasthenia gravis Sister    Bladder Cancer Brother    Lung cancer Brother        age 58   Alzheimer's disease Mother    COPD Father    Asthma Father    Alcohol abuse Brother        likely died of pneumonia    Social History    Socioeconomic History   Marital status: Widowed    Spouse name: Not on file   Number of children: 2   Years of education: 55   Highest education level: Not on file  Occupational History   Occupation: retired    Comment: Doctor, general practice  Tobacco Use   Smoking status: Never   Smokeless tobacco: Never  Scientific laboratory technician Use: Never used  Substance and Sexual Activity   Alcohol use: No   Drug use: No   Sexual activity: Never    Birth control/protection: Surgical  Other Topics Concern   Not on file  Social History Narrative   Husband died after over 46 years and then a year after husband died, she met a man who she had a relationship with for over ten years. He died in 2017/10/09 after long illness.   Live alone.    Has two children, grown sons.    Gets out with neices, enjoys going out to eat, going to [employer].   Goes to church.    Eats all food groups, eats some meat.       Social Determinants of Health   Financial Resource Strain: Low Risk  (05/21/2021)   Overall Financial Resource Strain (CARDIA)    Difficulty of Paying Living Expenses: Not hard at all  Food Insecurity: No Food Insecurity (05/21/2021)   Hunger Vital Sign    Worried About Running Out of Food in the Last Year: Never true    Ran Out of Food in the Last Year: Never true  Transportation Needs: No Transportation Needs (05/21/2021)   PRAPARE - Hydrologist (Medical): No    Lack of Transportation (Non-Medical): No  Physical Activity: Inactive (05/21/2021)   Exercise Vital Sign    Days of Exercise per Week: 0 days    Minutes of Exercise per Session: 0 min  Stress: No Stress Concern Present (05/21/2021)   [place]    Feeling of Stress : Only a little  Social Connections: Socially Isolated (05/21/2021)   Social Connection and Isolation Panel [NHANES]    Frequency of Communication with Friends and Family: More  than three times a week    Frequency of Social Gatherings with Friends and Family: Three times a week    Attends Religious Services: Never    Active Member of Clubs or Organizations: No    Attends Archivist Meetings: Never    Marital Status: Widowed  Intimate  Partner Violence: Not At Risk (05/21/2021)   Humiliation, Afraid, Rape, and Kick questionnaire    Fear of Current or Ex-Partner: No    Emotionally Abused: No    Physically Abused: No    Sexually Abused: No    Outpatient Medications Prior to Visit  Medication Sig Dispense Refill   acetaminophen (TYLENOL) 500 MG tablet Take 500 mg by mouth 2 (two) times daily as needed for moderate pain.     aspirin EC 81 MG tablet Take 81 mg by mouth daily.     b complex vitamins capsule Take 1 capsule by mouth daily.     Cholecalciferol (VITAMIN D) 50 MCG (2000 UT) tablet Take 2,000 Units by mouth daily.     lisinopril (ZESTRIL) 10 MG tablet TAKE ONE TABLET (10MG TOTAL) BY MOUTH DAILY 90 tablet 0   magnesium hydroxide (MILK OF MAGNESIA) 400 MG/5ML suspension Take 15 mLs by mouth daily as needed for moderate constipation.     levothyroxine (SYNTHROID) 50 MCG tablet TAKE ONE TABLET (50MCG TOTAL) BY MOUTH DAILY 90 tablet 0   No facility-administered medications prior to visit.    Allergies  Allergen Reactions   Codeine Nausea And Vomiting   Crestor [Rosuvastatin] Other (See Comments)    Body aches   Flexall [Menthol (Topical Analgesic)] Rash   Penicillins Rash    ROS Review of Systems  Constitutional:  Positive for fatigue. Negative for chills and fever.  HENT:  Negative for congestion, sinus pressure, sinus pain and sore throat.   Eyes:  Negative for pain and discharge.  Respiratory:  Negative for cough and shortness of breath.   Cardiovascular:  Negative for chest pain and palpitations.  Gastrointestinal:  Negative for abdominal pain, diarrhea, nausea and vomiting.  Endocrine: Negative for polydipsia and polyuria.   Genitourinary:  Negative for dysuria and hematuria.  Musculoskeletal:  Positive for arthralgias and back pain. Negative for neck pain and neck stiffness.  Skin:  Negative for rash.  Neurological:  Positive for numbness. Negative for dizziness and weakness.  Psychiatric/Behavioral:  Negative for agitation and behavioral problems. The patient is not nervous/anxious.       Objective:    Physical Exam Vitals reviewed.  Constitutional:      General: She is not in acute distress.    Appearance: She is not diaphoretic.  HENT:     Head: Normocephalic and atraumatic.     Nose: Nose normal.     Mouth/Throat:     Mouth: Mucous membranes are moist.  Eyes:     General: No scleral icterus.    Extraocular Movements: Extraocular movements intact.  Cardiovascular:     Rate and Rhythm: Normal rate and regular rhythm.     Pulses: Normal pulses.     Heart sounds: Normal heart sounds. No murmur heard. Pulmonary:     Breath sounds: Normal breath sounds. No wheezing or rales.  Abdominal:     Palpations: Abdomen is soft.     Tenderness: There is no abdominal tenderness.  Musculoskeletal:     Cervical back: Neck supple. No tenderness.     Right lower leg: No edema.     Left lower leg: No edema.  Skin:    General: Skin is warm.     Findings: No rash.  Neurological:     General: No focal deficit present.     Mental Status: She is alert and oriented to person, place, and time.     Cranial Nerves: No cranial nerve deficit.  Sensory: No sensory deficit.     Motor: No weakness.  Psychiatric:        Mood and Affect: Mood normal.        Behavior: Behavior normal.     BP 108/82 (BP Location: Right Arm, Patient Position: Sitting, Cuff Size: Normal)   Pulse (!) 56   Resp 18   Ht '5\' 3"'  (1.6 m)   Wt 172 lb 6.4 oz (78.2 kg)   LMP 07/20/1990 (Approximate)   SpO2 96%   BMI 30.54 kg/m  Wt Readings from Last 3 Encounters:  02/13/22 172 lb 6.4 oz (78.2 kg)  01/07/22 174 lb 9.6 oz (79.2 kg)   10/14/21 171 lb 1.9 oz (77.6 kg)    Lab Results  Component Value Date   TSH 19.000 (H) 02/06/2022   Lab Results  Component Value Date   WBC 6.2 07/07/2021   HGB 13.7 07/07/2021   HCT 41.2 07/07/2021   MCV 87 07/07/2021   PLT 226 07/07/2021   Lab Results  Component Value Date   NA 138 02/06/2022   K 4.5 02/06/2022   CO2 23 02/06/2022   GLUCOSE 93 02/06/2022   BUN 16 02/06/2022   CREATININE 1.01 (H) 02/06/2022   BILITOT 0.3 07/07/2021   ALKPHOS 78 07/07/2021   AST 20 07/07/2021   ALT 11 07/07/2021   PROT 6.5 07/07/2021   ALBUMIN 4.3 07/07/2021   CALCIUM 9.5 02/06/2022   ANIONGAP 13 05/01/2015   EGFR 57 (L) 02/06/2022   Lab Results  Component Value Date   CHOL 232 (H) 07/07/2021   Lab Results  Component Value Date   HDL 50 07/07/2021   Lab Results  Component Value Date   LDLCALC 160 (H) 07/07/2021   Lab Results  Component Value Date   TRIG 125 07/07/2021   Lab Results  Component Value Date   CHOLHDL 4.6 (H) 07/07/2021   No results found for: "HGBA1C"    Assessment & Plan:   Problem List Items Addressed This Visit       Cardiovascular and Mediastinum   Hypertension    BP Readings from Last 1 Encounters:  02/13/22 108/82  Well-controlled Counseled for compliance with the medications Advised DASH diet and moderate exercise/walking, at least 150 mins/week        Digestive   Gastroesophageal reflux disease without esophagitis    Her epigastric pain likely due to GERD/gastritis Advised to take Pepcid as needed Avoid hot and spicy food, and caffeinated products        Endocrine   Hypothyroidism - Primary    Lab Results  Component Value Date   TSH 19.000 (H) 02/06/2022  Increased compared to prior On levothyroxine dose 50 mcg QD now, increased dose to 75 mcg QD Check TSH and free T4 - if persistently elevated TSH, will increase dose of Levothyroxine      Relevant Medications   levothyroxine (SYNTHROID) 75 MCG tablet   Other Relevant  Orders   TSH + free T4     Genitourinary   Stage 3a chronic kidney disease ([place])    Likely due to inadequate fluid intake and/or age related decline Has h/o horseshoe kidney On ACEi Avoid nephrotoxic agents Advised to increase fluid intake Check BMP      Relevant Orders   VITAMIN D 25 Hydroxy (Vit-D Deficiency, Fractures)   CMP14+EGFR   CBC with Differential/Platelet     Other   Hyperlipidemia   Relevant Orders   Lipid panel   Vitamin D deficiency  Last vitamin D Lab Results  Component Value Date   VD25OH 4 01/06/2021  Had advised to decrease vitamin D to 2000 IU daily      Obesity (BMI 30.0-34.9)    Recent weight gain likely due to on uncontrolled hypothyroidism Adjusted dose of levothyroxine Continue to follow low-carb diet      Other Visit Diagnoses     Hyperglycemia       Relevant Orders   Hemoglobin A1c       Meds ordered this encounter  Medications   levothyroxine (SYNTHROID) 75 MCG tablet    Sig: Take 1 tablet (75 mcg total) by mouth daily before breakfast.    Dispense:  90 tablet    Refill:  1    Dose change    Follow-up: Return in about 4 months (around 06/16/2022) for Annual physical (after 12/28).    Lindell Spar, MD

## 2022-02-16 ENCOUNTER — Other Ambulatory Visit: Payer: Self-pay | Admitting: Internal Medicine

## 2022-02-16 DIAGNOSIS — I10 Essential (primary) hypertension: Secondary | ICD-10-CM

## 2022-04-16 DIAGNOSIS — H01001 Unspecified blepharitis right upper eyelid: Secondary | ICD-10-CM | POA: Diagnosis not present

## 2022-04-16 DIAGNOSIS — H01004 Unspecified blepharitis left upper eyelid: Secondary | ICD-10-CM | POA: Diagnosis not present

## 2022-04-16 DIAGNOSIS — H01002 Unspecified blepharitis right lower eyelid: Secondary | ICD-10-CM | POA: Diagnosis not present

## 2022-04-16 DIAGNOSIS — H2512 Age-related nuclear cataract, left eye: Secondary | ICD-10-CM | POA: Diagnosis not present

## 2022-05-04 DIAGNOSIS — H25812 Combined forms of age-related cataract, left eye: Secondary | ICD-10-CM | POA: Diagnosis not present

## 2022-05-05 ENCOUNTER — Encounter (HOSPITAL_COMMUNITY)
Admission: RE | Admit: 2022-05-05 | Discharge: 2022-05-05 | Disposition: A | Discharge status: Home / Self Care | Payer: Medicare PPO | Source: Ambulatory Visit | Attending: Ophthalmology | Admitting: Ophthalmology

## 2022-05-05 NOTE — H&P (Signed)
Surgical History & Physical  Patient Name: [name] DOB: 11-08-1940  Surgery: Cataract extraction with intraocular lens implant phacoemulsification; Left Eye  Surgeon: Baruch Goldmann MD Surgery Date:  05-11-22 Pre-Op Date:  04-30-22  HPI: A 37 Yr. old female patient 1. 1. The patient is here for a cataract evaluation of the left eye, referred from Dr. Jorja Loa. S/p cataract surgery in the right eye in 2017 with Dr. Iona Hansen. The patient's vision is blurry. The complaint is associated with difficulty reading small print on medicine bottles/books/labels, difficulty with glare on bright sunny days, and difficulty driving at night due to halos/glare. This is negatively affecting the patient's quality of life and the patient is unable to function adequately in life with the current level of vision. The patient also has concerns with her right eye, she reports the vision is just not clear/difficulty to preform daily tasks. Most of it has to do with her eyelids dropping. She would like to discuss surgery options for that as well. HPI Completed by Dr. Baruch Goldmann  Medical History: High Blood Pressure LDL  Review of Systems Negative Allergic/Immunologic Negative Cardiovascular Negative Constitutional Negative Ear, Nose, Mouth & Throat Negative Endocrine Negative Eyes Negative Gastrointestinal Negative Genitourinary Negative Hemotologic/Lymphatic Negative Integumentary Negative Musculoskeletal Negative Neurological Negative Psychiatry Negative Respiratory  Social   Never smoked   Medication Systane Complete, Hypochlor for eyelids and eyelashes spray,  Acetaminophen, Aspirin, B complex vitamins, Levothyroxine, Lisinopril, Magnesium hydroxide, Vitamin D,   Sx/Procedures Cataract Surgery,  Appendectomy, Neck surgery, Back Surgery, Knee Surgery,   Drug Allergies  Codeine, PCN, Pain meds,   History & Physical: Heent: Cataract, left eye NECK: supple without bruits LUNGS: lungs clear  to auscultation CV: regular rate and rhythm Abdomen: soft and non-tender Impression & Plan: Assessment: 1.  NUCLEAR SCLEROSIS AGE RELATED; Left Eye (H25.12) 2.  BLEPHARITIS; Right Upper Lid, Right Lower Lid, Left Upper Lid, Left Lower Lid (H01.001, H01.002,H01.004,H01.005) 3.  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  PCO; Right Eye (H26.491) 5.  INTRAOCULAR LENS IOL ; Right Eye (Z96.1)  Plan: 1.  Cataract accounts for the patient's decreased vision. This visual impairment is not correctable with a tolerable change in glasses or contact lenses. Cataract surgery with an implantation of a new lens should significantly improve the visual and functional status of the patient. Discussed all risks, benefits, alternatives, and potential complications. Discussed the procedures and recovery. Patient desires to have surgery. A-scan ordered and performed today for intra-ocular lens calculations. The surgery will be performed in order to improve vision for driving, reading, and for eye examinations. Recommend phacoemulsification with intra-ocular lens. Recommend Dextenza for post-operative pain and inflammation. Left Eye. Surgery required to correct imbalance of vision. Dilates well - shugarcaine by protocol. Goal of -2.50. SN60WF to balance.  2.  Recommend regular lid cleaning.  3.  Will refer to Dr. Leonard Schwartz after cataract surgery.  4.  Asymptomatic. Findings, prognosis and treatment options reviewed. No indication for laser at this point, will observe for changes.  5.  Set for intermediate? Will need to balance vision. Goal for OS will be -2.50 SN60WF +17.0

## 2022-05-11 ENCOUNTER — Ambulatory Visit (HOSPITAL_COMMUNITY): Payer: Medicare PPO | Admitting: Anesthesiology

## 2022-05-11 ENCOUNTER — Encounter (HOSPITAL_COMMUNITY): Payer: Self-pay | Admitting: Ophthalmology

## 2022-05-11 ENCOUNTER — Encounter (HOSPITAL_COMMUNITY)
Admission: RE | Disposition: A | Discharge status: Home / Self Care | Payer: Self-pay | Source: Ambulatory Visit | Attending: Ophthalmology

## 2022-05-11 ENCOUNTER — Ambulatory Visit (HOSPITAL_COMMUNITY)
Admission: RE | Admit: 2022-05-11 | Discharge: 2022-05-11 | Disposition: A | Discharge status: Home / Self Care | Payer: Medicare PPO | Source: Ambulatory Visit | Attending: Ophthalmology | Admitting: Ophthalmology

## 2022-05-11 ENCOUNTER — Ambulatory Visit (HOSPITAL_BASED_OUTPATIENT_CLINIC_OR_DEPARTMENT_OTHER): Payer: Medicare PPO | Admitting: Anesthesiology

## 2022-05-11 DIAGNOSIS — H25812 Combined forms of age-related cataract, left eye: Secondary | ICD-10-CM | POA: Insufficient documentation

## 2022-05-11 DIAGNOSIS — H0100B Unspecified blepharitis left eye, upper and lower eyelids: Secondary | ICD-10-CM | POA: Insufficient documentation

## 2022-05-11 DIAGNOSIS — H0100A Unspecified blepharitis right eye, upper and lower eyelids: Secondary | ICD-10-CM | POA: Insufficient documentation

## 2022-05-11 DIAGNOSIS — N189 Chronic kidney disease, unspecified: Secondary | ICD-10-CM | POA: Diagnosis not present

## 2022-05-11 DIAGNOSIS — Z961 Presence of intraocular lens: Secondary | ICD-10-CM | POA: Insufficient documentation

## 2022-05-11 DIAGNOSIS — I129 Hypertensive chronic kidney disease with stage 1 through stage 4 chronic kidney disease, or unspecified chronic kidney disease: Secondary | ICD-10-CM | POA: Diagnosis not present

## 2022-05-11 DIAGNOSIS — E039 Hypothyroidism, unspecified: Secondary | ICD-10-CM | POA: Insufficient documentation

## 2022-05-11 DIAGNOSIS — Z9841 Cataract extraction status, right eye: Secondary | ICD-10-CM | POA: Insufficient documentation

## 2022-05-11 DIAGNOSIS — H26491 Other secondary cataract, right eye: Secondary | ICD-10-CM | POA: Diagnosis not present

## 2022-05-11 DIAGNOSIS — I739 Peripheral vascular disease, unspecified: Secondary | ICD-10-CM | POA: Diagnosis not present

## 2022-05-11 DIAGNOSIS — Q100 Congenital ptosis: Secondary | ICD-10-CM | POA: Insufficient documentation

## 2022-05-11 DIAGNOSIS — G709 Myoneural disorder, unspecified: Secondary | ICD-10-CM | POA: Insufficient documentation

## 2022-05-11 DIAGNOSIS — I10 Essential (primary) hypertension: Secondary | ICD-10-CM | POA: Diagnosis not present

## 2022-05-11 DIAGNOSIS — D631 Anemia in chronic kidney disease: Secondary | ICD-10-CM | POA: Diagnosis not present

## 2022-05-11 DIAGNOSIS — H2512 Age-related nuclear cataract, left eye: Secondary | ICD-10-CM

## 2022-05-11 HISTORY — PX: CATARACT EXTRACTION W/PHACO: SHX586

## 2022-05-11 SURGERY — PHACOEMULSIFICATION, CATARACT, WITH IOL INSERTION
Anesthesia: Monitor Anesthesia Care | Site: Eye | Laterality: Left

## 2022-05-11 MED ORDER — SODIUM HYALURONATE 10 MG/ML IO SOLUTION
PREFILLED_SYRINGE | INTRAOCULAR | Status: DC | PRN
  Administered 2022-05-11: 0.85 mL via INTRAOCULAR

## 2022-05-11 MED ORDER — STERILE WATER FOR IRRIGATION IR SOLN
Status: DC | PRN
  Administered 2022-05-11: 250 mL

## 2022-05-11 MED ORDER — LIDOCAINE HCL 3.5 % OP GEL
1.0000 | Freq: Once | OPHTHALMIC | Status: AC
  Administered 2022-05-11: 1 via OPHTHALMIC

## 2022-05-11 MED ORDER — TETRACAINE HCL 0.5 % OP SOLN
1.0000 [drp] | OPHTHALMIC | Status: AC | PRN
  Administered 2022-05-11 (×3): 1 [drp] via OPHTHALMIC

## 2022-05-11 MED ORDER — BSS IO SOLN
INTRAOCULAR | Status: DC | PRN
  Administered 2022-05-11: 15 mL via INTRAOCULAR

## 2022-05-11 MED ORDER — EPINEPHRINE PF 1 MG/ML IJ SOLN
INTRAOCULAR | Status: DC | PRN
  Administered 2022-05-11: 500 mL

## 2022-05-11 MED ORDER — SODIUM HYALURONATE 23MG/ML IO SOSY
PREFILLED_SYRINGE | INTRAOCULAR | Status: DC | PRN
  Administered 2022-05-11: 0.6 mL via INTRAOCULAR

## 2022-05-11 MED ORDER — PHENYLEPHRINE HCL 2.5 % OP SOLN
1.0000 [drp] | OPHTHALMIC | Status: AC | PRN
  Administered 2022-05-11 (×3): 1 [drp] via OPHTHALMIC

## 2022-05-11 MED ORDER — EPINEPHRINE PF 1 MG/ML IJ SOLN
INTRAMUSCULAR | Status: AC
  Filled 2022-05-11: qty 2

## 2022-05-11 MED ORDER — POVIDONE-IODINE 5 % OP SOLN
OPHTHALMIC | Status: DC | PRN
  Administered 2022-05-11: 1 via OPHTHALMIC

## 2022-05-11 MED ORDER — MOXIFLOXACIN HCL 0.5 % OP SOLN
OPHTHALMIC | Status: DC | PRN
  Administered 2022-05-11: 0.2 mL via OPHTHALMIC

## 2022-05-11 MED ORDER — MOXIFLOXACIN HCL 5 MG/ML IO SOLN
INTRAOCULAR | Status: AC
  Filled 2022-05-11: qty 1

## 2022-05-11 MED ORDER — MIDAZOLAM HCL 2 MG/2ML IJ SOLN
INTRAMUSCULAR | Status: AC
  Filled 2022-05-11: qty 2

## 2022-05-11 MED ORDER — TROPICAMIDE 1 % OP SOLN
1.0000 [drp] | OPHTHALMIC | Status: AC | PRN
  Administered 2022-05-11 (×3): 1 [drp] via OPHTHALMIC

## 2022-05-11 MED ORDER — LIDOCAINE HCL (PF) 1 % IJ SOLN
INTRAOCULAR | Status: DC | PRN
  Administered 2022-05-11: 1 mL via OPHTHALMIC

## 2022-05-11 SURGICAL SUPPLY — 16 items
CATARACT SUITE SIGHTPATH (MISCELLANEOUS) ×1 IMPLANT
CLOTH BEACON ORANGE TIMEOUT ST (SAFETY) ×2 IMPLANT
DRAPE HALF SHEET 40X57 (DRAPES) IMPLANT
EYE SHIELD UNIVERSAL CLEAR (GAUZE/BANDAGES/DRESSINGS) IMPLANT
FEE CATARACT SUITE SIGHTPATH (MISCELLANEOUS) ×2 IMPLANT
GLOVE BIOGEL PI IND STRL 7.0 (GLOVE) ×4 IMPLANT
GLOVE SS BIOGEL STRL SZ 6.5 (GLOVE) IMPLANT
LENS IOL ACRSF IQ PC 20.5 (Intraocular Lens) IMPLANT
LENS IOL ACRYSOF IQ POST 20.5 (Intraocular Lens) ×1 IMPLANT
NDL HYPO 18GX1.5 BLUNT FILL (NEEDLE) ×2 IMPLANT
NEEDLE HYPO 18GX1.5 BLUNT FILL (NEEDLE) ×1 IMPLANT
PAD ARMBOARD 7.5X6 YLW CONV (MISCELLANEOUS) ×2 IMPLANT
SYR TB 1ML LL NO SAFETY (SYRINGE) ×2 IMPLANT
TAPE SURG TRANSPORE 1 IN (GAUZE/BANDAGES/DRESSINGS) IMPLANT
TAPE SURGICAL TRANSPORE 1 IN (GAUZE/BANDAGES/DRESSINGS) ×1
WATER STERILE IRR 250ML POUR (IV SOLUTION) ×2 IMPLANT

## 2022-05-11 NOTE — Transfer of Care (Signed)
Immediate Anesthesia Transfer of Care Note  Patient: [name]  Procedure(s) Performed: CATARACT EXTRACTION PHACO AND INTRAOCULAR LENS PLACEMENT ([facility]) (Left: Eye)  Patient Location: PACU and Short Stay  Anesthesia Type:MAC  Level of Consciousness: awake, alert , oriented and patient cooperative  Airway & Oxygen Therapy: Patient Spontanous Breathing  Post-op Assessment: Report given to RN, Post -op Vital signs reviewed and stable and Patient moving all extremities X 4  Post vital signs: Reviewed and stable  Last Vitals:  Vitals Value Taken Time  BP    Temp    Pulse    Resp    SpO2      Last Pain:  Vitals:   05/11/22 1109  TempSrc: Oral  PainSc: 0-No pain      Patients Stated Pain Goal: 6 (45/36/46 8032)  Complications: No notable events documented.

## 2022-05-11 NOTE — Interval H&P Note (Signed)
History and Physical Interval Note:  05/11/2022 11:31 AM  [name]  has presented today for surgery, with the diagnosis of nuclear sclerosis age related cataract; left.  The various methods of treatment have been discussed with the patient and family. After consideration of risks, benefits and other options for treatment, the patient has consented to  Procedure(s) with comments: CATARACT EXTRACTION PHACO AND INTRAOCULAR LENS PLACEMENT ([place]) (Left) - CDE as a surgical intervention.  The patient's history has been reviewed, patient examined, no change in status, stable for surgery.  I have reviewed the patient's chart and labs.  Questions were answered to the patient's satisfaction.     Baruch Goldmann

## 2022-05-11 NOTE — Op Note (Signed)
Date of procedure: 05/11/22  Pre-operative diagnosis: Visually significant age-related combined cataract, Left Eye (H25.812)  Post-operative diagnosis: Visually significant age-related combined cataract, Left Eye (H25.812)  Procedure: Removal of cataract via phacoemulsification and insertion of intra-ocular lens Alcon SN60WF +20.5D into the capsular bag of the Left Eye  Attending surgeon: Gerda Diss. Senai Kingsley, MD, MA  Anesthesia: MAC, Topical Ak[place]  Complications: None  Estimated Blood Loss: <54m (minimal)  Specimens: None  Implants: As above  Indications:  Visually significant age-related cataract, Left Eye  Procedure:  The patient was seen and identified in the pre-operative area. The operative eye was identified and dilated.  The operative eye was marked.  Topical anesthesia was administered to the operative eye.     The patient was then to the operative suite and placed in the supine position.  A timeout was performed confirming the patient, procedure to be performed, and all other relevant information.   The patient's face was prepped and draped in the usual fashion for intra-ocular surgery.  A lid speculum was placed into the operative eye and the surgical microscope moved into place and focused.  An inferotemporal paracentesis was created using a 20 gauge paracentesis blade.  Shugarcaine was injected into the anterior chamber.  Viscoelastic was injected into the anterior chamber.  A temporal clear-corneal main wound incision was created using a 2.4636mmicrokeratome.  A continuous curvilinear capsulorrhexis was initiated using an irrigating cystitome and completed using capsulorrhexis forceps.  Hydrodissection and hydrodeliniation were performed.  Viscoelastic was injected into the anterior chamber.  A phacoemulsification handpiece and a chopper as a second instrument were used to remove the nucleus and epinucleus. The irrigation/aspiration handpiece was used to remove any remaining  cortical material.   The capsular bag was reinflated with viscoelastic, checked, and found to be intact.  The intraocular lens was inserted into the capsular bag.  The irrigation/aspiration handpiece was used to remove any remaining viscoelastic.  The clear corneal wound and paracentesis wounds were then hydrated and checked with Weck-Cels to be watertight. 0.36m34mf moxifloxacin was injected into the anterior chamber. The lid-speculum was removed.  The drape was removed.  The patient's face was cleaned with a wet and dry 4x4.    A clear shield was taped over the eye. The patient was taken to the post-operative care unit in good condition, having tolerated the procedure well.  Post-Op Instructions: [facility] and will receive all other orders and instructions.

## 2022-05-11 NOTE — Discharge Instructions (Signed)
Please discharge patient when stable, will follow up today with Dr. Kinsie Belford at the [facility] office immediately following discharge.  Leave shield in place until visit.  All paperwork with discharge instructions will be given at the office.  [facility] Address:  [address]  [place], [place] [address]  

## 2022-05-11 NOTE — Anesthesia Preprocedure Evaluation (Signed)
Anesthesia Evaluation  Patient identified by MRN, date of birth, ID band Patient awake    Reviewed: Allergy & Precautions, H&P , NPO status , Patient's Chart, lab work & pertinent test results, reviewed documented beta blocker date and time   History of Anesthesia Complications (+) PONV and history of anesthetic complications  Airway Mallampati: II  TM Distance: >3 FB Neck ROM: full    Dental no notable dental hx.    Pulmonary neg pulmonary ROS,    Pulmonary exam normal breath sounds clear to auscultation       Cardiovascular Exercise Tolerance: Good hypertension, + Peripheral Vascular Disease   Rhythm:regular Rate:Normal     Neuro/Psych  Neuromuscular disease negative psych ROS   GI/Hepatic Neg liver ROS, GERD  Medicated,  Endo/Other  Hypothyroidism   Renal/GU CRFRenal disease  negative genitourinary   Musculoskeletal   Abdominal   Peds  Hematology  (+) Blood dyscrasia, anemia ,   Anesthesia Other Findings   Reproductive/Obstetrics negative OB ROS                             Anesthesia Physical Anesthesia Plan  ASA: 3  Anesthesia Plan: MAC   Post-op Pain Management:    Induction:   PONV Risk Score and Plan:   Airway Management Planned:   Additional Equipment:   Intra-op Plan:   Post-operative Plan:   Informed Consent: I have reviewed the patients History and Physical, chart, labs and discussed the procedure including the risks, benefits and alternatives for the proposed anesthesia with the patient or authorized representative who has indicated his/her understanding and acceptance.     Dental Advisory Given  Plan Discussed with: CRNA  Anesthesia Plan Comments:         Anesthesia Quick Evaluation

## 2022-05-12 ENCOUNTER — Encounter (HOSPITAL_COMMUNITY): Payer: Self-pay | Admitting: Ophthalmology

## 2022-05-13 NOTE — Anesthesia Postprocedure Evaluation (Signed)
Anesthesia Post Note  Patient: ARIBELLA VAVRA  Procedure(s) Performed: CATARACT EXTRACTION PHACO AND INTRAOCULAR LENS PLACEMENT (IOC) (Left: Eye)  Patient location during evaluation: Phase II Anesthesia Type: MAC Level of consciousness: awake Pain management: pain level controlled Vital Signs Assessment: post-procedure vital signs reviewed and stable Respiratory status: spontaneous breathing and respiratory function stable Cardiovascular status: blood pressure returned to baseline and stable Postop Assessment: no headache and no apparent nausea or vomiting Anesthetic complications: no Comments: Late entry   No notable events documented.   Last Vitals:  Vitals:   05/11/22 1236 05/11/22 1237  BP:  (!) 153/73  Pulse: 71   Resp: 16   Temp: 36.7 C   SpO2: 100%     Last Pain:  Vitals:   05/12/22 1504  TempSrc:   PainSc: 0-No pain                 Louann Sjogren

## 2022-05-25 ENCOUNTER — Other Ambulatory Visit: Payer: Self-pay | Admitting: Internal Medicine

## 2022-05-25 DIAGNOSIS — I10 Essential (primary) hypertension: Secondary | ICD-10-CM

## 2022-06-02 ENCOUNTER — Ambulatory Visit (INDEPENDENT_AMBULATORY_CARE_PROVIDER_SITE_OTHER): Payer: Medicare PPO

## 2022-06-02 VITALS — BP 128/78 | HR 56 | Ht 63.0 in | Wt 174.0 lb

## 2022-06-02 DIAGNOSIS — Z0000 Encounter for general adult medical examination without abnormal findings: Secondary | ICD-10-CM

## 2022-06-02 DIAGNOSIS — Z23 Encounter for immunization: Secondary | ICD-10-CM

## 2022-06-02 NOTE — Progress Notes (Signed)
Subjective:   [name] is a 81 y.o. female who presents for Medicare Annual (Subsequent) preventive examination.  Review of Systems           Objective:    There were no vitals filed for this visit. There is no height or weight on file to calculate BMI.     05/11/2022   10:56 AM 06/30/2021    9:55 AM 05/21/2021   10:08 AM 11/29/2017    8:24 AM 11/09/2016    3:02 PM 12/23/2015    7:59 AM 12/17/2015    1:03 PM  Advanced Directives  Does Patient Have a Medical Advance Directive? No Yes Yes No Yes No Yes  Type of Advance Directive  Living will;Healthcare Power of [place]  Does patient want to make changes to medical advance directive?   Yes (Inpatient - patient defers changing a medical advance directive at this time - Information given)  No - Patient declined    Copy of [place] in Chart?  No - copy requested No - copy requested  No - copy requested  Yes  Would patient like information on creating a medical advance directive? No - Patient declined   Yes (ED - Information included in AVS)       Current Medications (verified) Outpatient Encounter Medications as of 06/02/2022  Medication Sig   acetaminophen (TYLENOL) 500 MG tablet Take 500 mg by mouth 2 (two) times daily as needed for moderate pain.   aspirin EC 81 MG tablet Take 81 mg by mouth daily.   b complex vitamins capsule Take 1 capsule by mouth daily.   Cholecalciferol (VITAMIN D) 50 MCG (2000 UT) tablet Take 2,000 Units by mouth daily.   levothyroxine (SYNTHROID) 75 MCG tablet Take 1 tablet (75 mcg total) by mouth daily before breakfast.   lisinopril (ZESTRIL) 10 MG tablet TAKE ONE TABLET (10MG TOTAL) BY MOUTH DAILY   magnesium hydroxide (MILK OF MAGNESIA) 400 MG/5ML suspension Take 15 mLs by mouth daily as needed for moderate constipation.   No facility-administered encounter medications on file as of  06/02/2022.    Allergies (verified) Codeine, Crestor [rosuvastatin], Flexall [menthol (topical analgesic)], and Penicillins   History: Past Medical History:  Diagnosis Date   Allergy    Anemia    prior to hysterectomy   Back pain    comepensating for knee pain per pt   Cataract    right eye and immature   Complication of anesthesia    Constipation    takes Colace daily and Milk of Mag   CTS (carpal tunnel syndrome) 08/31/2016   Dizziness    but goes away real quick   Dry eyes    uses Eye Drops daily as needed   Female hypogonadism syndrome    GERD (gastroesophageal reflux disease)    History of colon polyps    Hyperlipidemia    was on meds but stopped taking back in June 2015   Hypertension    takes Lisinopril daily   Hypothyroidism    takes Synthroid daily   Joint pain    Obesity (BMI 30.0-34.9)    Peripheral arterial disease (HCC)    Pneumonia    as a baby   PONV (postoperative nausea and vomiting)    Primary localized osteoarthritis of left knee 04/17/2015   Primary localized osteoarthritis of right knee    knees   Stage 3a chronic kidney  disease ([place]) 10/14/2021   Urinary frequency    Urinary urgency    Past Surgical History:  Procedure Laterality Date   ABDOMINAL HYSTERECTOMY  90's   complete   APPENDECTOMY  80's   BACK SURGERY     CATARACT EXTRACTION W/PHACO Right 12/23/2015   Procedure: CATARACT EXTRACTION PHACO AND INTRAOCULAR LENS PLACEMENT RIGHT EYE CDE=6.42;  Surgeon: Williams Che, MD;  Location: [facility];  Service: Ophthalmology;  Laterality: Right;   CATARACT EXTRACTION W/PHACO Left 05/11/2022   Procedure: CATARACT EXTRACTION PHACO AND INTRAOCULAR LENS PLACEMENT (IOC);  Surgeon: Baruch Goldmann, MD;  Location: [facility];  Service: Ophthalmology;  Laterality: Left;  CDE 10.71   COLONOSCOPY  09/09/2004   JKD:TOIZTI rectum and colon   COLONOSCOPY N/A 11/08/2013   Procedure: COLONOSCOPY;  Surgeon: Daneil Dolin, MD;  Location: [facility];  Service:  Endoscopy;  Laterality: N/A;  10:45   COLONOSCOPY WITH PROPOFOL N/A 06/30/2021   Surgeon: Daneil Dolin, MD; diverticulosis in sigmoid, descending, transverse colon, nonbleeding internal hemorrhoids, otherwise normal exam.  No repeat due to age.   JOINT REPLACEMENT     2013/09/19, 09-19-14   [place]   RECTOCELE REPAIR  10/2007   SPINE SURGERY     ruptured disc   TOTAL KNEE ARTHROPLASTY Right 04/04/2014   DR Noemi Chapel   TOTAL KNEE ARTHROPLASTY Right 04/04/2014   Procedure: RIGHT TOTAL KNEE ARTHROPLASTY;  Surgeon: Lorn Junes, MD;  Location: [facility];  Service: Orthopedics;  Laterality: Right;   TOTAL KNEE ARTHROPLASTY Left 04/29/2015   Procedure: TOTAL KNEE ARTHROPLASTY;  Surgeon: Elsie Saas, MD;  Location: [facility];  Service: Orthopedics;  Laterality: Left;   TUBAL LIGATION     Family History  Problem Relation Age of Onset   Colon cancer Sister        age 46s   Myasthenia gravis Sister    Bladder Cancer Brother    Lung cancer Brother        age 98   Alzheimer's disease Mother    COPD Father    Asthma Father    Alcohol abuse Brother        likely died of pneumonia   Social History   Socioeconomic History   Marital status: Widowed    Spouse name: Not on file   Number of children: 2   Years of education: 46   Highest education level: Not on file  Occupational History   Occupation: retired    Comment: Doctor, general practice  Tobacco Use   Smoking status: Never   Smokeless tobacco: Never  Scientific laboratory technician Use: Never used  Substance and Sexual Activity   Alcohol use: No   Drug use: No   Sexual activity: Never    Birth control/protection: Surgical  Other Topics Concern   Not on file  Social History Narrative   Husband died after over 62 years and then a year after husband died, she met a man who she had a relationship with for over ten years. He died in 19-Sep-2017 after long illness.   Live alone.    Has two children, grown sons.    Gets out with neices, enjoys going  out to eat, going to [employer].   Goes to church.    Eats all food groups, eats some meat.       Social Determinants of Health   Financial Resource Strain: Low Risk  (05/21/2021)   Overall Financial Resource Strain (CARDIA)    Difficulty  of Paying Living Expenses: Not hard at all  Food Insecurity: No Food Insecurity (05/21/2021)   Hunger Vital Sign    Worried About Running Out of Food in the Last Year: Never true    Ran Out of Food in the Last Year: Never true  Transportation Needs: No Transportation Needs (05/21/2021)   PRAPARE - Hydrologist (Medical): No    Lack of Transportation (Non-Medical): No  Physical Activity: Inactive (05/21/2021)   Exercise Vital Sign    Days of Exercise per Week: 0 days    Minutes of Exercise per Session: 0 min  Stress: No Stress Concern Present (05/21/2021)   [place]    Feeling of Stress : Only a little  Social Connections: Socially Isolated (05/21/2021)   Social Connection and Isolation Panel [NHANES]    Frequency of Communication with Friends and Family: More than three times a week    Frequency of Social Gatherings with Friends and Family: Three times a week    Attends Religious Services: Never    Active Member of Clubs or Organizations: No    Attends Archivist Meetings: Never    Marital Status: Widowed    Tobacco Counseling Counseling given: Not Answered   Clinical Intake:                 Diabetic?no         Activities of Daily Living     No data to display          Patient Care Team: Lindell Spar, MD as PCP - General (Internal Medicine) Herminio Commons, MD (Inactive) as PCP - Cardiology (Cardiology) Gala Romney Cristopher Estimable, MD as Consulting Physician (Gastroenterology)  Indicate any recent Medical Services you may have received from other than [facility] providers in the past year (date may be approximate).      Assessment:   This is a routine wellness examination for [place].  Hearing/Vision screen No results found.  Dietary issues and exercise activities discussed:     Goals Addressed   None    Depression Screen    02/13/2022    8:14 AM 10/14/2021    8:44 AM 07/16/2021    8:07 AM 05/21/2021   10:11 AM 05/21/2021   10:06 AM 04/25/2021    9:14 AM 07/04/2019    1:04 PM  PHQ 2/9 Scores  PHQ - 2 Score 0 1 0 0 0 0 0    Fall Risk    02/13/2022    8:14 AM 10/14/2021    8:44 AM 07/16/2021    8:07 AM 05/21/2021   10:10 AM 04/25/2021    9:14 AM  Fall Risk   Falls in the past year? 0 1 0 0 0  Number falls in past yr: 0 0 0 0 0  Injury with Fall? 0 0 0 0 0  Risk for fall due to : _0   Follow up Falls evaluation completed Falls evaluation completed Falls evaluation completed  Falls evaluation completed    [place]:  Any stairs in or around the home? Yes  If so, are there any without handrails? No  Home free of loose throw rugs in walkways, pet beds, electrical cords, etc? Yes  Adequate lighting in your home to reduce risk of falls? Yes   ASSISTIVE DEVICES UTILIZED TO PREVENT FALLS:  Life alert? No  Use of a cane, walker or w/c? No  Grab bars in the bathroom? Yes  Shower chair or bench in shower? Yes  Elevated toilet seat or a handicapped toilet? Yes   TIMED UP AND GO:  Was the test performed? Yes .  Length of time to ambulate 10 feet: 60 sec.   Gait slow and steady without use of assistive device  Cognitive Function:    05/21/2021   10:12 AM 11/09/2016    3:17 PM  MMSE - Mini Mental State Exam  Not completed: Unable to complete   Orientation to time  5  Orientation to Place  5  Registration  3  Attention/ Calculation  5  Recall  3  Language- name 2 objects  2  Language- repeat  1  Language- follow 3 step command  3  Language- read & follow direction  1  Write a  sentence  1  Copy design  1  Total score  30        05/21/2021   10:12 AM 11/29/2017    8:27 AM  6CIT Screen  What Year? 0 points 0 points  What month? 0 points 0 points  What time? 0 points 0 points  Count back from 20 0 points 0 points  Months in reverse 0 points 0 points  Repeat phrase 0 points 0 points  Total Score 0 points 0 points    Immunizations Immunization History  Administered Date(s) Administered   Fluad Quad(high Dose 65+) 05/21/2020, 04/25/2021   Influenza, High Dose Seasonal PF 04/20/2016   Influenza,inj,Quad PF,6+ Mos 07/02/2017, 05/04/2019   MODERNA COVID-19 SARS-COV-2 PEDS BIVALENT BOOSTER 6Y-11Y 07/04/2021   Moderna Sars-Covid-2 Vaccination 09/13/2019, 10/11/2019, 05/14/2020   Pneumococcal Conjugate-13 09/28/2016   Pneumococcal Polysaccharide-23 10/01/2017   Tdap 12/30/2017    TDAP status: Up to date  Flu Vaccine status: Completed at today's visit  Pneumococcal vaccine status: Up to date  Covid-19 vaccine status: Information provided on how to obtain vaccines.   Qualifies for Shingles Vaccine? Yes   Zostavax completed No   Shingrix Completed?: No.    Education has been provided regarding the importance of this vaccine. Patient has been advised to call insurance company to determine out of pocket expense if they have not yet received this vaccine. Advised may also receive vaccine at local pharmacy or Health Dept. Verbalized acceptance and understanding.  Screening Tests Health Maintenance  Topic Date Due   Zoster Vaccines- Shingrix (1 of 2) Never done   COVID-19 Vaccine (5 - Moderna series) 11/02/2021   INFLUENZA VACCINE  02/17/2022   Medicare Annual Wellness (AWV)  05/21/2022   TETANUS/TDAP  12/31/2027   Pneumonia Vaccine 10+ Years old  Completed   DEXA SCAN  Completed   Hepatitis C Screening  Completed   HPV VACCINES  Aged Out   COLONOSCOPY (Pts 45-43yr Insurance coverage will need to be confirmed)  Discontinued    Health  Maintenance  Health Maintenance Due  Topic Date Due   Zoster Vaccines- Shingrix (1 of 2) Never done   COVID-19 Vaccine (5 - Moderna series) 11/02/2021   INFLUENZA VACCINE  02/17/2022   Medicare Annual Wellness (AWV)  05/21/2022    Colorectal cancer screening: Type of screening: Colonoscopy. Completed 12/12/2. Repeat every   years  Mammogram status: No longer required due to  .    Lung Cancer Screening: (Low Dose CT Chest recommended if Age 79-80years, 30 pack-year currently smoking OR have quit w/in 15years.) does not  qualify.   Lung Cancer Screening Referral:   Additional Screening:  Hepatitis C Screening: does not qualify; Completed 09/09/20  Vision Screening: Recommended annual ophthalmology exams for early detection of glaucoma and other disorders of the eye. Is the patient up to date with their annual eye exam?  No  Who is the provider or what is the name of the office in which the patient attends annual eye exams? Dr Jorja Loa If pt is not established with a provider, would they like to be referred to a provider to establish care? No .   Dental Screening: Recommended annual dental exams for proper oral hygiene  Community Resource Referral / Chronic Care Management: CRR required this visit?  No   CCM required this visit?  No      Plan:     I have personally reviewed and noted the following in the patient's chart:   Medical and social history Use of alcohol, tobacco or illicit drugs  Current medications and supplements including opioid prescriptions. Patient is not currently taking opioid prescriptions. Functional ability and status Nutritional status Physical activity Advanced directives List of other physicians Hospitalizations, surgeries, and ER visits in previous 12 months Vitals Screenings to include cognitive, depression, and falls Referrals and appointments  In addition, I have reviewed and discussed with patient certain preventive protocols, quality  metrics, and best practice recommendations. A written personalized care plan for preventive services as well as general preventive health recommendations were provided to patient.     Jill Side, [place]   06/02/2022   Nurse Notes:

## 2022-06-02 NOTE — Patient Instructions (Signed)
  Ms. Frese , Thank you for taking time to come for your Medicare Wellness Visit. I appreciate your ongoing commitment to your health goals. Please review the following plan we discussed and let me know if I can assist you in the future.   These are the goals we discussed:  Goals      Blood Pressure < 140/90     Exercise 3x per week (30 min per time)     Obese, recommend starting a routine exercise program at least 3 days a week for 30-45 minutes at a time as tolerated.       Patient Stated     Keep weight down.     Patient Stated     Patient states that her goal is to stay busy and active.     Weight (lb) < 160 lb (72.6 kg)        This is a list of the screening recommended for you and due dates:  Health Maintenance  Topic Date Due   Zoster (Shingles) Vaccine (1 of 2) Never done   COVID-19 Vaccine (5 - Moderna series) 11/02/2021   Flu Shot  02/17/2022   Medicare Annual Wellness Visit  06/03/2023   Tetanus Vaccine  12/31/2027   Pneumonia Vaccine  Completed   DEXA scan (bone density measurement)  Completed   Hepatitis C Screening: USPSTF Recommendation to screen - Ages 51-79 yo.  Completed   HPV Vaccine  Aged Out   Colon Cancer Screening  Discontinued

## 2022-07-15 DIAGNOSIS — E782 Mixed hyperlipidemia: Secondary | ICD-10-CM | POA: Diagnosis not present

## 2022-07-15 DIAGNOSIS — R739 Hyperglycemia, unspecified: Secondary | ICD-10-CM | POA: Diagnosis not present

## 2022-07-15 DIAGNOSIS — N1831 Chronic kidney disease, stage 3a: Secondary | ICD-10-CM | POA: Diagnosis not present

## 2022-07-15 DIAGNOSIS — E039 Hypothyroidism, unspecified: Secondary | ICD-10-CM | POA: Diagnosis not present

## 2022-07-16 LAB — TSH+FREE T4
Free T4: 1.19 ng/dL (ref 0.82–1.77)
TSH: 10.5 u[IU]/mL — ABNORMAL HIGH (ref 0.450–4.500)

## 2022-07-16 LAB — HEMOGLOBIN A1C
Est. average glucose Bld gHb Est-mCnc: 111 mg/dL
Hgb A1c MFr Bld: 5.5 % (ref 4.8–5.6)

## 2022-07-16 LAB — CBC WITH DIFFERENTIAL/PLATELET
Basophils Absolute: 0 10*3/uL (ref 0.0–0.2)
Basos: 1 %
EOS (ABSOLUTE): 0.1 10*3/uL (ref 0.0–0.4)
Eos: 2 %
Hematocrit: 38.8 % (ref 34.0–46.6)
Hemoglobin: 12.8 g/dL (ref 11.1–15.9)
Immature Grans (Abs): 0 10*3/uL (ref 0.0–0.1)
Immature Granulocytes: 0 %
Lymphocytes Absolute: 2.5 10*3/uL (ref 0.7–3.1)
Lymphs: 41 %
MCH: 29.1 pg (ref 26.6–33.0)
MCHC: 33 g/dL (ref 31.5–35.7)
MCV: 88 fL (ref 79–97)
Monocytes Absolute: 0.5 10*3/uL (ref 0.1–0.9)
Monocytes: 8 %
Neutrophils Absolute: 2.9 10*3/uL (ref 1.4–7.0)
Neutrophils: 48 %
Platelets: 207 10*3/uL (ref 150–450)
RBC: 4.4 x10E6/uL (ref 3.77–5.28)
RDW: 13.2 % (ref 11.7–15.4)
WBC: 6 10*3/uL (ref 3.4–10.8)

## 2022-07-16 LAB — CMP14+EGFR
ALT: 12 IU/L (ref 0–32)
AST: 17 IU/L (ref 0–40)
Albumin/Globulin Ratio: 2 (ref 1.2–2.2)
Albumin: 4 g/dL (ref 3.8–4.8)
Alkaline Phosphatase: 74 IU/L (ref 44–121)
BUN/Creatinine Ratio: 17 (ref 12–28)
BUN: 17 mg/dL (ref 8–27)
Bilirubin Total: 0.3 mg/dL (ref 0.0–1.2)
CO2: 22 mmol/L (ref 20–29)
Calcium: 9.1 mg/dL (ref 8.7–10.3)
Chloride: 105 mmol/L (ref 96–106)
Creatinine, Ser: 0.98 mg/dL (ref 0.57–1.00)
Globulin, Total: 2 g/dL (ref 1.5–4.5)
Glucose: 92 mg/dL (ref 70–99)
Potassium: 4.7 mmol/L (ref 3.5–5.2)
Sodium: 142 mmol/L (ref 134–144)
Total Protein: 6 g/dL (ref 6.0–8.5)
eGFR: 59 mL/min/{1.73_m2} — ABNORMAL LOW (ref >59)

## 2022-07-16 LAB — LIPID PANEL
Chol/HDL Ratio: 4.6 ratio — ABNORMAL HIGH (ref 0.0–4.4)
Cholesterol, Total: 210 mg/dL — ABNORMAL HIGH (ref 100–199)
HDL: 46 mg/dL (ref >39)
LDL Chol Calc (NIH): 141 mg/dL — ABNORMAL HIGH (ref 0–99)
Triglycerides: 127 mg/dL (ref 0–149)
VLDL Cholesterol Cal: 23 mg/dL (ref 5–40)

## 2022-07-16 LAB — VITAMIN D 25 HYDROXY (VIT D DEFICIENCY, FRACTURES): Vit D, 25-Hydroxy: 53.7 ng/mL (ref 30.0–100.0)

## 2022-07-21 ENCOUNTER — Encounter: Payer: Self-pay | Admitting: Internal Medicine

## 2022-07-21 ENCOUNTER — Ambulatory Visit (INDEPENDENT_AMBULATORY_CARE_PROVIDER_SITE_OTHER): Payer: Medicare PPO | Admitting: Internal Medicine

## 2022-07-21 VITALS — BP 119/76 | HR 69 | Ht 63.0 in | Wt 178.6 lb

## 2022-07-21 DIAGNOSIS — G5603 Carpal tunnel syndrome, bilateral upper limbs: Secondary | ICD-10-CM | POA: Diagnosis not present

## 2022-07-21 DIAGNOSIS — E039 Hypothyroidism, unspecified: Secondary | ICD-10-CM

## 2022-07-21 DIAGNOSIS — I10 Essential (primary) hypertension: Secondary | ICD-10-CM | POA: Diagnosis not present

## 2022-07-21 DIAGNOSIS — N1831 Chronic kidney disease, stage 3a: Secondary | ICD-10-CM | POA: Diagnosis not present

## 2022-07-21 DIAGNOSIS — R002 Palpitations: Secondary | ICD-10-CM | POA: Diagnosis not present

## 2022-07-21 DIAGNOSIS — Z0001 Encounter for general adult medical examination with abnormal findings: Secondary | ICD-10-CM

## 2022-07-21 MED ORDER — LEVOTHYROXINE SODIUM 88 MCG PO TABS: 88.0000 ug | ORAL_TABLET | Freq: Every day | ORAL | 1 refills | Status: DC

## 2022-07-21 NOTE — Progress Notes (Signed)
Established Patient Office Visit  Subjective:  Patient ID: [name], female    DOB: 09-30-1940  Age: 82 y.o. MRN: [id number]  CC:  Chief Complaint  Patient presents with   Annual Exam    Patient has been experiencing shortness of breath, having a faster heart rate than usual. Experiencing light headed and dizziness.    HPI [name] is a 82 y.o. female with past medical history of HTN, hypothyroidism and vitamin D deficiency who presents for annual physical.  HTN: BP is well-controlled. Takes medications regularly. Patient denies headache, dizziness or chest pain. She reports exertional dyspnea and intermittent palpitations.  Hypothyroidism: She has started taking levothyroxine 75 mcg.  Her TSH is still elevated now, as we had tried to lower dose of thyroid hormone as it was oversupplemented in the past.  She has gained 6 pounds since the last visit. She does report fatigue and feeling sluggish in the morning. Of note, her jitteriness/anxiety and palpitations had resolved since switching from NP thyroid, but has been anxious lately since her friend recently had heart attack.  Her BMP showed GFR of 59, stable.  She denies any dysuria, hematuria, urinary hesitance or resistance.  She agrees to improve fluid intake.   Blood tests were reviewed and discussed with the patient in detail.  Past Medical History:  Diagnosis Date   Allergy    Anemia    prior to hysterectomy   Back pain    comepensating for knee pain per pt   Cataract    right eye and immature   Complication of anesthesia    Constipation    takes Colace daily and Milk of Mag   CTS (carpal tunnel syndrome) 08/31/2016   Dizziness    but goes away real quick   Dry eyes    uses Eye Drops daily as needed   Female hypogonadism syndrome    GERD (gastroesophageal reflux disease)    History of colon polyps    Hyperlipidemia    was on meds but stopped taking back in June 2015   Hypertension    takes Lisinopril  daily   Hypothyroidism    takes Synthroid daily   Joint pain    Obesity (BMI 30.0-34.9)    Peripheral arterial disease (HCC)    Pneumonia    as a baby   PONV (postoperative nausea and vomiting)    Primary localized osteoarthritis of left knee 04/17/2015   Primary localized osteoarthritis of right knee    knees   Stage 3a chronic kidney disease ([place]) 10/14/2021   Urinary frequency    Urinary urgency     Past Surgical History:  Procedure Laterality Date   ABDOMINAL HYSTERECTOMY  90's   complete   APPENDECTOMY  80's   BACK SURGERY     CATARACT EXTRACTION W/PHACO Right 12/23/2015   Procedure: CATARACT EXTRACTION PHACO AND INTRAOCULAR LENS PLACEMENT RIGHT EYE CDE=6.42;  Surgeon: Williams Che, MD;  Location: [facility];  Service: Ophthalmology;  Laterality: Right;   CATARACT EXTRACTION W/PHACO Left 05/11/2022   Procedure: CATARACT EXTRACTION PHACO AND INTRAOCULAR LENS PLACEMENT (IOC);  Surgeon: Baruch Goldmann, MD;  Location: [facility];  Service: Ophthalmology;  Laterality: Left;  CDE 10.71   COLONOSCOPY  09/09/2004   [id number] rectum and colon   COLONOSCOPY N/A 11/08/2013   Procedure: COLONOSCOPY;  Surgeon: Daneil Dolin, MD;  [facility];  Service: Endoscopy;  Laterality: N/A;  10:45   COLONOSCOPY WITH PROPOFOL N/A 06/30/2021   Surgeon:  Daneil Dolin, MD; diverticulosis in sigmoid, descending, transverse colon, nonbleeding internal hemorrhoids, otherwise normal exam.  No repeat due to age.   JOINT REPLACEMENT     Sep 13, 2013, 13-Sep-2014   [facility]   RECTOCELE REPAIR  10/2007   SPINE SURGERY     ruptured disc   TOTAL KNEE ARTHROPLASTY Right 04/04/2014   DR Noemi Chapel   TOTAL KNEE ARTHROPLASTY Right 04/04/2014   Procedure: RIGHT TOTAL KNEE ARTHROPLASTY;  Surgeon: Lorn Junes, MD;  Location: [place];  Service: Orthopedics;  Laterality: Right;   TOTAL KNEE ARTHROPLASTY Left 04/29/2015   Procedure: TOTAL KNEE ARTHROPLASTY;  Surgeon: Elsie Saas, MD;  Location: [place];   Service: Orthopedics;  Laterality: Left;   TUBAL LIGATION      Family History  Problem Relation Age of Onset   Colon cancer Sister        age 1s   Myasthenia gravis Sister    Bladder Cancer Brother    Lung cancer Brother        age 51   Alzheimer's disease Mother    COPD Father    Asthma Father    Alcohol abuse Brother        likely died of pneumonia    Social History   Socioeconomic History   Marital status: Widowed    Spouse name: Not on file   Number of children: 2   Years of education: 24   Highest education level: Not on file  Occupational History   Occupation: retired    Comment: Doctor, general practice  Tobacco Use   Smoking status: Never   Smokeless tobacco: Never  Scientific laboratory technician Use: Never used  Substance and Sexual Activity   Alcohol use: No   Drug use: No   Sexual activity: Never    Birth control/protection: Surgical  Other Topics Concern   Not on file  Social History Narrative   Husband died after over 94 years and then a year after husband died, she met a man who she had a relationship with for over ten years. He died in 2017-09-13 after long illness.   Live alone.    Has two children, grown sons.    Gets out with neices, enjoys going out to eat, going to [employer].   Goes to church.    Eats all food groups, eats some meat.       Social Determinants of Health   Financial Resource Strain: Low Risk  (06/02/2022)   Overall Financial Resource Strain (CARDIA)    Difficulty of Paying Living Expenses: Not hard at all  Food Insecurity: No Food Insecurity (06/02/2022)   Hunger Vital Sign    Worried About Running Out of Food in the Last Year: Never true    Ran Out of Food in the Last Year: Never true  Transportation Needs: No Transportation Needs (06/02/2022)   PRAPARE - Hydrologist (Medical): No    Lack of Transportation (Non-Medical): No  Physical Activity: Inactive (05/21/2021)   Exercise Vital Sign    Days of  Exercise per Week: 0 days    Minutes of Exercise per Session: 0 min  Stress: No Stress Concern Present (06/02/2022)   [name]    Feeling of Stress : Not at all  Social Connections: Moderately Isolated (06/02/2022)   Social Connection and Isolation Panel [NHANES]    Frequency of Communication with Friends and Family: More than three  times a week    Frequency of Social Gatherings with Friends and Family: More than three times a week    Attends Religious Services: More than 4 times per year    Active Member of [employer] or Organizations: No    Attends Archivist Meetings: Never    Marital Status: Widowed  Intimate Partner Violence: Not At Risk (05/21/2021)   Humiliation, Afraid, Rape, and Kick questionnaire    Fear of Current or Ex-Partner: No    Emotionally Abused: No    Physically Abused: No    Sexually Abused: No    Outpatient Medications Prior to Visit  Medication Sig Dispense Refill   acetaminophen (TYLENOL) 500 MG tablet Take 500 mg by mouth 2 (two) times daily as needed for moderate pain. (Patient not taking: Reported on 06/02/2022)     aspirin EC 81 MG tablet Take 81 mg by mouth daily.     b complex vitamins capsule Take 1 capsule by mouth daily.     Cholecalciferol (VITAMIN D) 50 MCG (2000 UT) tablet Take 2,000 Units by mouth daily.     lisinopril (ZESTRIL) 10 MG tablet TAKE ONE TABLET (10MG TOTAL) BY MOUTH DAILY 90 tablet 0   magnesium hydroxide (MILK OF MAGNESIA) 400 MG/5ML suspension Take 15 mLs by mouth daily as needed for moderate constipation.     levothyroxine (SYNTHROID) 75 MCG tablet Take 1 tablet (75 mcg total) by mouth daily before breakfast. 90 tablet 1   No facility-administered medications prior to visit.    Allergies  Allergen Reactions   Codeine Nausea And Vomiting   Crestor [Rosuvastatin] Other (See Comments)    Body aches   Flexall [Menthol (Topical Analgesic)] Rash   Penicillins  Rash    ROS Review of Systems  Constitutional:  Negative for chills and fever.  HENT:  Negative for congestion, sinus pressure, sinus pain and sore throat.   Eyes:  Negative for pain and discharge.  Respiratory:  Negative for cough and shortness of breath.   Cardiovascular:  Positive for palpitations. Negative for chest pain.  Gastrointestinal:  Negative for abdominal pain, diarrhea, nausea and vomiting.  Endocrine: Negative for polydipsia and polyuria.  Genitourinary:  Negative for dysuria and hematuria.  Musculoskeletal:  Positive for arthralgias and back pain. Negative for neck pain and neck stiffness.  Skin:  Negative for rash.  Neurological:  Positive for dizziness and numbness. Negative for weakness.  Psychiatric/Behavioral:  Negative for agitation and behavioral problems. The patient is not nervous/anxious.       Objective:    Physical Exam Vitals reviewed.  Constitutional:      General: She is not in acute distress.    Appearance: She is not diaphoretic.  HENT:     Head: Normocephalic and atraumatic.     Nose: Nose normal.     Mouth/Throat:     Mouth: Mucous membranes are moist.  Eyes:     General: No scleral icterus.    Extraocular Movements: Extraocular movements intact.  Cardiovascular:     Rate and Rhythm: Normal rate and regular rhythm.     Pulses: Normal pulses.     Heart sounds: Normal heart sounds. No murmur heard. Pulmonary:     Breath sounds: Normal breath sounds. No wheezing or rales.  Abdominal:     Palpations: Abdomen is soft.     Tenderness: There is no abdominal tenderness.  Musculoskeletal:     Cervical back: Neck supple. No tenderness.     Right lower leg: No edema.  Left lower leg: No edema.  Skin:    General: Skin is warm.     Findings: No rash.  Neurological:     General: No focal deficit present.     Mental Status: She is alert and oriented to person, place, and time.     Cranial Nerves: No cranial nerve deficit.     Sensory: No  sensory deficit.     Motor: No weakness.  Psychiatric:        Mood and Affect: Mood normal.        Behavior: Behavior normal.     BP 119/76 (BP Location: Left Arm, Patient Position: Sitting, Cuff Size: Large)   Pulse 69   Ht _0  (1.6 m)   Wt 178 lb 9.6 oz (81 kg)   LMP 07/20/1990 (Approximate)   SpO2 94%   BMI 31.64 kg/m  Wt Readings from Last 3 Encounters:  07/21/22 178 lb 9.6 oz (81 kg)  06/02/22 174 lb (78.9 kg)  05/11/22 172 lb 6.4 oz (78.2 kg)    Lab Results  Component Value Date   TSH 10.500 (H) 07/15/2022   Lab Results  Component Value Date   WBC 6.0 07/15/2022   HGB 12.8 07/15/2022   HCT 38.8 07/15/2022   MCV 88 07/15/2022   PLT 207 07/15/2022   Lab Results  Component Value Date   NA 142 07/15/2022   K 4.7 07/15/2022   CO2 22 07/15/2022   GLUCOSE 92 07/15/2022   BUN 17 07/15/2022   CREATININE 0.98 07/15/2022   BILITOT 0.3 07/15/2022   ALKPHOS 74 07/15/2022   AST 17 07/15/2022   ALT 12 07/15/2022   PROT 6.0 07/15/2022   ALBUMIN 4.0 07/15/2022   CALCIUM 9.1 07/15/2022   ANIONGAP 13 05/01/2015   EGFR 59 (L) 07/15/2022   Lab Results  Component Value Date   CHOL 210 (H) 07/15/2022   Lab Results  Component Value Date   HDL 46 07/15/2022   Lab Results  Component Value Date   LDLCALC 141 (H) 07/15/2022   Lab Results  Component Value Date   TRIG 127 07/15/2022   Lab Results  Component Value Date   CHOLHDL 4.6 (H) 07/15/2022   Lab Results  Component Value Date   HGBA1C 5.5 07/15/2022      Assessment & Plan:   Hypertension BP Readings from Last 1 Encounters:  07/21/22 119/76   Well-controlled Counseled for compliance with the medications Advised DASH diet and moderate exercise/walking, at least 150 mins/week  Hypothyroidism Lab Results  Component Value Date   TSH 10.500 (H) 07/15/2022   Increased compared to prior On levothyroxine dose 75 mcg QD now, increased dose to 88 mcg QD Check TSH and free T4 - if persistently  elevated TSH, will increase dose of Levothyroxine  Stage 3a chronic kidney disease (HCC) Likely due to inadequate fluid intake and/or age related decline Has h/o horseshoe kidney On ACEi Avoid nephrotoxic agents Advised to increase fluid intake Check BMP  Encounter for general adult medical examination with abnormal findings Physical exam as documented. Counseling done  re healthy lifestyle involving commitment to 150 minutes exercise per week, heart healthy diet, and attaining healthy weight.The importance of adequate sleep also discussed. Changes in health habits are decided on by the patient with goals and time frames  set for achieving them. Immunization and cancer screening needs are specifically addressed at this visit.  Palpitations EKG: Sinus bradycardia. No signs of active ischemia.  Her fitbit also shows HR in  50s in resting condition. Her anxiety could be contributing to palpitations. Dizziness could be due to orthostatic changes, also needs to maintain adequate hydration  CTS (carpal tunnel syndrome) Advised to use wrist brace for now    Meds ordered this encounter  Medications   levothyroxine (SYNTHROID) 88 MCG tablet    Sig: Take 1 tablet (88 mcg total) by mouth daily before breakfast.    Dispense:  90 tablet    Refill:  1    Dose change    Follow-up: Return in about 4 months (around 11/19/2022) for Hypothyroidism.    Lindell Spar, MD

## 2022-07-21 NOTE — Assessment & Plan Note (Signed)
BP Readings from Last 1 Encounters:  07/21/22 119/76   Well-controlled Counseled for compliance with the medications Advised DASH diet and moderate exercise/walking, at least 150 mins/week

## 2022-07-21 NOTE — Assessment & Plan Note (Signed)
Lab Results  Component Value Date   TSH 10.500 (H) 07/15/2022   Increased compared to prior On levothyroxine dose 75 mcg QD now, increased dose to 88 mcg QD Check TSH and free T4 - if persistently elevated TSH, will increase dose of Levothyroxine

## 2022-07-21 NOTE — Assessment & Plan Note (Signed)
EKG: Sinus bradycardia. No signs of active ischemia.  Her fitbit also shows HR in 50s in resting condition. Her anxiety could be contributing to palpitations. Dizziness could be due to orthostatic changes, also needs to maintain adequate hydration

## 2022-07-21 NOTE — Assessment & Plan Note (Signed)
Likely due to inadequate fluid intake and/or age related decline Has h/o horseshoe kidney On ACEi Avoid nephrotoxic agents Advised to increase fluid intake Check BMP

## 2022-07-21 NOTE — Assessment & Plan Note (Signed)
Advised to use wrist brace for now

## 2022-07-21 NOTE — Patient Instructions (Signed)
Please start taking Levothyroxine 88 mcg instead of 75 mcg.  Please take Vitamin D 2000 IU once daily instead of 5000 IU.  Please continue taking other medications as prescribed.  Please continue to follow low salt diet and ambulate as tolerated.

## 2022-07-21 NOTE — Assessment & Plan Note (Signed)

## 2022-08-06 ENCOUNTER — Ambulatory Visit
Admission: EM | Admit: 2022-08-06 | Discharge: 2022-08-06 | Disposition: A | Discharge status: Home / Self Care | Payer: Medicare PPO

## 2022-08-06 ENCOUNTER — Other Ambulatory Visit: Payer: Self-pay

## 2022-08-06 ENCOUNTER — Encounter: Payer: Self-pay | Admitting: Emergency Medicine

## 2022-08-06 DIAGNOSIS — G5702 Lesion of sciatic nerve, left lower limb: Secondary | ICD-10-CM | POA: Diagnosis not present

## 2022-08-06 MED ORDER — DEXAMETHASONE SODIUM PHOSPHATE 10 MG/ML IJ SOLN
10.0000 mg | Freq: Once | INTRAMUSCULAR | Status: AC
  Administered 2022-08-06: 10 mg via INTRAMUSCULAR

## 2022-08-06 MED ORDER — TIZANIDINE HCL 2 MG PO CAPS: 2.0000 mg | ORAL_CAPSULE | Freq: Three times a day (TID) | ORAL | 0 refills | Status: DC | PRN

## 2022-08-06 NOTE — ED Provider Notes (Signed)
[facility]    CSN: [id number] Arrival date & time: 08/06/22  1242      History   Chief Complaint Chief Complaint  Patient presents with   Hip Pain    HPI [name] is a 82 y.o. female.   Patient presenting today with several day history of progressively worsening left posterior buttock pain radiating down the back of the leg with weightbearing or movement.  Denies any injury prior to onset of symptoms.  Denies bowel or bladder incontinence, saddle anesthesias, numbness, tingling, weakness.  Trying Tylenol with minimal relief.    Past Medical History:  Diagnosis Date   Allergy    Anemia    prior to hysterectomy   Back pain    comepensating for knee pain per pt   Cataract    right eye and immature   Complication of anesthesia    Constipation    takes Colace daily and Milk of Mag   CTS (carpal tunnel syndrome) 08/31/2016   Dizziness    but goes away real quick   Dry eyes    uses Eye Drops daily as needed   Female hypogonadism syndrome    GERD (gastroesophageal reflux disease)    History of colon polyps    Hyperlipidemia    was on meds but stopped taking back in June 2015   Hypertension    takes Lisinopril daily   Hypothyroidism    takes Synthroid daily   Joint pain    Obesity (BMI 30.0-34.9)    Peripheral arterial disease (HCC)    Pneumonia    as a baby   PONV (postoperative nausea and vomiting)    Primary localized osteoarthritis of left knee 04/17/2015   Primary localized osteoarthritis of right knee    knees   Stage 3a chronic kidney disease ([place]) 10/14/2021   Urinary frequency    Urinary urgency     Patient Active Problem List   Diagnosis Date Noted   Palpitations 07/21/2022   Gastroesophageal reflux disease without esophagitis 02/13/2022   Hemorrhoids 01/07/2022   Stage 3a chronic kidney disease ([place]) 10/14/2021   Encounter for general adult medical examination with abnormal findings 07/16/2021   Bilateral impacted cerumen  04/25/2021   Obesity (BMI 30.0-34.9)    DJD (degenerative joint disease), cervical 10/15/2016   Vitamin D deficiency 09/28/2016   Knee joint replacement status, bilateral 08/31/2016   Chronic constipation 08/31/2016   CTS (carpal tunnel syndrome) 08/31/2016   Hyperlipidemia 04/19/2015   Peripheral arterial disease ([place]) 04/19/2015   DJD (degenerative joint disease) of knee 04/04/2014   Hypertension    Hypothyroidism    Recurrent urinary tract infection    Rectal bleeding 10/31/2013   FH: colon cancer 10/31/2013    Past Surgical History:  Procedure Laterality Date   ABDOMINAL HYSTERECTOMY  90's   complete   APPENDECTOMY  80's   BACK SURGERY     CATARACT EXTRACTION W/PHACO Right 12/23/2015   Procedure: CATARACT EXTRACTION PHACO AND INTRAOCULAR LENS PLACEMENT RIGHT EYE CDE=6.42;  Surgeon: Williams Che, MD;  Location: [facility];  Service: Ophthalmology;  Laterality: Right;   CATARACT EXTRACTION W/PHACO Left 05/11/2022   Procedure: CATARACT EXTRACTION PHACO AND INTRAOCULAR LENS PLACEMENT (IOC);  Surgeon: Baruch Goldmann, MD;  Location: [facility];  Service: Ophthalmology;  Laterality: Left;  CDE 10.71   COLONOSCOPY  09/09/2004   [id number] rectum and colon   COLONOSCOPY N/A 11/08/2013   Procedure: COLONOSCOPY;  Surgeon: Daneil Dolin, MD;  [facility];  Service: Endoscopy;  Laterality: N/A;  10:45   COLONOSCOPY WITH PROPOFOL N/A 06/30/2021   Surgeon: Daneil Dolin, MD; diverticulosis in sigmoid, descending, transverse colon, nonbleeding internal hemorrhoids, otherwise normal exam.  No repeat due to age.   JOINT REPLACEMENT     2015, 2016   [address]   RECTOCELE REPAIR  10/2007   SPINE SURGERY     ruptured disc   TOTAL KNEE ARTHROPLASTY Right 04/04/2014   DR Noemi Chapel   TOTAL KNEE ARTHROPLASTY Right 04/04/2014   Procedure: RIGHT TOTAL KNEE ARTHROPLASTY;  Surgeon: Lorn Junes, MD;  Location: [place];  Service: Orthopedics;  Laterality: Right;   TOTAL KNEE  ARTHROPLASTY Left 04/29/2015   Procedure: TOTAL KNEE ARTHROPLASTY;  Surgeon: Elsie Saas, MD;  Location: [place];  Service: Orthopedics;  Laterality: Left;   TUBAL LIGATION      OB History   No obstetric history on file.      Home Medications    Prior to Admission medications   Medication Sig Start Date End Date Taking? Authorizing Provider  polyethylene glycol powder (GLYCOLAX/MIRALAX) 17 GM/SCOOP powder Take 1 Container by mouth once. As needed   Yes [provider]  tizanidine (ZANAFLEX) 2 MG capsule Take 1 capsule (2 mg total) by mouth 3 (three) times daily as needed for muscle spasms. Do not drink alcohol or drive while taking this medication. May cause drowsiness 08/06/22  Yes Volney American, PA-C  acetaminophen (TYLENOL) 500 MG tablet Take 500 mg by mouth 2 (two) times daily as needed for moderate pain.    [provider]  aspirin EC 81 MG tablet Take 81 mg by mouth daily.    [provider]  b complex vitamins capsule Take 1 capsule by mouth daily.    [provider]  Cholecalciferol (VITAMIN D) 50 MCG (2000 UT) tablet Take 2,000 Units by mouth daily.    [provider]  levothyroxine (SYNTHROID) 88 MCG tablet Take 1 tablet (88 mcg total) by mouth daily before breakfast. 07/21/22   Lindell Spar, MD  lisinopril (ZESTRIL) 10 MG tablet TAKE ONE TABLET ('10MG'$  TOTAL) BY MOUTH DAILY 05/25/22   Lindell Spar, MD  magnesium hydroxide (MILK OF MAGNESIA) 400 MG/5ML suspension Take 15 mLs by mouth daily as needed for moderate constipation.    [provider]    Family History Family History  Problem Relation Age of Onset   Colon cancer Sister        age 49s   Myasthenia gravis Sister    Bladder Cancer Brother    Lung cancer Brother        age 49   Alzheimer's disease Mother    COPD Father    Asthma Father    Alcohol abuse Brother        likely died of pneumonia    Social History Social History   Tobacco Use    Smoking status: Never   Smokeless tobacco: Never  Vaping Use   Vaping Use: Never used  Substance Use Topics   Alcohol use: No   Drug use: No     Allergies   Codeine, Crestor [rosuvastatin], Flexall [menthol (topical analgesic)], and Penicillins   Review of Systems Review of Systems PER HPI  Physical Exam Triage Vital Signs ED Triage Vitals  Enc Vitals Group     BP 08/06/22 1309 (!) 158/85     Pulse Rate 08/06/22 1309 65     Resp 08/06/22 1309 20  Temp 08/06/22 1309 98 F (36.7 C)     Temp Source 08/06/22 1309 Oral     SpO2 08/06/22 1309 93 %     Weight --      Height --      Head Circumference --      Peak Flow --      Pain Score 08/06/22 1306 10     Pain Loc --      Pain Edu? --      Excl. in [place]? --    No data found.  Updated Vital Signs BP (!) 158/85 (BP Location: Right Arm)   Pulse 65   Temp 98 F (36.7 C) (Oral)   Resp 20   LMP 07/20/1990 (Approximate)   SpO2 93%   Visual Acuity Right Eye Distance:   Left Eye Distance:   Bilateral Distance:    Right Eye Near:   Left Eye Near:    Bilateral Near:     Physical Exam Vitals and nursing note reviewed.  Constitutional:      Appearance: Normal appearance. She is not ill-appearing.  HENT:     Head: Atraumatic.  Eyes:     Extraocular Movements: Extraocular movements intact.     Conjunctiva/sclera: Conjunctivae normal.  Cardiovascular:     Rate and Rhythm: Normal rate and regular rhythm.     Heart sounds: Normal heart sounds.  Pulmonary:     Effort: Pulmonary effort is normal.     Breath sounds: Normal breath sounds.  Musculoskeletal:        General: Normal range of motion.     Cervical back: Normal range of motion and neck supple.     Comments: Mild left posterior buttock tenderness to palpation No midline spinal tenderness to palpation diffusely.  Negative straight leg raise and normal gait and range of motion  Skin:    General: Skin is warm and dry.  Neurological:     Mental Status: She  is alert and oriented to person, place, and time.     Motor: No weakness.     Gait: Gait normal.  Psychiatric:        Mood and Affect: Mood normal.        Thought Content: Thought content normal.        Judgment: Judgment normal.      UC Treatments / Results  Labs (all labs ordered are listed, but only abnormal results are displayed) Labs Reviewed - No data to display  EKG   Radiology No results found.  Procedures Procedures (including critical care time)  Medications Ordered in UC Medications  dexamethasone (DECADRON) injection 10 mg (10 mg Intramuscular Given 08/06/22 1345)    Initial Impression / Assessment and Plan / UC Course  I have reviewed the triage vital signs and the nursing notes.  Pertinent labs & imaging results that were available during my care of the patient were reviewed by me and considered in my medical decision making (see chart for details).     Consistent with piriformis syndrome, treat with IM Decadron, Zanaflex, stretches, heat, massage.  Return for worsening symptoms.  No red flag findings today.  Final Clinical Impressions(s) / UC Diagnoses   Final diagnoses:  Piriformis syndrome of left side   Discharge Instructions   None    ED Prescriptions     Medication Sig Dispense Auth. Provider   tizanidine (ZANAFLEX) 2 MG capsule Take 1 capsule (2 mg total) by mouth 3 (three) times daily as needed for muscle spasms. Do  not drink alcohol or drive while taking this medication. May cause drowsiness 15 capsule Volney American, [place]      PDMP not reviewed this encounter.   Volney American, [place] 08/06/22 1349

## 2022-08-06 NOTE — ED Triage Notes (Signed)
Pt reports left hip/buttock pain for last several days. Denies any known injury and reports with ambulation sends pain up left leg and reports increase in pain with left arm movement as well. Has taken arthritis strength medication and reports minimal change in pain.

## 2022-08-11 ENCOUNTER — Ambulatory Visit
Admission: EM | Admit: 2022-08-11 | Discharge: 2022-08-11 | Disposition: A | Discharge status: Home / Self Care | Payer: Medicare PPO

## 2022-08-11 DIAGNOSIS — M533 Sacrococcygeal disorders, not elsewhere classified: Secondary | ICD-10-CM

## 2022-08-11 HISTORY — DX: Unspecified asthma, uncomplicated: J45.909

## 2022-08-11 HISTORY — DX: Malignant (primary) neoplasm, unspecified: C80.1

## 2022-08-11 HISTORY — DX: Heart failure, unspecified: I50.9

## 2022-08-11 HISTORY — DX: Encounter for other specified aftercare: Z51.89

## 2022-08-11 NOTE — ED Provider Notes (Signed)
[facility]    CSN: [id number] Arrival date & time: 08/11/22  1116      History   Chief Complaint No chief complaint on file.   HPI [name] is a 82 y.o. female.   Patient presents today for ongoing left hip pain.  Reports she was seen 5 days ago in urgent care and has taken the medication as prescribed without much benefit.  Denies any recent injury, fall, or trauma to the hip.  Reports the pain is severe and intermittent.  Certain movements/stretches will make the pain go away, then certain things make the pain "catch."  She denies any saddle anesthesia, new bowel or bladder incontinence, numbness or tingling shooting down her leg to her toes, or weakness in her lower extremity.  No decreased sensation, swelling, bruising, or redness in the left hip.  Has been taking tizanidine, ibuprofen, Tylenol, topical arthritis gel, and has tried ice/heat without much benefit.    Past Medical History:  Diagnosis Date   Allergy    Anemia    prior to hysterectomy   Asthma    Back pain    comepensating for knee pain per pt   Blood transfusion without reported diagnosis    Cancer ([place])    Cataract    right eye and immature   CHF (congestive heart failure) ([place])    Complication of anesthesia    Constipation    takes Colace daily and Milk of Mag   CTS (carpal tunnel syndrome) 08/31/2016   Dizziness    but goes away real quick   Dry eyes    uses Eye Drops daily as needed   Encounter for general adult medical examination with abnormal findings 07/16/2021   Female hypogonadism syndrome    GERD (gastroesophageal reflux disease)    History of colon polyps    Hyperlipidemia    was on meds but stopped taking back in June 2015   Hypertension    takes Lisinopril daily   Hypothyroidism    takes Synthroid daily   Joint pain    Obesity (BMI 30.0-34.9)    Peripheral arterial disease (HCC)    Pneumonia    as a baby   PONV (postoperative nausea and vomiting)    Primary  localized osteoarthritis of left knee 04/17/2015   Primary localized osteoarthritis of right knee    knees   Stage 3a chronic kidney disease ([place]) 10/14/2021   Urinary frequency    Urinary urgency     Patient Active Problem List   Diagnosis Date Noted   Palpitations 07/21/2022   Gastroesophageal reflux disease without esophagitis 02/13/2022   Hemorrhoids 01/07/2022   Stage 3a chronic kidney disease ([place]) 10/14/2021   Encounter for general adult medical examination with abnormal findings 07/16/2021   Bilateral impacted cerumen 04/25/2021   Obesity (BMI 30.0-34.9)    DJD (degenerative joint disease), cervical 10/15/2016   Vitamin D deficiency 09/28/2016   Knee joint replacement status, bilateral 08/31/2016   Chronic constipation 08/31/2016   CTS (carpal tunnel syndrome) 08/31/2016   Hyperlipidemia 04/19/2015   Peripheral arterial disease ([place]) 04/19/2015   DJD (degenerative joint disease) of knee 04/04/2014   Hypertension    Hypothyroidism    Recurrent urinary tract infection    Rectal bleeding 10/31/2013   FH: colon cancer 10/31/2013    Past Surgical History:  Procedure Laterality Date   ABDOMINAL HYSTERECTOMY  90's   complete   APPENDECTOMY  80's   BACK SURGERY     CATARACT EXTRACTION  W/PHACO Right 12/23/2015   Procedure: CATARACT EXTRACTION PHACO AND INTRAOCULAR LENS PLACEMENT RIGHT EYE CDE=6.42;  Surgeon: Williams Che, MD;  Location: [facility];  Service: Ophthalmology;  Laterality: Right;   CATARACT EXTRACTION W/PHACO Left 05/11/2022   Procedure: CATARACT EXTRACTION PHACO AND INTRAOCULAR LENS PLACEMENT (IOC);  Surgeon: Baruch Goldmann, MD;  Location: [facility];  Service: Ophthalmology;  Laterality: Left;  CDE 10.71   COLONOSCOPY  09/09/2004   [id number] rectum and colon   COLONOSCOPY N/A 11/08/2013   Procedure: COLONOSCOPY;  Surgeon: Daneil Dolin, MD;  [facility];  Service: Endoscopy;  Laterality: N/A;  10:45   COLONOSCOPY WITH PROPOFOL N/A 06/30/2021    Surgeon: Daneil Dolin, MD; diverticulosis in sigmoid, descending, transverse colon, nonbleeding internal hemorrhoids, otherwise normal exam.  No repeat due to age.   JOINT REPLACEMENT     2015, 2016   [place]   RECTOCELE REPAIR  10/2007   SPINE SURGERY     ruptured disc   TOTAL KNEE ARTHROPLASTY Right 04/04/2014   DR Noemi Chapel   TOTAL KNEE ARTHROPLASTY Right 04/04/2014   Procedure: RIGHT TOTAL KNEE ARTHROPLASTY;  Surgeon: Lorn Junes, MD;  Location: [place];  Service: Orthopedics;  Laterality: Right;   TOTAL KNEE ARTHROPLASTY Left 04/29/2015   Procedure: TOTAL KNEE ARTHROPLASTY;  Surgeon: Elsie Saas, MD;  Location: [place];  Service: Orthopedics;  Laterality: Left;   TUBAL LIGATION      OB History   No obstetric history on file.      Home Medications    Prior to Admission medications   Medication Sig Start Date End Date Taking? Authorizing Provider  acetaminophen (TYLENOL) 500 MG tablet Take 500 mg by mouth 2 (two) times daily as needed for moderate pain.    [provider]  aspirin EC 81 MG tablet Take 81 mg by mouth daily.    [provider]  b complex vitamins capsule Take 1 capsule by mouth daily.    [provider]  Cholecalciferol (VITAMIN D) 50 MCG (2000 UT) tablet Take 2,000 Units by mouth daily.    [provider]  levothyroxine (SYNTHROID) 88 MCG tablet Take 1 tablet (88 mcg total) by mouth daily before breakfast. 07/21/22   Lindell Spar, MD  lisinopril (ZESTRIL) 10 MG tablet TAKE ONE TABLET ('10MG'$  TOTAL) BY MOUTH DAILY 05/25/22   Lindell Spar, MD  magnesium hydroxide (MILK OF MAGNESIA) 400 MG/5ML suspension Take 15 mLs by mouth daily as needed for moderate constipation.    [provider]  polyethylene glycol powder (GLYCOLAX/MIRALAX) 17 GM/SCOOP powder Take 1 Container by mouth once. As needed    [provider]  tizanidine (ZANAFLEX) 2 MG capsule Take 1 capsule (2 mg total) by mouth 3 (three) times  daily as needed for muscle spasms. Do not drink alcohol or drive while taking this medication. May cause drowsiness 08/06/22   Volney American, PA-C    Family History Family History  Problem Relation Age of Onset   Colon cancer Sister        age 79s   Myasthenia gravis Sister    Bladder Cancer Brother    Lung cancer Brother        age 91   Alzheimer's disease Mother    COPD Father    Asthma Father    Alcohol abuse Brother        likely died of pneumonia    Social History Social History   Tobacco Use  Smoking status: Never   Smokeless tobacco: Never  Vaping Use   Vaping Use: Never used  Substance Use Topics   Alcohol use: No   Drug use: No     Allergies   Codeine, Crestor [rosuvastatin], Flexall [menthol (topical analgesic)], and Penicillins   Review of Systems Review of Systems Per HPI  Physical Exam Triage Vital Signs ED Triage Vitals  Enc Vitals Group     BP 08/11/22 1130 (!) 154/77     Pulse Rate 08/11/22 1130 (!) 57     Resp 08/11/22 1130 20     Temp 08/11/22 1130 98 F (36.7 C)     Temp Source 08/11/22 1130 Oral     SpO2 08/11/22 1130 99 %     Weight --      Height --      Head Circumference --      Peak Flow --      Pain Score 08/11/22 1132 10     Pain Loc --      Pain Edu? --      Excl. in [place]? --    No data found.  Updated Vital Signs BP (!) 154/77 (BP Location: Right Arm)   Pulse (!) 57   Temp 98 F (36.7 C) (Oral)   Resp 20   LMP 07/20/1990 (Approximate)   SpO2 99%   Visual Acuity Right Eye Distance:   Left Eye Distance:   Bilateral Distance:    Right Eye Near:   Left Eye Near:    Bilateral Near:     Physical Exam Vitals and nursing note reviewed.  Constitutional:      General: She is not in acute distress.    Appearance: Normal appearance. She is not toxic-appearing.  HENT:     Mouth/Throat:     Mouth: Mucous membranes are moist.     Pharynx: Oropharynx is clear.  Pulmonary:     Effort: Pulmonary effort is  normal. No respiratory distress.  Musculoskeletal:       Legs:     Comments: Inspection: No swelling, obvious deformity, or redness to left hip  Palpation: Left SI joint tender to palpation approximately and marked; no obvious deformities palpated ROM: Full ROM to left lower extremity Strength: 5/5 left lower extremity Neurovascular: neurovascularly intact in left and right lower extremity    Skin:    General: Skin is warm and dry.     Capillary Refill: Capillary refill takes less than 2 seconds.     Coloration: Skin is not jaundiced or pale.     Findings: No erythema.  Neurological:     Mental Status: She is alert and oriented to person, place, and time.  Psychiatric:        Behavior: Behavior is cooperative.      UC Treatments / Results  Labs (all labs ordered are listed, but only abnormal results are displayed) Labs Reviewed - No data to display  EKG   Radiology No results found.  Procedures Procedures (including critical care time)  Medications Ordered in UC Medications - No data to display  Initial Impression / Assessment and Plan / UC Course  I have reviewed the triage vital signs and the nursing notes.  Pertinent labs & imaging results that were available during my care of the patient were reviewed by me and considered in my medical decision making (see chart for details).   Patient is well-appearing, normotensive, afebrile, not tachycardic, not tachypneic, oxygenating well on room air.    Sacroiliac  joint pain Discussed with patient that pain may persist for a few weeks before full improvement Noted flags in history or on exam today Recommended continued use of Tylenol 500 mg every 6 hours, tizanidine, stretches, ice/heat Needs to avoid NSAIDs secondary to chronic kidney disease Recommended close follow-up with primary care provider if no improvement or worsening symptoms despite treatment  The patient was given the opportunity to ask questions.  All  questions answered to their satisfaction.  The patient is in agreement to this plan.    Final Clinical Impressions(s) / UC Diagnoses   Final diagnoses:  Sacroiliac joint pain     Discharge Instructions      As we discussed, please start taking Tylenol 500 mg every 6 hours as needed for pain; you can also try the Tens unit to see if that will help.  Continue the exercises regularly.  Follow up with PCP later this week with no improvement in symptoms.    ED Prescriptions   None    PDMP not reviewed this encounter.   Eulogio Bear, NP 08/11/22 1231

## 2022-08-11 NOTE — Discharge Instructions (Addendum)
As we discussed, please start taking Tylenol 500 mg every 6 hours as needed for pain; you can also try the Tens unit to see if that will help.  Continue the exercises regularly.  Follow up with PCP later this week with no improvement in symptoms.

## 2022-08-11 NOTE — ED Triage Notes (Signed)
Pt reports with left side hip pain that seems to pop out of place. She was seen last Thursday for the hip pain and took the muscle relaxer that she was prescribed but no relief.

## 2022-08-12 ENCOUNTER — Ambulatory Visit: Payer: Medicare PPO | Admitting: Internal Medicine

## 2022-08-12 ENCOUNTER — Encounter: Payer: Self-pay | Admitting: Internal Medicine

## 2022-08-12 VITALS — BP 110/70 | HR 57 | Ht 63.0 in | Wt 181.2 lb

## 2022-08-12 DIAGNOSIS — M5432 Sciatica, left side: Secondary | ICD-10-CM | POA: Diagnosis not present

## 2022-08-12 MED ORDER — PREDNISONE 20 MG PO TABS: 40.0000 mg | ORAL_TABLET | Freq: Every day | ORAL | 0 refills | Status: DC

## 2022-08-12 NOTE — Patient Instructions (Signed)
Please take Prednisone as prescribed.  Please avoid heavy lifting and frequent bending.  Please take Tylenol as needed for back pain.

## 2022-08-12 NOTE — Progress Notes (Signed)
Acute Office Visit  Subjective:    Patient ID: [name], female    DOB: 1941-01-14, 82 y.o.   MRN: [id number]  Chief Complaint  Patient presents with   Hip Pain    Patient is complaining of hip pain, she has already seen Urgent Care twice and is still not getting relief    HPI Patient is in today for c/o low back pain since 08/05/22, which is constant, dull, radiating to left posterior buttock area.  Her pain is better with standing straight.  Denies any heavy lifting or frequent bending.  Denies any recent injury or fall.  She went to urgent care and was given Decadron 10 mg IM and Flexeril as needed.  She has not had any relief with Flexeril.  She has tried ibuprofen and Tylenol with minimal relief.  Past Medical History:  Diagnosis Date   Allergy    Anemia    prior to hysterectomy   Asthma    Back pain    comepensating for knee pain per pt   Blood transfusion without reported diagnosis    Cancer ([place])    Cataract    right eye and immature   CHF (congestive heart failure) ([place])    Complication of anesthesia    Constipation    takes Colace daily and Milk of Mag   CTS (carpal tunnel syndrome) 08/31/2016   Dizziness    but goes away real quick   Dry eyes    uses Eye Drops daily as needed   Encounter for general adult medical examination with abnormal findings 07/16/2021   Female hypogonadism syndrome    GERD (gastroesophageal reflux disease)    History of colon polyps    Hyperlipidemia    was on meds but stopped taking back in June 2015   Hypertension    takes Lisinopril daily   Hypothyroidism    takes Synthroid daily   Joint pain    Obesity (BMI 30.0-34.9)    Peripheral arterial disease (HCC)    Pneumonia    as a baby   PONV (postoperative nausea and vomiting)    Primary localized osteoarthritis of left knee 04/17/2015   Primary localized osteoarthritis of right knee    knees   Stage 3a chronic kidney disease ([place]) 10/14/2021   Urinary frequency     Urinary urgency     Past Surgical History:  Procedure Laterality Date   ABDOMINAL HYSTERECTOMY  90's   complete   APPENDECTOMY  80's   BACK SURGERY     CATARACT EXTRACTION W/PHACO Right 12/23/2015   Procedure: CATARACT EXTRACTION PHACO AND INTRAOCULAR LENS PLACEMENT RIGHT EYE CDE=6.42;  Surgeon: Williams Che, MD;  Location: [facility];  Service: Ophthalmology;  Laterality: Right;   CATARACT EXTRACTION W/PHACO Left 05/11/2022   Procedure: CATARACT EXTRACTION PHACO AND INTRAOCULAR LENS PLACEMENT (IOC);  Surgeon: Baruch Goldmann, MD;  Location: [facility];  Service: Ophthalmology;  Laterality: Left;  CDE 10.71   COLONOSCOPY  09/09/2004   [id number] rectum and colon   COLONOSCOPY N/A 11/08/2013   Procedure: COLONOSCOPY;  Surgeon: Daneil Dolin, MD;  [facility];  Service: Endoscopy;  Laterality: N/A;  10:45   COLONOSCOPY WITH PROPOFOL N/A 06/30/2021   Surgeon: Daneil Dolin, MD; diverticulosis in sigmoid, descending, transverse colon, nonbleeding internal hemorrhoids, otherwise normal exam.  No repeat due to age.   JOINT REPLACEMENT     2015, 2016   [place] REPAIR  10/2007  SPINE SURGERY     ruptured disc   TOTAL KNEE ARTHROPLASTY Right 04/04/2014   DR Noemi Chapel   TOTAL KNEE ARTHROPLASTY Right 04/04/2014   Procedure: RIGHT TOTAL KNEE ARTHROPLASTY;  Surgeon: Lorn Junes, MD;  Location: [place];  Service: Orthopedics;  Laterality: Right;   TOTAL KNEE ARTHROPLASTY Left 04/29/2015   Procedure: TOTAL KNEE ARTHROPLASTY;  Surgeon: Elsie Saas, MD;  Location: [facility];  Service: Orthopedics;  Laterality: Left;   TUBAL LIGATION      Family History  Problem Relation Age of Onset   Colon cancer Sister        age 58s   Myasthenia gravis Sister    Bladder Cancer Brother    Lung cancer Brother        age 82   Alzheimer's disease Mother    COPD Father    Asthma Father    Alcohol abuse Brother        likely died of pneumonia    Social History    Socioeconomic History   Marital status: Widowed    Spouse name: Not on file   Number of children: 2   Years of education: 52   Highest education level: Not on file  Occupational History   Occupation: retired    Comment: Doctor, general practice  Tobacco Use   Smoking status: Never   Smokeless tobacco: Never  Scientific laboratory technician Use: Never used  Substance and Sexual Activity   Alcohol use: No   Drug use: No   Sexual activity: Never    Birth control/protection: Surgical  Other Topics Concern   Not on file  Social History Narrative   Husband died after over 76 years and then a year after husband died, she met a man who she had a relationship with for over ten years. He died in 09-11-2017 after long illness.   Live alone.    Has two children, grown sons.    Gets out with neices, enjoys going out to eat, going to [employer].   Goes to church.    Eats all food groups, eats some meat.       Social Determinants of Health   Financial Resource Strain: Low Risk  (06/02/2022)   Overall Financial Resource Strain (CARDIA)    Difficulty of Paying Living Expenses: Not hard at all  Food Insecurity: No Food Insecurity (06/02/2022)   Hunger Vital Sign    Worried About Running Out of Food in the Last Year: Never true    Ran Out of Food in the Last Year: Never true  Transportation Needs: No Transportation Needs (06/02/2022)   PRAPARE - Hydrologist (Medical): No    Lack of Transportation (Non-Medical): No  Physical Activity: Inactive (05/21/2021)   Exercise Vital Sign    Days of Exercise per Week: 0 days    Minutes of Exercise per Session: 0 min  Stress: No Stress Concern Present (06/02/2022)   [place]    Feeling of Stress : Not at all  Social Connections: Moderately Isolated (06/02/2022)   Social Connection and Isolation Panel [NHANES]    Frequency of Communication with Friends and Family: More  than three times a week    Frequency of Social Gatherings with Friends and Family: More than three times a week    Attends Religious Services: More than 4 times per year    Active Member of Clubs or Organizations: No    Attends  Club or Organization Meetings: Never    Marital Status: Widowed  Intimate Partner Violence: Not At Risk (05/21/2021)   Humiliation, Afraid, Rape, and Kick questionnaire    Fear of Current or Ex-Partner: No    Emotionally Abused: No    Physically Abused: No    Sexually Abused: No    Outpatient Medications Prior to Visit  Medication Sig Dispense Refill   acetaminophen (TYLENOL) 500 MG tablet Take 500 mg by mouth 2 (two) times daily as needed for moderate pain.     aspirin EC 81 MG tablet Take 81 mg by mouth daily.     b complex vitamins capsule Take 1 capsule by mouth daily.     Cholecalciferol (VITAMIN D) 50 MCG (2000 UT) tablet Take 2,000 Units by mouth daily.     levothyroxine (SYNTHROID) 88 MCG tablet Take 1 tablet (88 mcg total) by mouth daily before breakfast. 90 tablet 1   lisinopril (ZESTRIL) 10 MG tablet TAKE ONE TABLET ('10MG'$  TOTAL) BY MOUTH DAILY 90 tablet 0   magnesium hydroxide (MILK OF MAGNESIA) 400 MG/5ML suspension Take 15 mLs by mouth daily as needed for moderate constipation.     polyethylene glycol powder (GLYCOLAX/MIRALAX) 17 GM/SCOOP powder Take 1 Container by mouth once. As needed     tizanidine (ZANAFLEX) 2 MG capsule Take 1 capsule (2 mg total) by mouth 3 (three) times daily as needed for muscle spasms. Do not drink alcohol or drive while taking this medication. May cause drowsiness 15 capsule 0   No facility-administered medications prior to visit.    Allergies  Allergen Reactions   Codeine Nausea And Vomiting   Crestor [Rosuvastatin] Other (See Comments)    Body aches   Flexall [Menthol (Topical Analgesic)] Rash   Penicillins Rash    Review of Systems  Constitutional:  Negative for chills and fever.  HENT:  Negative for congestion,  sinus pressure, sinus pain and sore throat.   Eyes:  Negative for pain and discharge.  Respiratory:  Negative for cough and shortness of breath.   Cardiovascular:  Positive for palpitations. Negative for chest pain.  Gastrointestinal:  Negative for abdominal pain, diarrhea, nausea and vomiting.  Endocrine: Negative for polydipsia and polyuria.  Genitourinary:  Negative for dysuria and hematuria.  Musculoskeletal:  Positive for arthralgias and back pain. Negative for neck pain and neck stiffness.  Skin:  Negative for rash.  Neurological:  Positive for numbness. Negative for dizziness and weakness.  Psychiatric/Behavioral:  Negative for agitation and behavioral problems. The patient is not nervous/anxious.        Objective:    Physical Exam Vitals reviewed.  Constitutional:      General: She is not in acute distress.    Appearance: She is not diaphoretic.  Eyes:     General: No scleral icterus.    Extraocular Movements: Extraocular movements intact.  Cardiovascular:     Rate and Rhythm: Normal rate and regular rhythm.     Heart sounds: Normal heart sounds. No murmur heard. Pulmonary:     Breath sounds: Normal breath sounds. No wheezing or rales.  Musculoskeletal:     Cervical back: Neck supple. No tenderness.     Lumbar back: Tenderness present. Decreased range of motion. Positive left straight leg raise test. Negative right straight leg raise test.     Right lower leg: No edema.     Left lower leg: No edema.  Skin:    General: Skin is warm.     Findings: No rash.  Neurological:  General: No focal deficit present.     Mental Status: She is alert and oriented to person, place, and time.     Sensory: No sensory deficit.     Motor: No weakness.  Psychiatric:        Mood and Affect: Mood normal.        Behavior: Behavior normal.     BP 110/70 (BP Location: Left Arm, Patient Position: Sitting, Cuff Size: Normal)   Pulse (!) 57   Ht '5\' 3"'$  (1.6 m)   Wt 181 lb 3.2 oz (82.2  kg)   LMP 07/20/1990 (Approximate)   SpO2 94%   BMI 32.10 kg/m  Wt Readings from Last 3 Encounters:  08/12/22 181 lb 3.2 oz (82.2 kg)  07/21/22 178 lb 9.6 oz (81 kg)  06/02/22 174 lb (78.9 kg)        Assessment & Plan:   Problem List Items Addressed This Visit       Nervous and Auditory   Sciatica of left side - Primary    Left-sided low back pain with radiating symptoms Prednisone 40 mg QD X 5 days Tylenol as needed for pain Flexeril as needed for muscle spasms Avoid heavy lifting and frequent bending Referred to PT If persistent pain, will get imaging      Relevant Medications   predniSONE (DELTASONE) 20 MG tablet   Other Relevant Orders   Ambulatory referral to Physical Therapy     Meds ordered this encounter  Medications   predniSONE (DELTASONE) 20 MG tablet    Sig: Take 2 tablets (40 mg total) by mouth daily with breakfast.    Dispense:  10 tablet    Refill:  0     Analyah Mcconnon Keith Rake, MD

## 2022-08-12 NOTE — Assessment & Plan Note (Signed)
Left-sided low back pain with radiating symptoms Prednisone 40 mg QD X 5 days Tylenol as needed for pain Flexeril as needed for muscle spasms Avoid heavy lifting and frequent bending Referred to PT If persistent pain, will get imaging

## 2022-08-19 ENCOUNTER — Ambulatory Visit (HOSPITAL_COMMUNITY): Payer: Medicare PPO | Attending: Internal Medicine

## 2022-08-19 ENCOUNTER — Other Ambulatory Visit: Payer: Self-pay

## 2022-08-19 DIAGNOSIS — M533 Sacrococcygeal disorders, not elsewhere classified: Secondary | ICD-10-CM

## 2022-08-19 DIAGNOSIS — M5432 Sciatica, left side: Secondary | ICD-10-CM | POA: Diagnosis not present

## 2022-08-19 DIAGNOSIS — M5459 Other low back pain: Secondary | ICD-10-CM | POA: Insufficient documentation

## 2022-08-19 NOTE — Therapy (Signed)
OUTPATIENT PHYSICAL THERAPY THORACOLUMBAR EVALUATION   Patient Name: [name] MRN: [id number] DOB:[birth year], 82 y.o., female Today's Date: 08/19/2022  END OF SESSION:  PT End of Session - 08/19/22 1527     Visit Number 1    Number of Visits 16    Date for PT Re-Evaluation 10/14/22    Authorization Type [employer] Medicare (auth required but no visit limits)    Progress Note Due on Visit 10    PT Start Time 1430    PT Stop Time 1515    PT Time Calculation (min) 45 min    Activity Tolerance Patient limited by pain             Past Medical History:  Diagnosis Date   Allergy    Anemia    prior to hysterectomy   Asthma    Back pain    comepensating for knee pain per pt   Blood transfusion without reported diagnosis    Cancer ([place])    Cataract    right eye and immature   CHF (congestive heart failure) ([place])    Complication of anesthesia    Constipation    takes Colace daily and Milk of Mag   CTS (carpal tunnel syndrome) 08/31/2016   Dizziness    but goes away real quick   Dry eyes    uses Eye Drops daily as needed   Encounter for general adult medical examination with abnormal findings 07/16/2021   Female hypogonadism syndrome    GERD (gastroesophageal reflux disease)    History of colon polyps    Hyperlipidemia    was on meds but stopped taking back in June 2015   Hypertension    takes Lisinopril daily   Hypothyroidism    takes Synthroid daily   Joint pain    Obesity (BMI 30.0-34.9)    Peripheral arterial disease (HCC)    Pneumonia    as a baby   PONV (postoperative nausea and vomiting)    Primary localized osteoarthritis of left knee 04/17/2015   Primary localized osteoarthritis of right knee    knees   Stage 3a chronic kidney disease ([place]) 10/14/2021   Urinary frequency    Urinary urgency    Past Surgical History:  Procedure Laterality Date   ABDOMINAL HYSTERECTOMY  90's   complete   APPENDECTOMY  80's   BACK SURGERY     CATARACT EXTRACTION  W/PHACO Right 12/23/2015   Procedure: CATARACT EXTRACTION PHACO AND INTRAOCULAR LENS PLACEMENT RIGHT EYE CDE=6.42;  Surgeon: Williams Che, MD;  [facility]: [facility];  Service: Ophthalmology;  Laterality: Right;   CATARACT EXTRACTION W/PHACO Left 05/11/2022   Procedure: CATARACT EXTRACTION PHACO AND INTRAOCULAR LENS PLACEMENT (IOC);  Surgeon: Baruch Goldmann, MD;  Location: [facility];  Service: Ophthalmology;  Laterality: Left;  CDE 10.71   COLONOSCOPY  09/09/2004   TDD:UKGURK rectum and colon   COLONOSCOPY N/A 11/08/2013   Procedure: COLONOSCOPY;  Surgeon: Daneil Dolin, MD;  [facility];  Service: Endoscopy;  Laterality: N/A;  10:45   COLONOSCOPY WITH PROPOFOL N/A 06/30/2021   Surgeon: Daneil Dolin, MD; diverticulosis in sigmoid, descending, transverse colon, nonbleeding internal hemorrhoids, otherwise normal exam.  No repeat due to age.   JOINT REPLACEMENT     2015, 2016   [place]   RECTOCELE REPAIR  10/2007   SPINE SURGERY     ruptured disc   TOTAL KNEE ARTHROPLASTY Right 04/04/2014   DR Noemi Chapel   TOTAL KNEE ARTHROPLASTY  Right 04/04/2014   Procedure: RIGHT TOTAL KNEE ARTHROPLASTY;  Surgeon: Lorn Junes, MD;  Location: [place];  Service: Orthopedics;  Laterality: Right;   TOTAL KNEE ARTHROPLASTY Left 04/29/2015   Procedure: TOTAL KNEE ARTHROPLASTY;  Surgeon: Elsie Saas, MD;  Location: [place];  Service: Orthopedics;  Laterality: Left;   TUBAL LIGATION     Patient Active Problem List   Diagnosis Date Noted   Sciatica of left side 08/12/2022   Palpitations 07/21/2022   Gastroesophageal reflux disease without esophagitis 02/13/2022   Hemorrhoids 01/07/2022   Stage 3a chronic kidney disease ([place]) 10/14/2021   Encounter for general adult medical examination with abnormal findings 07/16/2021   Bilateral impacted cerumen 04/25/2021   Obesity (BMI 30.0-34.9)    DJD (degenerative joint disease), cervical 10/15/2016   Vitamin D deficiency 09/28/2016   Knee joint  replacement status, bilateral 08/31/2016   Chronic constipation 08/31/2016   CTS (carpal tunnel syndrome) 08/31/2016   Hyperlipidemia 04/19/2015   Peripheral arterial disease ([place]) 04/19/2015   DJD (degenerative joint disease) of knee 04/04/2014   Hypertension    Hypothyroidism    Recurrent urinary tract infection    Rectal bleeding 10/31/2013   FH: colon cancer 10/31/2013    PCP: Lindell Spar, MD  REFERRING PROVIDER: Lindell Spar, MD  REFERRING DIAG: [phone] (ICD-10-CM) - Sciatica of left side  Rationale for Evaluation and Treatment: Rehabilitation  THERAPY DIAG:  Sciatica, left side  Sacroiliac joint dysfunction of left side  Other low back pain  ONSET DATE: 3 weeks ago  SUBJECTIVE:                                                                                                                                                                                           SUBJECTIVE STATEMENT: Patient currently reports of low back pain just above the L buttocks at around 3/10 on NPRS. Condition started around 3 weeks ago and gradually got worse without reason. Patient attributes the pain to sitting in a recliner for the past months or transitioning to a firm mattress from her old mattress. Pain got really worse over the last days that she has to go to the urgent care 2x where patient was given injections and pain meds which did not help. Patient noticed that her legs are swollen and have noticed the swelling since the back started to hurt. Patient denies any imaging done. Patient then went to her PCP and was just referred to PT evaluation and management.  PERTINENT HISTORY:  B TKR, asthma, HTN  PAIN:  Are you having pain? Yes: NPRS scale: 3/10 Pain location: just above the L buttocks  Pain description: stabbing, aching Aggravating factors: turning around, moving in the bed Relieving factors: "stretching" the trunk (arching the back)  WEIGHT BEARING RESTRICTIONS:  No  FALLS:  Has patient fallen in last 6 months? No  LIVING ENVIRONMENT: Lives with: lives alone Lives in: House/apartment Stairs: Yes: External: 2 steps; can reach both Has following equipment at home: Single point cane, Quad cane small base, and Walker - 4 wheeled  OCCUPATION: retired  PLOF: Independent  PATIENT GOALS: "for the pain to absolutely stop"   OBJECTIVE:   B LE swollen. No redness observed  DIAGNOSTIC FINDINGS:  None performed  PATIENT SURVEYS:  FOTO 39.75  SCREENING FOR RED FLAGS: Bowel or bladder incontinence: No Spinal tumors: No COGNITION: Overall cognitive status: Within functional limits for tasks assessed     SENSATION: Not tested  MUSCLE LENGTH: Mild tightness on B hamstrings, hip flexors and piriformis   POSTURE: rounded shoulders, forward head, increased lumbar lordosis, and L LE longer in supine (did not get shorter/longer in supine-to-sit), L ASIS higher in supine and standing (potential L pelvic upslip)  PALPATION: Grade 1 tenderness on L PSIS  LUMBAR ROM:   AROM eval  Flexion 100%  Extension 50%  Right lateral flexion   Left lateral flexion   Right rotation 75%  Left rotation 75%   (Blank rows = not tested)  LOWER EXTREMITY ROM:     Active  Right eval Left eval  Hip flexion [facility]  Hip extension    Hip abduction [facility]  Hip adduction [facility]  Hip internal rotation    Hip external rotation    Knee flexion [facility]  Knee extension [facility]  Ankle dorsiflexion [facility]  Ankle plantarflexion [facility] WFL  Ankle inversion    Ankle eversion     (Blank rows = not tested) Repeated forward flex/ext in standing did not affect the pain.  LOWER EXTREMITY MMT:    MMT Right eval Left eval  Hip flexion 4 4  Hip extension 3+ 3+  Hip abduction 4- 4-  Hip adduction    Hip internal rotation    Hip external rotation    Knee flexion 4+ 4+  Knee extension 4+ 4+  Ankle dorsiflexion 4+ 4+  Ankle plantarflexion 4+ 4+  Ankle  inversion    Ankle eversion     (Blank rows = not tested)  LUMBAR SPECIAL TESTS:  H & I test: pain in multiple quadrants Forward bending test: L PSIS has more forward excursion than L (+) Thomas test B (+) Thigh thrust test L (+) Compression test L (-) Distraction test on B (-) SLR on B  (-) Slump test on B (-) Piriformis test  FUNCTIONAL TESTS:  2 minute walk test: 405 ft. . Patient reports that walking alleviates the pain.  GAIT: Distance walked: 405 Assistive device utilized: None Level of assistance: Complete Independence Comments: slightly antalgic  TODAY'S TREATMENT:  DATE:  08/19/21 Evaluation and patient education done (including self-management techniques)   PATIENT EDUCATION:  Education details: Educated on the pathoanatomy of sacroiliac joint dysfunction. Educated on the goals and course of rehab. Educated on the use of back brace only when necessary. Person educated: Patient Education method: Explanation Education comprehension: verbalized understanding  HOME EXERCISE PROGRAM: None provide to date  ASSESSMENT:  CLINICAL IMPRESSION: Patient is a 82 y.o. female who was seen today for physical therapy evaluation and treatment for sciatica on L side. Patient was diagnosed with L side sciatica by referring provider further defined by difficulty with bed mobility due to pain, weakness, joint hypomobility, and decreased soft tissue extensibility. However, patient doesn't exhibit signs of neural tension as patient is negative for SLR and Slump test. Patient maybe potentially exhibiting signs of sacroiliac joint dysfunction (hypomobility) on the L side as patient is positive on the forward bending test, thigh thrust test, and compression test. In addition, patient may present a L pelvic upslip as the L ASIS and PSIS are higher in supine and standing  respectively. Skilled PT is required to address the impairments and functional limitations listed below. Patient is concerned about her legs being swollen. PT messaged Dr. Posey Pronto via Epic today about patient's concerns. Dr. Posey Pronto responded saying that the swelling is from the prednisone that was given.   OBJECTIVE IMPAIRMENTS: decreased mobility, decreased ROM, decreased strength, hypomobility, impaired flexibility, and pain.   ACTIVITY LIMITATIONS: lifting, bending, and bed mobility  PARTICIPATION LIMITATIONS: meal prep, cleaning, laundry, and community activity  PERSONAL FACTORS: Age are also affecting patient's functional outcome.   REHAB POTENTIAL: Good  CLINICAL DECISION MAKING: Stable/uncomplicated  EVALUATION COMPLEXITY: Low   GOALS: Goals reviewed with patient? Yes  SHORT TERM GOALS: Target date: 09/16/22  Ptwill demonstrate indep in HEP to facilitate carry-over of skilled services and improve functional outcomes Goal status: INITIAL  2.  Pt will report decrease in pain to 2 to facilitate ease in ADLs Baseline: 3/10 Goal status: INITIAL  3.  Pt will demonstrate improvement of lumbar ext by 25%to facilitate ease in ADLs  Baseline: 50% Goal status: INITIAL  4.  Pt will demonstrate increase in LE strength to 4/5 to facilitate ease and safety in ambulation and ADLs Baseline: 3+/5 Goal status: INITIAL  LONG TERM GOALS: Target date: 10/14/22  Pt will increase FOTO to at least 60 in order to demonstrate significant improvement in function related to ADLs Baseline: 39.75 Goal status: INITIAL  2.  Pt will be able to do bed mobility with little to no pain Goal status: INITIAL PLAN:  PT FREQUENCY: 2x/week  PT DURATION: 8 weeks  PLANNED INTERVENTIONS: Therapeutic exercises, Therapeutic activity, Neuromuscular re-education, Balance training, Gait training, Patient/Family education, Self Care, Joint mobilization, and Manual therapy.  PLAN FOR NEXT SESSION: May begin core  and hip strengthening and improving SIJ mobility and stability.   Harvie Heck. Annisa Mazzarella, PT, DPT, OCS Board-Certified Clinical Specialist in [place] # ([place]): [id number] T 08/19/2022, 3:47 PM

## 2022-08-24 ENCOUNTER — Other Ambulatory Visit: Payer: Self-pay | Admitting: Internal Medicine

## 2022-08-24 DIAGNOSIS — I10 Essential (primary) hypertension: Secondary | ICD-10-CM

## 2022-08-26 ENCOUNTER — Encounter (HOSPITAL_COMMUNITY): Payer: Self-pay

## 2022-08-26 ENCOUNTER — Ambulatory Visit (HOSPITAL_COMMUNITY): Payer: Medicare PPO | Attending: Internal Medicine

## 2022-08-26 DIAGNOSIS — M5432 Sciatica, left side: Secondary | ICD-10-CM | POA: Diagnosis not present

## 2022-08-26 DIAGNOSIS — M5459 Other low back pain: Secondary | ICD-10-CM | POA: Diagnosis not present

## 2022-08-26 DIAGNOSIS — M533 Sacrococcygeal disorders, not elsewhere classified: Secondary | ICD-10-CM | POA: Insufficient documentation

## 2022-08-26 NOTE — Therapy (Signed)
OUTPATIENT PHYSICAL THERAPY THORACOLUMBAR EVALUATION   Patient Name: [name] MRN: [id number] DOB:April 07, 1941, 82 y.o., female Today's Date: 08/26/2022  END OF SESSION:  PT End of Session - 08/26/22 1436     Visit Number 2    Number of Visits 16    Date for PT Re-Evaluation 10/14/22    Authorization Type [employer] Medicare (auth required but no visit limits)    Authorization - Visit Number 1    Authorization - Number of Visits 16    Progress Note Due on Visit 10    PT Start Time 1430    PT Stop Time 1515    PT Time Calculation (min) 45 min    Activity Tolerance Patient tolerated treatment well              Past Medical History:  Diagnosis Date   Allergy    Anemia    prior to hysterectomy   Asthma    Back pain    comepensating for knee pain per pt   Blood transfusion without reported diagnosis    Cancer ([place])    Cataract    right eye and immature   CHF (congestive heart failure) ([place])    Complication of anesthesia    Constipation    takes Colace daily and Milk of Mag   CTS (carpal tunnel syndrome) 08/31/2016   Dizziness    but goes away real quick   Dry eyes    uses Eye Drops daily as needed   Encounter for general adult medical examination with abnormal findings 07/16/2021   Female hypogonadism syndrome    GERD (gastroesophageal reflux disease)    History of colon polyps    Hyperlipidemia    was on meds but stopped taking back in June 2015   Hypertension    takes Lisinopril daily   Hypothyroidism    takes Synthroid daily   Joint pain    Obesity (BMI 30.0-34.9)    Peripheral arterial disease (HCC)    Pneumonia    as a baby   PONV (postoperative nausea and vomiting)    Primary localized osteoarthritis of left knee 04/17/2015   Primary localized osteoarthritis of right knee    knees   Stage 3a chronic kidney disease ([place]) 10/14/2021   Urinary frequency    Urinary urgency    Past Surgical History:  Procedure Laterality Date   ABDOMINAL  HYSTERECTOMY  90's   complete   APPENDECTOMY  80's   BACK SURGERY     CATARACT EXTRACTION W/PHACO Right 12/23/2015   Procedure: CATARACT EXTRACTION PHACO AND INTRAOCULAR LENS PLACEMENT RIGHT EYE CDE=6.42;  Surgeon: Williams Che, MD;  [facility];  Service: Ophthalmology;  Laterality: Right;   CATARACT EXTRACTION W/PHACO Left 05/11/2022   Procedure: CATARACT EXTRACTION PHACO AND INTRAOCULAR LENS PLACEMENT (IOC);  Surgeon: Baruch Goldmann, MD;  [facility];  Service: Ophthalmology;  Laterality: Left;  CDE 10.71   COLONOSCOPY  09/09/2004   HAL:PFXTKW rectum and colon   COLONOSCOPY N/A 11/08/2013   Procedure: COLONOSCOPY;  Surgeon: Daneil Dolin, MD;  [facility];  Service: Endoscopy;  Laterality: N/A;  10:45   COLONOSCOPY WITH PROPOFOL N/A 06/30/2021   Surgeon: Daneil Dolin, MD; diverticulosis in sigmoid, descending, transverse colon, nonbleeding internal hemorrhoids, otherwise normal exam.  No repeat due to age.   JOINT REPLACEMENT     2015, 2016   [place] REPAIR  10/2007   SPINE SURGERY  ruptured disc   TOTAL KNEE ARTHROPLASTY Right 04/04/2014   DR Noemi Chapel   TOTAL KNEE ARTHROPLASTY Right 04/04/2014   Procedure: RIGHT TOTAL KNEE ARTHROPLASTY;  Surgeon: Lorn Junes, MD;  Location: [place];  Service: Orthopedics;  Laterality: Right;   TOTAL KNEE ARTHROPLASTY Left 04/29/2015   Procedure: TOTAL KNEE ARTHROPLASTY;  Surgeon: Elsie Saas, MD;  Location: [place];  Service: Orthopedics;  Laterality: Left;   TUBAL LIGATION     Patient Active Problem List   Diagnosis Date Noted   Sciatica of left side 08/12/2022   Palpitations 07/21/2022   Gastroesophageal reflux disease without esophagitis 02/13/2022   Hemorrhoids 01/07/2022   Stage 3a chronic kidney disease ([place]) 10/14/2021   Encounter for general adult medical examination with abnormal findings 07/16/2021   Bilateral impacted cerumen 04/25/2021   Obesity (BMI 30.0-34.9)    DJD  (degenerative joint disease), cervical 10/15/2016   Vitamin D deficiency 09/28/2016   Knee joint replacement status, bilateral 08/31/2016   Chronic constipation 08/31/2016   CTS (carpal tunnel syndrome) 08/31/2016   Hyperlipidemia 04/19/2015   Peripheral arterial disease ([place]) 04/19/2015   DJD (degenerative joint disease) of knee 04/04/2014   Hypertension    Hypothyroidism    Recurrent urinary tract infection    Rectal bleeding 10/31/2013   FH: colon cancer 10/31/2013    PCP: Lindell Spar, MD  REFERRING PROVIDER: Lindell Spar, MD  REFERRING DIAG: [phone] (ICD-10-CM) - Sciatica of left side  Rationale for Evaluation and Treatment: Rehabilitation  THERAPY DIAG:  Sciatica, left side  Sacroiliac joint dysfunction of left side  Other low back pain  ONSET DATE: 3 weeks ago  SUBJECTIVE:                                                                                                                                                                                           SUBJECTIVE STATEMENT: Pt reporting 8/10 in L SIJ. Sitting is worse than standing.  PERTINENT HISTORY:  B TKR, asthma, HTN  PAIN:  Are you having pain? Yes: NPRS scale: 8/10 Pain location: just above the L buttocks Pain description: stabbing, aching Aggravating factors: turning around, moving in the bed Relieving factors: "stretching" the trunk (arching the back)  WEIGHT BEARING RESTRICTIONS: No  FALLS:  Has patient fallen in last 6 months? No  LIVING ENVIRONMENT: Lives with: lives alone Lives in: House/apartment Stairs: Yes: External: 2 steps; can reach both Has following equipment at home: Single point cane, Quad cane small base, and Walker - 4 wheeled  OCCUPATION: retired  PLOF: Independent  PATIENT GOALS: "for the pain to absolutely stop"   OBJECTIVE:  08/26/2022  Treatment 1) Nustep 5': #5BLE and #10 BUE- cues for pacing and proper movement patterns  2)Side stepping with RTB at wall;  83f x 10. Cues for reduced hip ER and proper alignment of hip movements.   3) Backwards walking along wall with RTB 152fx 5; cues for proper movement patterns and reduced compensations.   4)[place]ster walks with RTB 1043f 5; cues for proper movement patterns and reduced compensations.   5)Standing hip extension with RTG around ankles; 2 x 20 repetitions; Cues for reduced trunk compensations.   6) Single arm deadlifts from elevated mat in LUE. 2 x 10 repetitions with heavy cues and demonstration for proper form.    B LE swollen. No redness observed  DIAGNOSTIC FINDINGS:  None performed  PATIENT SURVEYS:  FOTO 39.75  SCREENING FOR RED FLAGS: Bowel or bladder incontinence: No Spinal tumors: No COGNITION: Overall cognitive status: Within functional limits for tasks assessed     SENSATION: Not tested  MUSCLE LENGTH: Mild tightness on B hamstrings, hip flexors and piriformis   POSTURE: rounded shoulders, forward head, increased lumbar lordosis, and L LE longer in supine (did not get shorter/longer in supine-to-sit), L ASIS higher in supine and standing (potential L pelvic upslip)  PALPATION: Grade 1 tenderness on L PSIS  LUMBAR ROM:   AROM eval  Flexion 100%  Extension 50%  Right lateral flexion   Left lateral flexion   Right rotation 75%  Left rotation 75%   (Blank rows = not tested)  LOWER EXTREMITY ROM:     Active  Right eval Left eval  Hip flexion WFLSutter Valley Medical Foundation D[facility] Briggs[facility] Surgery CenterLGrant Medical Centerip extension    Hip abduction WFLUniversity Of Md Shore Medical Ctr At ChestertownLAllenmore Hospitalip adduction WFLCarolina Digestive Endoscopy CenterLMalcom Randall Va Medical Centerip internal rotation    Hip external rotation    Knee flexion WFLPremier Surgery Center Of Santa MariaLEye Surgery Center Of Woosternee extension WFLGlendive Medical CenterLSouth Shore Hospitalnkle dorsiflexion WFLSumma Western Reserve HospitalLHazel Hawkins Memorial Hospital D/P Snfnkle plantarflexion WFLSoutheast Missouri Mental Health CenterL  Ankle inversion    Ankle eversion     (Blank rows = not tested) Repeated forward flex/ext in standing did not affect the pain.  LOWER EXTREMITY MMT:    MMT Right eval Left eval  Hip flexion 4 4  Hip extension 3+ 3+  Hip abduction 4- 4-  Hip adduction    Hip internal rotation     Hip external rotation    Knee flexion 4+ 4+  Knee extension 4+ 4+  Ankle dorsiflexion 4+ 4+  Ankle plantarflexion 4+ 4+  Ankle inversion    Ankle eversion     (Blank rows = not tested)  LUMBAR SPECIAL TESTS:  H & I test: pain in multiple quadrants Forward bending test: L PSIS has more forward excursion than L (+) Thomas test B (+) Thigh thrust test L (+) Compression test L (-) Distraction test on B (-) SLR on B  (-) Slump test on B (-) Piriformis test  FUNCTIONAL TESTS:  2 minute walk test: 405 ft. . Patient reports that walking alleviates the pain.  GAIT: Distance walked: 405 Assistive device utilized: None Level of assistance: Complete Independence Comments: slightly antalgic  TODAY'S TREATMENT:  DATE:  08/19/21 Evaluation and patient education done (including self-management techniques)   PATIENT EDUCATION:  Education details: Educated on the pathoanatomy of sacroiliac joint dysfunction. Educated on the goals and course of rehab. Educated on the use of back brace only when necessary. Person educated: Patient Education method: Explanation Education comprehension: verbalized understanding  HOME EXERCISE PROGRAM: Access Code: [email or web address] URL: [email or web address] Date: 08/26/2022 Prepared by: Alveda Reasons  Exercises - Side Stepping with Resistance at Ankles and Counter Support  - 1 x daily - 7 x weekly - 3 sets - 10 reps - Standing 3-Way Leg Reach with Resistance at Ankles and Counter Support  - 1 x daily - 7 x weekly - 3 sets - 10 reps - Standing Hip Abduction with Resistance at Ankles and Counter Support  - 1 x daily - 7 x weekly - 3 sets - 10 repsNone provide to date  ASSESSMENT:  CLINICAL IMPRESSION: Pt reporting 10/10 pain and then 8/10 pain when educated on need for emergency services for high level of pain. Pt continues  to demonstrate bilateral hip weakness noted by increased compensations of trunk and movement patterns when performing OPC hip exercises. Fear of movement related to pain noted throughout all of session. Addressing core/hip weaknesses through single UE activities to promote increased weightbearing through LLE and opposing oblique strengthening through single arm dead lifts. Pt would continue to benefit from skilled physical therapy services to address deficits and improve BLE hip weakness in order to ease burden of pain during functional activities.       OBJECTIVE IMPAIRMENTS: decreased mobility, decreased ROM, decreased strength, hypomobility, impaired flexibility, and pain.   ACTIVITY LIMITATIONS: lifting, bending, and bed mobility  PARTICIPATION LIMITATIONS: meal prep, cleaning, laundry, and community activity  PERSONAL FACTORS: Age are also affecting patient's functional outcome.   REHAB POTENTIAL: Good  CLINICAL DECISION MAKING: Stable/uncomplicated  EVALUATION COMPLEXITY: Low   GOALS: Goals reviewed with patient? Yes  SHORT TERM GOALS: Target date: 09/16/22  Ptwill demonstrate indep in HEP to facilitate carry-over of skilled services and improve functional outcomes Goal status: INITIAL  2.  Pt will report decrease in pain to 2 to facilitate ease in ADLs Baseline: 3/10 Goal status: INITIAL  3.  Pt will demonstrate improvement of lumbar ext by 25%to facilitate ease in ADLs  Baseline: 50% Goal status: INITIAL  4.  Pt will demonstrate increase in LE strength to 4/5 to facilitate ease and safety in ambulation and ADLs Baseline: 3+/5 Goal status: INITIAL  LONG TERM GOALS: Target date: 10/14/22  Pt will increase FOTO to at least 60 in order to demonstrate significant improvement in function related to ADLs Baseline: 39.75 Goal status: INITIAL  2.  Pt will be able to do bed mobility with little to no pain Goal status: INITIAL PLAN:  PT FREQUENCY: 2x/week  PT DURATION: 8  weeks  PLANNED INTERVENTIONS: Therapeutic exercises, Therapeutic activity, Neuromuscular re-education, Balance training, Gait training, Patient/Family education, Self Care, Joint mobilization, and Manual therapy.  PLAN FOR NEXT SESSION: May begin core and hip strengthening and improving SIJ mobility and stability.   Harvie Heck. Adivino, PT, DPT, OCS Board-Certified Clinical Specialist in [place] # ([place]): [id number] T 08/26/2022, 3:55 PM

## 2022-09-01 ENCOUNTER — Ambulatory Visit (HOSPITAL_COMMUNITY): Payer: Medicare PPO | Admitting: Physical Therapy

## 2022-09-01 ENCOUNTER — Encounter (HOSPITAL_COMMUNITY): Payer: Self-pay | Admitting: Physical Therapy

## 2022-09-01 DIAGNOSIS — M5459 Other low back pain: Secondary | ICD-10-CM

## 2022-09-01 DIAGNOSIS — M5432 Sciatica, left side: Secondary | ICD-10-CM | POA: Diagnosis not present

## 2022-09-01 DIAGNOSIS — M533 Sacrococcygeal disorders, not elsewhere classified: Secondary | ICD-10-CM

## 2022-09-01 NOTE — Therapy (Signed)
OUTPATIENT PHYSICAL THERAPY TREATMENT   Patient Name: [name] MRN: [id number] DOB:Oct 02, 1940, 82 y.o., female Today's Date: 09/01/2022  END OF SESSION:  PT End of Session - 09/01/22 1034     Visit Number 3    Number of Visits 16    Date for PT Re-Evaluation 10/14/22    Authorization Type [employer] (auth required but no visit limits)    Authorization Time Period 10 visits approved 08/19/22-10/14/22    Authorization - Visit Number 3    Authorization - Number of Visits 10    Progress Note Due on Visit 10    PT Start Time [id number]    PT Stop Time 1116    PT Time Calculation (min) 41 min    Activity Tolerance Patient tolerated treatment well              Past Medical History:  Diagnosis Date   Allergy    Anemia    prior to hysterectomy   Asthma    Back pain    comepensating for knee pain per pt   Blood transfusion without reported diagnosis    Cancer ([place])    Cataract    right eye and immature   CHF (congestive heart failure) ([place])    Complication of anesthesia    Constipation    takes Colace daily and Milk of Mag   CTS (carpal tunnel syndrome) 08/31/2016   Dizziness    but goes away real quick   Dry eyes    uses Eye Drops daily as needed   Encounter for general adult medical examination with abnormal findings 07/16/2021   Female hypogonadism syndrome    GERD (gastroesophageal reflux disease)    History of colon polyps    Hyperlipidemia    was on meds but stopped taking back in June 2015   Hypertension    takes Lisinopril daily   Hypothyroidism    takes Synthroid daily   Joint pain    Obesity (BMI 30.0-34.9)    Peripheral arterial disease (HCC)    Pneumonia    as a baby   PONV (postoperative nausea and vomiting)    Primary localized osteoarthritis of left knee 04/17/2015   Primary localized osteoarthritis of right knee    knees   Stage 3a chronic kidney disease ([place]) 10/14/2021   Urinary frequency    Urinary urgency    Past Surgical History:   Procedure Laterality Date   ABDOMINAL HYSTERECTOMY  90's   complete   APPENDECTOMY  80's   BACK SURGERY     CATARACT EXTRACTION W/PHACO Right 12/23/2015   Procedure: CATARACT EXTRACTION PHACO AND INTRAOCULAR LENS PLACEMENT RIGHT EYE CDE=6.42;  Surgeon: Williams Che, MD;  Location: [facility];  Service: Ophthalmology;  Laterality: Right;   CATARACT EXTRACTION W/PHACO Left 05/11/2022   Procedure: CATARACT EXTRACTION PHACO AND INTRAOCULAR LENS PLACEMENT (IOC);  Surgeon: Baruch Goldmann, MD;  Location: [facility];  Service: Ophthalmology;  Laterality: Left;  CDE 10.71   COLONOSCOPY  09/09/2004   [id number] rectum and colon   COLONOSCOPY N/A 11/08/2013   Procedure: COLONOSCOPY;  Surgeon: Daneil Dolin, MD;  Location: [facility];  Service: Endoscopy;  Laterality: N/A;  10:45   COLONOSCOPY WITH PROPOFOL N/A 06/30/2021   Surgeon: Daneil Dolin, MD; diverticulosis in sigmoid, descending, transverse colon, nonbleeding internal hemorrhoids, otherwise normal exam.  No repeat due to age.   JOINT REPLACEMENT     2015, 2016   [place]  10/2007   SPINE SURGERY     ruptured disc   TOTAL KNEE ARTHROPLASTY Right 04/04/2014   DR Noemi Chapel   TOTAL KNEE ARTHROPLASTY Right 04/04/2014   Procedure: RIGHT TOTAL KNEE ARTHROPLASTY;  Surgeon: Lorn Junes, MD;  Location: [facility];  Service: Orthopedics;  Laterality: Right;   TOTAL KNEE ARTHROPLASTY Left 04/29/2015   Procedure: TOTAL KNEE ARTHROPLASTY;  Surgeon: Elsie Saas, MD;  Location: [facility];  Service: Orthopedics;  Laterality: Left;   TUBAL LIGATION     Patient Active Problem List   Diagnosis Date Noted   Sciatica of left side 08/12/2022   Palpitations 07/21/2022   Gastroesophageal reflux disease without esophagitis 02/13/2022   Hemorrhoids 01/07/2022   Stage 3a chronic kidney disease ([place]) 10/14/2021   Encounter for general adult medical examination with abnormal findings 07/16/2021   Bilateral impacted cerumen 04/25/2021    Obesity (BMI 30.0-34.9)    DJD (degenerative joint disease), cervical 10/15/2016   Vitamin D deficiency 09/28/2016   Knee joint replacement status, bilateral 08/31/2016   Chronic constipation 08/31/2016   CTS (carpal tunnel syndrome) 08/31/2016   Hyperlipidemia 04/19/2015   Peripheral arterial disease ([place]) 04/19/2015   DJD (degenerative joint disease) of knee 04/04/2014   Hypertension    Hypothyroidism    Recurrent urinary tract infection    Rectal bleeding 10/31/2013   FH: colon cancer 10/31/2013    PCP: Lindell Spar, MD  REFERRING PROVIDER: Lindell Spar, MD  REFERRING DIAG: [id number] (ICD-10-CM) - Sciatica of left side  Rationale for Evaluation and Treatment: Rehabilitation  THERAPY DIAG:  Sciatica, left side  Sacroiliac joint dysfunction of left side  Other low back pain  ONSET DATE: 3 weeks ago  SUBJECTIVE:                                                                                                                                                                                           SUBJECTIVE STATEMENT: Pt reporting feel good since last session. Wants more exercises for home.   PERTINENT HISTORY:  B TKR, asthma, HTN  PAIN:  Are you having pain? Yes: NPRS scale: 4/10 Pain location: just above the L buttocks Pain description: stabbing, aching Aggravating factors: turning around, moving in the bed Relieving factors: "stretching" the trunk (arching the back)  WEIGHT BEARING RESTRICTIONS: No  FALLS:  Has patient fallen in last 6 months? No  LIVING ENVIRONMENT: Lives with: lives alone Lives in: House/apartment Stairs: Yes: External: 2 steps; can reach both Has following equipment at home: Single point cane, Quad cane small base, and Walker - 4 wheeled  OCCUPATION: retired  PLOF: Independent  PATIENT GOALS: "  for the pain to absolutely stop"   OBJECTIVE:    B LE swollen. No redness observed  DIAGNOSTIC FINDINGS:  None  performed  PATIENT SURVEYS:  FOTO 39.75  SCREENING FOR RED FLAGS: Bowel or bladder incontinence: No Spinal tumors: No COGNITION: Overall cognitive status: Within functional limits for tasks assessed     SENSATION: Not tested  MUSCLE LENGTH: Mild tightness on B hamstrings, hip flexors and piriformis   POSTURE: rounded shoulders, forward head, increased lumbar lordosis, and L LE longer in supine (did not get shorter/longer in supine-to-sit), L ASIS higher in supine and standing (potential L pelvic upslip)  PALPATION: Grade 1 tenderness on L PSIS  LUMBAR ROM:   AROM eval  Flexion 100%  Extension 50%  Right lateral flexion   Left lateral flexion   Right rotation 75%  Left rotation 75%   (Blank rows = not tested)  LOWER EXTREMITY ROM:     Active  Right eval Left eval  Hip flexion [facility]  Hip extension    Hip abduction [facility]  Hip adduction [facility]  Hip internal rotation    Hip external rotation    Knee flexion [facility]  Knee extension [facility]  Ankle dorsiflexion [facility]  Ankle plantarflexion [facility]  Ankle inversion    Ankle eversion     (Blank rows = not tested) Repeated forward flex/ext in standing did not affect the pain.  LOWER EXTREMITY MMT:    MMT Right eval Left eval  Hip flexion 4 4  Hip extension 3+ 3+  Hip abduction 4- 4-  Hip adduction    Hip internal rotation    Hip external rotation    Knee flexion 4+ 4+  Knee extension 4+ 4+  Ankle dorsiflexion 4+ 4+  Ankle plantarflexion 4+ 4+  Ankle inversion    Ankle eversion     (Blank rows = not tested)  LUMBAR SPECIAL TESTS:  H & I test: pain in multiple quadrants Forward bending test: L PSIS has more forward excursion than L (+) Thomas test B (+) Thigh thrust test L (+) Compression test L (-) Distraction test on B (-) SLR on B  (-) Slump test on B (-) Piriformis test  FUNCTIONAL TESTS:  2 minute walk test: 405 ft. . Patient reports that walking alleviates the  pain.  GAIT: Distance walked: 405 Assistive device utilized: None Level of assistance: Complete Independence Comments: slightly antalgic  TODAY'S TREATMENT:                                                                                                                              DATE:  09/01/22 Hooklying hip abduction isometric with belt at knees 10 x 10 second holds Hooklying hip adduction isometric with ball at knees  10 x 10 second holds Bridge 2 x 10  Standing hip abduction RTB at knees 2x 10  Standing hip extension RTB 2x 10  Lateral stepping RTB at  ankles 6 x 6 feet bilateral    08/26/2022 Treatment 1) Nustep 5': #5BLE and #10 BUE- cues for pacing and proper movement patterns  2)Side stepping with RTB at wall; 17f x 10. Cues for reduced hip ER and proper alignment of hip movements.   3) Backwards walking along wall with RTB 179fx 5; cues for proper movement patterns and reduced compensations.   4)Monster walks with RTB 1013f 5; cues for proper movement patterns and reduced compensations.   5)Standing hip extension with RTG around ankles; 2 x 20 repetitions; Cues for reduced trunk compensations.   6) Single arm deadlifts from elevated mat in LUE. 2 x 10 repetitions with heavy cues and demonstration for proper form.  08/19/21 Evaluation and patient education done (including self-management techniques)   PATIENT EDUCATION:  Education details: Educated on the pathoanatomy of sacroiliac joint dysfunction. Educated on the goals and course of rehab. Educated on the use of back brace only when necessary. Person educated: Patient Education method: Explanation Education comprehension: verbalized understanding  HOME EXERCISE PROGRAM: Access Code: [email or web address] URL: [email or web address] 09/01/22 - Hooklying Isometric Hip Abduction with Belt  - 1 x daily - 7 x weekly - 10 reps - 10 second hold - Supine Hip Adduction Isometric with Ball  - 1 x daily - 7 x weekly - 10  reps - 10 second hold - Bridge  - 1 x daily - 7 x weekly - 2 sets - 10 reps - Side Stepping with Resistance at Ankles  - 1 x daily - 7 x weekly - 3 sets - 10 reps  Date: 08/26/2022 - Side Stepping with Resistance at Ankles and Counter Support  - 1 x daily - 7 x weekly - 3 sets - 10 reps - Standing 3-Way Leg Reach with Resistance at Ankles and Counter Support  - 1 x daily - 7 x weekly - 3 sets - 10 reps - Standing Hip Abduction with Resistance at Ankles and Counter Support  - 1 x daily - 7 x weekly - 3 sets - 10 repsNone provide to date  ASSESSMENT:  CLINICAL IMPRESSION: Patient requires HOB slightly elevated due to c/o dizziness typically with laying flat. Began session with additional core and glute strengthening which is tolerated well. Demonstrating L glute weakness with L stance with standing hip exercise and requires intermittent cueing for avoiding compensation.  Patient will continue to benefit from physical therapy in order to improve function and reduce impairment.        OBJECTIVE IMPAIRMENTS: decreased mobility, decreased ROM, decreased strength, hypomobility, impaired flexibility, and pain.   ACTIVITY LIMITATIONS: lifting, bending, and bed mobility  PARTICIPATION LIMITATIONS: meal prep, cleaning, laundry, and community activity  PERSONAL FACTORS: Age are also affecting patient's functional outcome.   REHAB POTENTIAL: Good  CLINICAL DECISION MAKING: Stable/uncomplicated  EVALUATION COMPLEXITY: Low   GOALS: Goals reviewed with patient? Yes  SHORT TERM GOALS: Target date: 09/16/22  Ptwill demonstrate indep in HEP to facilitate carry-over of skilled services and improve functional outcomes Goal status: INITIAL  2.  Pt will report decrease in pain to 2 to facilitate ease in ADLs Baseline: 3/10 Goal status: INITIAL  3.  Pt will demonstrate improvement of lumbar ext by 25%to facilitate ease in ADLs  Baseline: 50% Goal status: INITIAL  4.  Pt will demonstrate  increase in LE strength to 4/5 to facilitate ease and safety in ambulation and ADLs Baseline: 3+/5 Goal status: INITIAL  LONG TERM GOALS: Target date: 10/14/22  Pt will increase  FOTO to at least 60 in order to demonstrate significant improvement in function related to ADLs Baseline: 39.75 Goal status: INITIAL  2.  Pt will be able to do bed mobility with little to no pain Goal status: INITIAL PLAN:  PT FREQUENCY: 2x/week  PT DURATION: 8 weeks  PLANNED INTERVENTIONS: Therapeutic exercises, Therapeutic activity, Neuromuscular re-education, Balance training, Gait training, Patient/Family education, Self Care, Joint mobilization, and Manual therapy.  PLAN FOR NEXT SESSION: May begin core and hip strengthening and improving SIJ mobility and stability.   11:19 AM, 09/01/22 Mearl Latin PT, DPT Physical Therapist at [facility]

## 2022-09-03 ENCOUNTER — Ambulatory Visit (HOSPITAL_COMMUNITY): Payer: Medicare PPO | Admitting: Physical Therapy

## 2022-09-03 DIAGNOSIS — M5432 Sciatica, left side: Secondary | ICD-10-CM

## 2022-09-03 DIAGNOSIS — M533 Sacrococcygeal disorders, not elsewhere classified: Secondary | ICD-10-CM

## 2022-09-03 DIAGNOSIS — M5459 Other low back pain: Secondary | ICD-10-CM

## 2022-09-03 NOTE — Therapy (Signed)
OUTPATIENT PHYSICAL THERAPY TREATMENT   Patient Name: [name] MRN: [id number] DOB:1941-05-12, 82 y.o., female Today's Date: 09/03/2022  END OF SESSION:  PT End of Session - 09/03/22 1102     Visit Number 4    Number of Visits 16    Date for PT Re-Evaluation 10/14/22    Authorization Type [employer] (auth required but no visit limits)    Authorization Time Period 10 visits approved 08/19/22-10/14/22    Authorization - Visit Number 4    Authorization - Number of Visits 10    Progress Note Due on Visit 10    PT Start Time 1010    PT Stop Time 1100    PT Time Calculation (min) 50 min    Activity Tolerance Patient tolerated treatment well    Behavior During Therapy WFL for tasks assessed/performed               Past Medical History:  Diagnosis Date   Allergy    Anemia    prior to hysterectomy   Asthma    Back pain    comepensating for knee pain per pt   Blood transfusion without reported diagnosis    Cancer ([place])    Cataract    right eye and immature   CHF (congestive heart failure) ([place])    Complication of anesthesia    Constipation    takes Colace daily and Milk of Mag   CTS (carpal tunnel syndrome) 08/31/2016   Dizziness    but goes away real quick   Dry eyes    uses Eye Drops daily as needed   Encounter for general adult medical examination with abnormal findings 07/16/2021   Female hypogonadism syndrome    GERD (gastroesophageal reflux disease)    History of colon polyps    Hyperlipidemia    was on meds but stopped taking back in June 2015   Hypertension    takes Lisinopril daily   Hypothyroidism    takes Synthroid daily   Joint pain    Obesity (BMI 30.0-34.9)    Peripheral arterial disease (HCC)    Pneumonia    as a baby   PONV (postoperative nausea and vomiting)    Primary localized osteoarthritis of left knee 04/17/2015   Primary localized osteoarthritis of right knee    knees   Stage 3a chronic kidney disease ([place]) 10/14/2021    Urinary frequency    Urinary urgency    Past Surgical History:  Procedure Laterality Date   ABDOMINAL HYSTERECTOMY  90's   complete   APPENDECTOMY  80's   BACK SURGERY     CATARACT EXTRACTION W/PHACO Right 12/23/2015   Procedure: CATARACT EXTRACTION PHACO AND INTRAOCULAR LENS PLACEMENT RIGHT EYE CDE=6.42;  Surgeon: Williams Che, MD;  Location: [facility];  Service: Ophthalmology;  Laterality: Right;   CATARACT EXTRACTION W/PHACO Left 05/11/2022   Procedure: CATARACT EXTRACTION PHACO AND INTRAOCULAR LENS PLACEMENT (IOC);  Surgeon: Baruch Goldmann, MD;  Location: [facility];  Service: Ophthalmology;  Laterality: Left;  CDE 10.71   COLONOSCOPY  09/09/2004   [id number] rectum and colon   COLONOSCOPY N/A 11/08/2013   Procedure: COLONOSCOPY;  Surgeon: Daneil Dolin, MD;  Location: [facility];  Service: Endoscopy;  Laterality: N/A;  10:45   COLONOSCOPY WITH PROPOFOL N/A 06/30/2021   Surgeon: Daneil Dolin, MD; diverticulosis in sigmoid, descending, transverse colon, nonbleeding internal hemorrhoids, otherwise normal exam.  No repeat due to age.   JOINT REPLACEMENT     2015, 2016  [place]   RECTOCELE REPAIR  10/2007   SPINE SURGERY     ruptured disc   TOTAL KNEE ARTHROPLASTY Right 04/04/2014   DR Noemi Chapel   TOTAL KNEE ARTHROPLASTY Right 04/04/2014   Procedure: RIGHT TOTAL KNEE ARTHROPLASTY;  Surgeon: Lorn Junes, MD;  Location: [place];  Service: Orthopedics;  Laterality: Right;   TOTAL KNEE ARTHROPLASTY Left 04/29/2015   Procedure: TOTAL KNEE ARTHROPLASTY;  Surgeon: Elsie Saas, MD;  Location: [place];  Service: Orthopedics;  Laterality: Left;   TUBAL LIGATION     Patient Active Problem List   Diagnosis Date Noted   Sciatica of left side 08/12/2022   Palpitations 07/21/2022   Gastroesophageal reflux disease without esophagitis 02/13/2022   Hemorrhoids 01/07/2022   Stage 3a chronic kidney disease ([place]) 10/14/2021   Encounter for general adult medical examination with  abnormal findings 07/16/2021   Bilateral impacted cerumen 04/25/2021   Obesity (BMI 30.0-34.9)    DJD (degenerative joint disease), cervical 10/15/2016   Vitamin D deficiency 09/28/2016   Knee joint replacement status, bilateral 08/31/2016   Chronic constipation 08/31/2016   CTS (carpal tunnel syndrome) 08/31/2016   Hyperlipidemia 04/19/2015   Peripheral arterial disease ([place]) 04/19/2015   DJD (degenerative joint disease) of knee 04/04/2014   Hypertension    Hypothyroidism    Recurrent urinary tract infection    Rectal bleeding 10/31/2013   FH: colon cancer 10/31/2013    PCP: Lindell Spar, MD  REFERRING PROVIDER: Lindell Spar, MD  REFERRING DIAG: [phone] (ICD-10-CM) - Sciatica of left side  Rationale for Evaluation and Treatment: Rehabilitation  THERAPY DIAG:  Sciatica, left side  Sacroiliac joint dysfunction of left side  Other low back pain  ONSET DATE: 3 weeks ago  SUBJECTIVE:                                                                                                                                                                                           SUBJECTIVE STATEMENT: [name] states that she feels like her pain is returning.  PT states that her Lt side is bothering her  PERTINENT HISTORY:  B TKR, asthma, HTN  PAIN:  Are you having pain? Yes: NPRS scale: 4/10 Pain location: just above the L buttocks Pain description: stabbing, aching Aggravating factors: turning around, moving in the bed Relieving factors: "stretching" the trunk (arching the back)   PATIENT GOALS: "for the pain to absolutely stop"   OBJECTIVE:    B LE swollen. No redness observed  PATIENT SURVEYS:  FOTO 39.75   MUSCLE LENGTH: Mild tightness on B hamstrings, hip flexors and piriformis  POSTURE: rounded shoulders, forward head, increased lumbar lordosis, and L LE longer in supine (did not get shorter/longer in supine-to-sit), L ASIS higher in supine and standing  (potential L pelvic upslip)  PALPATION: Grade 1 tenderness on L PSIS  LUMBAR ROM:   AROM eval  Flexion 100%  Extension 50%  Right lateral flexion   Left lateral flexion   Right rotation 75%  Left rotation 75%   (Blank rows = not tested)  LOWER EXTREMITY ROM:     Active  Right eval Left eval  Hip flexion [facility]  Hip extension    Hip abduction [facility]  Hip adduction [facility]  Hip internal rotation    Hip external rotation    Knee flexion [facility]  Knee extension [facility]  Ankle dorsiflexion [facility]  Ankle plantarflexion [facility] WFL  Ankle inversion    Ankle eversion     (Blank rows = not tested) Repeated forward flex/ext in standing did not affect the pain.  LOWER EXTREMITY MMT:    MMT Right eval Left eval  Hip flexion 4 4  Hip extension 3+ 3+  Hip abduction 4- 4-  Hip adduction    Hip internal rotation    Hip external rotation    Knee flexion 4+ 4+  Knee extension 4+ 4+  Ankle dorsiflexion 4+ 4+  Ankle plantarflexion 4+ 4+  Ankle inversion    Ankle eversion     (Blank rows = not tested)  LUMBAR SPECIAL TESTS:  H & I test: pain in multiple quadrants Forward bending test: L PSIS has more forward excursion than L (+) Thomas test B (+) Thigh thrust test L (+) Compression test L (-) Distraction test on B (-) SLR on B  (-) Slump test on B (-) Piriformis test  FUNCTIONAL TESTS:  2 minute walk test: 405 ft. . Patient reports that walking alleviates the pain.  GAIT: Distance walked: 405 Assistive device utilized: None Level of assistance: Complete Independence Comments: slightly antalgic  TODAY'S TREATMENT:                                                                                                                              DATE:  09/03/2022: SI checked in standing position : Rt iliac crest higher PSIS higher ASIS lower  Supine sit test:  supine Lt is shorter. Clearing of pubic area with hip isometric adduction/abduction and bridging followed by  contract relax to properly align SI  Manual muscle techniques used to anteriorly rotate Lt SI with success after several attempts. Supine:  Lt LE only bridge x 5 RT single knee to chest only x 3 for 30" Clam X 5 B      09/01/22 Hooklying hip abduction isometric with belt at knees 10 x 10 second holds Hooklying hip adduction isometric with ball at knees  10 x 10 second holds Bridge 2 x 10  Standing hip abduction RTB at knees 2x 10  Standing  hip extension RTB 2x 10  Lateral stepping RTB at ankles 6 x 6 feet bilateral    08/26/2022 Treatment 1) Nustep 5': #5BLE and #10 BUE- cues for pacing and proper movement patterns  2)Side stepping with RTB at wall; 59f x 10. Cues for reduced hip ER and proper alignment of hip movements.   3) Backwards walking along wall with RTB 18fx 5; cues for proper movement patterns and reduced compensations.   4)Monster walks with RTB 1020f 5; cues for proper movement patterns and reduced compensations.   5)Standing hip extension with RTG around ankles; 2 x 20 repetitions; Cues for reduced trunk compensations.   6) Single arm deadlifts from elevated mat in LUE. 2 x 10 repetitions with heavy cues and demonstration for proper form.  08/19/21 Evaluation and patient education done (including self-management techniques)   PATIENT EDUCATION:  Education details: Educated on the pathoanatomy of sacroiliac joint dysfunction. Educated on the goals and course of rehab. Educated on the use of back brace only when necessary. Person educated: Patient Education method: Explanation Education comprehension: verbalized understanding  HOME EXERCISE PROGRAM: Access Code: [email or web address] 2/15: Lt leg bridge only RT knee to chest  URL: [email or web address] 09/01/22 - Hooklying Isometric Hip Abduction with Belt  - 1 x daily - 7 x weekly - 10 reps - 10 second hold - Supine Hip Adduction Isometric with Ball  - 1 x daily - 7 x weekly - 10 reps - 10 second hold -  Bridge  - 1 x daily - 7 x weekly - 2 sets - 10 reps - Side Stepping with Resistance at Ankles  - 1 x daily - 7 x weekly - 3 sets - 10 reps   Date: 08/26/2022 - Side Stepping with Resistance at Ankles and Counter Support  - 1 x daily - 7 x weekly - 3 sets - 10 reps - Standing 3-Way Leg Reach with Resistance at Ankles and Counter Support  - 1 x daily - 7 x weekly - 3 sets - 10 reps - Standing Hip Abduction with Resistance at Ankles and Counter Support  - 1 x daily - 7 x weekly - 3 sets - 10 repsNone provide to date  ASSESSMENT:  CLINICAL IMPRESSION: Pt with noted LT SI dysfunction.  Manual contract and relax to adjust dysfunction which was successful but did not totally improve dysfunction.  PT given Lt leg bridging and Rt knee to chest for HEP in order to improve SI dysfunction.  Pt will continue to need SI monitoring as well as LE strengthening exercises.   Patient will continue to benefit from physical therapy in order to improve function and reduce impairment.        OBJECTIVE IMPAIRMENTS: decreased mobility, decreased ROM, decreased strength, hypomobility, impaired flexibility, and pain.   ACTIVITY LIMITATIONS: lifting, bending, and bed mobility  PARTICIPATION LIMITATIONS: meal prep, cleaning, laundry, and community activity  PERSONAL FACTORS: Age are also affecting patient's functional outcome.   REHAB POTENTIAL: Good  CLINICAL DECISION MAKING: Stable/uncomplicated  EVALUATION COMPLEXITY: Low   GOALS: Goals reviewed with patient? Yes  SHORT TERM GOALS: Target date: 09/16/22  Ptwill demonstrate indep in HEP to facilitate carry-over of skilled services and improve functional outcomes Goal status: IN PROGRESS  2.  Pt will report decrease in pain to 2 to facilitate ease in ADLs Baseline: 3/10 Goal status: IN PROGRESS  3.  Pt will demonstrate improvement of lumbar ext by 25%to facilitate ease in ADLs  Baseline: 50% Goal status: IN PROGRESS  4.  Pt will demonstrate  increase in LE strength to 4/5 to facilitate ease and safety in ambulation and ADLs Baseline: 3+/5 Goal status: IN PROGRESS  LONG TERM GOALS: Target date: 10/14/22  Pt will increase FOTO to at least 60 in order to demonstrate significant improvement in function related to ADLs Baseline: 39.75 Goal status: IN PROGRESS  2.  Pt will be able to do bed mobility with little to no pain Goal status: IN PROGRESS PLAN:  PT FREQUENCY: 2x/week  PT DURATION: 8 weeks  PLANNED INTERVENTIONS: Therapeutic exercises, Therapeutic activity, Neuromuscular re-education, Balance training, Gait training, Patient/Family education, Self Care, Joint mobilization, and Manual therapy.  PLAN FOR NEXT SESSION:  Check SI; complete adjustment if needed continue with  core and hip strengthening  and stability.  Rayetta Humphrey PT CLT

## 2022-09-07 ENCOUNTER — Ambulatory Visit (HOSPITAL_COMMUNITY): Payer: Medicare PPO | Admitting: Physical Therapy

## 2022-09-07 ENCOUNTER — Encounter (HOSPITAL_COMMUNITY): Payer: Self-pay | Admitting: Physical Therapy

## 2022-09-07 DIAGNOSIS — M5459 Other low back pain: Secondary | ICD-10-CM | POA: Diagnosis not present

## 2022-09-07 DIAGNOSIS — M533 Sacrococcygeal disorders, not elsewhere classified: Secondary | ICD-10-CM

## 2022-09-07 DIAGNOSIS — M5432 Sciatica, left side: Secondary | ICD-10-CM

## 2022-09-07 NOTE — Therapy (Signed)
OUTPATIENT PHYSICAL THERAPY TREATMENT   Patient Name: [name] MRN: [id number] DOB:[birth year], 82 y.o., female Today's Date: 09/07/2022  END OF SESSION:  PT End of Session - 09/07/22 1027     Visit Number 5    Number of Visits 16    Date for PT Re-Evaluation 10/14/22    Authorization Type [employer] (auth required but no visit limits)    Authorization Time Period 10 visits approved 08/19/22-10/14/22    Authorization - Visit Number 5    Authorization - Number of Visits 10    Progress Note Due on Visit 10    PT Start Time 1030    PT Stop Time 1110    PT Time Calculation (min) 40 min    Activity Tolerance Patient tolerated treatment well    Behavior During Therapy WFL for tasks assessed/performed               Past Medical History:  Diagnosis Date   Allergy    Anemia    prior to hysterectomy   Asthma    Back pain    comepensating for knee pain per pt   Blood transfusion without reported diagnosis    Cancer ([place])    Cataract    right eye and immature   CHF (congestive heart failure) ([place])    Complication of anesthesia    Constipation    takes Colace daily and Milk of Mag   CTS (carpal tunnel syndrome) 08/31/2016   Dizziness    but goes away real quick   Dry eyes    uses Eye Drops daily as needed   Encounter for general adult medical examination with abnormal findings 07/16/2021   Female hypogonadism syndrome    GERD (gastroesophageal reflux disease)    History of colon polyps    Hyperlipidemia    was on meds but stopped taking back in June 2015   Hypertension    takes Lisinopril daily   Hypothyroidism    takes Synthroid daily   Joint pain    Obesity (BMI 30.0-34.9)    Peripheral arterial disease (HCC)    Pneumonia    as a baby   PONV (postoperative nausea and vomiting)    Primary localized osteoarthritis of left knee 04/17/2015   Primary localized osteoarthritis of right knee    knees   Stage 3a chronic kidney disease ([place]) 10/14/2021    Urinary frequency    Urinary urgency    Past Surgical History:  Procedure Laterality Date   ABDOMINAL HYSTERECTOMY  90's   complete   APPENDECTOMY  80's   BACK SURGERY     CATARACT EXTRACTION W/PHACO Right 12/23/2015   Procedure: CATARACT EXTRACTION PHACO AND INTRAOCULAR LENS PLACEMENT RIGHT EYE CDE=6.42;  Surgeon: Williams Che, MD;  Location: [facility];  Service: Ophthalmology;  Laterality: Right;   CATARACT EXTRACTION W/PHACO Left 05/11/2022   Procedure: CATARACT EXTRACTION PHACO AND INTRAOCULAR LENS PLACEMENT (IOC);  Surgeon: Baruch Goldmann, MD;  Location: [facility];  Service: Ophthalmology;  Laterality: Left;  CDE 10.71   COLONOSCOPY  09/09/2004   [id number] rectum and colon   COLONOSCOPY N/A 11/08/2013   Procedure: COLONOSCOPY;  Surgeon: Daneil Dolin, MD;  Location: [facility];  Service: Endoscopy;  Laterality: N/A;  10:45   COLONOSCOPY WITH PROPOFOL N/A 06/30/2021   Surgeon: Daneil Dolin, MD; diverticulosis in sigmoid, descending, transverse colon, nonbleeding internal hemorrhoids, otherwise normal exam.  No repeat due to age.   JOINT REPLACEMENT     2015, 2016  [place]   RECTOCELE REPAIR  10/2007   SPINE SURGERY     ruptured disc   TOTAL KNEE ARTHROPLASTY Right 04/04/2014   DR Noemi Chapel   TOTAL KNEE ARTHROPLASTY Right 04/04/2014   Procedure: RIGHT TOTAL KNEE ARTHROPLASTY;  Surgeon: Lorn Junes, MD;  Location: [place];  Service: Orthopedics;  Laterality: Right;   TOTAL KNEE ARTHROPLASTY Left 04/29/2015   Procedure: TOTAL KNEE ARTHROPLASTY;  Surgeon: Elsie Saas, MD;  Location: [place];  Service: Orthopedics;  Laterality: Left;   TUBAL LIGATION     Patient Active Problem List   Diagnosis Date Noted   Sciatica of left side 08/12/2022   Palpitations 07/21/2022   Gastroesophageal reflux disease without esophagitis 02/13/2022   Hemorrhoids 01/07/2022   Stage 3a chronic kidney disease ([place]) 10/14/2021   Encounter for general adult medical examination with  abnormal findings 07/16/2021   Bilateral impacted cerumen 04/25/2021   Obesity (BMI 30.0-34.9)    DJD (degenerative joint disease), cervical 10/15/2016   Vitamin D deficiency 09/28/2016   Knee joint replacement status, bilateral 08/31/2016   Chronic constipation 08/31/2016   CTS (carpal tunnel syndrome) 08/31/2016   Hyperlipidemia 04/19/2015   Peripheral arterial disease ([place]) 04/19/2015   DJD (degenerative joint disease) of knee 04/04/2014   Hypertension    Hypothyroidism    Recurrent urinary tract infection    Rectal bleeding 10/31/2013   FH: colon cancer 10/31/2013    PCP: Lindell Spar, MD  REFERRING PROVIDER: Lindell Spar, MD  REFERRING DIAG: [phone] (ICD-10-CM) - Sciatica of left side  Rationale for Evaluation and Treatment: Rehabilitation  THERAPY DIAG:  Sciatica, left side  Sacroiliac joint dysfunction of left side  Other low back pain  ONSET DATE: 3 weeks ago  SUBJECTIVE:                                                                                                                                                                                           SUBJECTIVE STATEMENT: Patient states feeling tired today. Hasn't had stabbing pain. Some soreness last night. Hasn't been catching her.   PERTINENT HISTORY:  B TKR, asthma, HTN  PAIN:  Are you having pain? Yes: NPRS scale: 4/10 Pain location: just above the L buttocks Pain description: stabbing, aching Aggravating factors: turning around, moving in the bed Relieving factors: "stretching" the trunk (arching the back)   PATIENT GOALS: "for the pain to absolutely stop"   OBJECTIVE:    B LE swollen. No redness observed  PATIENT SURVEYS:  FOTO 39.75   MUSCLE LENGTH: Mild tightness on B hamstrings, hip flexors and piriformis   POSTURE: rounded shoulders,  forward head, increased lumbar lordosis, and L LE longer in supine (did not get shorter/longer in supine-to-sit), L ASIS higher in supine and  standing (potential L pelvic upslip)  PALPATION: Grade 1 tenderness on L PSIS  LUMBAR ROM:   AROM eval  Flexion 100%  Extension 50%  Right lateral flexion   Left lateral flexion   Right rotation 75%  Left rotation 75%   (Blank rows = not tested)  LOWER EXTREMITY ROM:     Active  Right eval Left eval  Hip flexion [facility]  Hip extension    Hip abduction [facility]  Hip adduction [facility]  Hip internal rotation    Hip external rotation    Knee flexion [facility]  Knee extension [facility]  Ankle dorsiflexion [facility]  Ankle plantarflexion [facility] WFL  Ankle inversion    Ankle eversion     (Blank rows = not tested) Repeated forward flex/ext in standing did not affect the pain.  LOWER EXTREMITY MMT:    MMT Right eval Left eval  Hip flexion 4 4  Hip extension 3+ 3+  Hip abduction 4- 4-  Hip adduction    Hip internal rotation    Hip external rotation    Knee flexion 4+ 4+  Knee extension 4+ 4+  Ankle dorsiflexion 4+ 4+  Ankle plantarflexion 4+ 4+  Ankle inversion    Ankle eversion     (Blank rows = not tested)  LUMBAR SPECIAL TESTS:  H & I test: pain in multiple quadrants Forward bending test: L PSIS has more forward excursion than L (+) Thomas test B (+) Thigh thrust test L (+) Compression test L (-) Distraction test on B (-) SLR on B  (-) Slump test on B (-) Piriformis test  FUNCTIONAL TESTS:  2 minute walk test: 405 ft. . Patient reports that walking alleviates the pain.  GAIT: Distance walked: 405 Assistive device utilized: None Level of assistance: Complete Independence Comments: slightly antalgic  TODAY'S TREATMENT:                                                                                                                              DATE:  09/07/22 Lt LE bridge 2 x 8  RT single knee to chest only x 3 for 30" MET R hip flexion, left hip extension in supine STS with RTB at knees 2 x 10  Sidelying clam 1 x 15 LLE Step up 7 inch 2 x 10 bilateral     09/03/2022: SI checked in standing position : Rt iliac crest higher PSIS higher ASIS lower  Supine sit test:  supine Lt is shorter. Clearing of pubic area with hip isometric adduction/abduction and bridging followed by contract relax to properly align SI  Manual muscle techniques used to anteriorly rotate Lt SI with success after several attempts. Supine:  Lt LE only bridge x 5 RT single knee to chest only x 3 for 30" Clam X  5 B      09/01/22 Hooklying hip abduction isometric with belt at knees 10 x 10 second holds Hooklying hip adduction isometric with ball at knees  10 x 10 second holds Bridge 2 x 10  Standing hip abduction RTB at knees 2x 10  Standing hip extension RTB 2x 10  Lateral stepping RTB at ankles 6 x 6 feet bilateral    08/26/2022 Treatment 1) Nustep 5': #5BLE and #10 BUE- cues for pacing and proper movement patterns  2)Side stepping with RTB at wall; 37f x 10. Cues for reduced hip ER and proper alignment of hip movements.   3) Backwards walking along wall with RTB 168fx 5; cues for proper movement patterns and reduced compensations.   4)Monster walks with RTB 1044f 5; cues for proper movement patterns and reduced compensations.   5)Standing hip extension with RTG around ankles; 2 x 20 repetitions; Cues for reduced trunk compensations.   6) Single arm deadlifts from elevated mat in LUE. 2 x 10 repetitions with heavy cues and demonstration for proper form.  08/19/21 Evaluation and patient education done (including self-management techniques)   PATIENT EDUCATION:  Education details: Educated on the pathoanatomy of sacroiliac joint dysfunction. Educated on the goals and course of rehab. Educated on the use of back brace only when necessary. Person educated: Patient Education method: Explanation Education comprehension: verbalized understanding  HOME EXERCISE PROGRAM: Access Code: [email or web address] 09/07/22 - Sit to Stand with Resistance Around Legs  - 1 x daily -  7 x weekly - 2 sets - 10 reps  2/15: Lt leg bridge only RT knee to chest  URL: [email or web address] 09/01/22 - Hooklying Isometric Hip Abduction with Belt  - 1 x daily - 7 x weekly - 10 reps - 10 second hold - Supine Hip Adduction Isometric with Ball  - 1 x daily - 7 x weekly - 10 reps - 10 second hold - Bridge  - 1 x daily - 7 x weekly - 2 sets - 10 reps - Side Stepping with Resistance at Ankles  - 1 x daily - 7 x weekly - 3 sets - 10 reps   Date: 08/26/2022 - Side Stepping with Resistance at Ankles and Counter Support  - 1 x daily - 7 x weekly - 3 sets - 10 reps - Standing 3-Way Leg Reach with Resistance at Ankles and Counter Support  - 1 x daily - 7 x weekly - 3 sets - 10 reps - Standing Hip Abduction with Resistance at Ankles and Counter Support  - 1 x daily - 7 x weekly - 3 sets - 10 repsNone provide to date  ASSESSMENT:  CLINICAL IMPRESSION: Continued with alignment exercises previously completed as they seem to be helpful. Performed MET in supine which is tolerated well with improvement in symptoms. Began functional strengthening with STS and patient demonstrates dynamic knee valgus which improves with loop resistance band at knees. Patient will continue to benefit from physical therapy in order to improve function and reduce impairment.        OBJECTIVE IMPAIRMENTS: decreased mobility, decreased ROM, decreased strength, hypomobility, impaired flexibility, and pain.   ACTIVITY LIMITATIONS: lifting, bending, and bed mobility  PARTICIPATION LIMITATIONS: meal prep, cleaning, laundry, and community activity  PERSONAL FACTORS: Age are also affecting patient's functional outcome.   REHAB POTENTIAL: Good  CLINICAL DECISION MAKING: Stable/uncomplicated  EVALUATION COMPLEXITY: Low   GOALS: Goals reviewed with patient? Yes  SHORT TERM GOALS: Target date: 09/16/22  Ptwill demonstrate indep in  HEP to facilitate carry-over of skilled services and improve functional  outcomes Goal status: IN PROGRESS  2.  Pt will report decrease in pain to 2 to facilitate ease in ADLs Baseline: 3/10 Goal status: IN PROGRESS  3.  Pt will demonstrate improvement of lumbar ext by 25%to facilitate ease in ADLs  Baseline: 50% Goal status: IN PROGRESS  4.  Pt will demonstrate increase in LE strength to 4/5 to facilitate ease and safety in ambulation and ADLs Baseline: 3+/5 Goal status: IN PROGRESS  LONG TERM GOALS: Target date: 10/14/22  Pt will increase FOTO to at least 60 in order to demonstrate significant improvement in function related to ADLs Baseline: 39.75 Goal status: IN PROGRESS  2.  Pt will be able to do bed mobility with little to no pain Goal status: IN PROGRESS PLAN:  PT FREQUENCY: 2x/week  PT DURATION: 8 weeks  PLANNED INTERVENTIONS: Therapeutic exercises, Therapeutic activity, Neuromuscular re-education, Balance training, Gait training, Patient/Family education, Self Care, Joint mobilization, and Manual therapy.  PLAN FOR NEXT SESSION:  Check SI; complete adjustment if needed continue with  core and hip strengthening  and stability.    10:28 AM, 09/07/22 Mearl Latin PT, DPT Physical Therapist at [facility]

## 2022-09-09 ENCOUNTER — Ambulatory Visit (HOSPITAL_COMMUNITY): Payer: Medicare PPO

## 2022-09-09 DIAGNOSIS — M533 Sacrococcygeal disorders, not elsewhere classified: Secondary | ICD-10-CM | POA: Diagnosis not present

## 2022-09-09 DIAGNOSIS — M5432 Sciatica, left side: Secondary | ICD-10-CM

## 2022-09-09 DIAGNOSIS — M5459 Other low back pain: Secondary | ICD-10-CM

## 2022-09-09 NOTE — Therapy (Signed)
OUTPATIENT PHYSICAL THERAPY TREATMENT   Patient Name: [name] MRN: [id number] DOB:1941/02/16, 82 y.o., female Today's Date: 09/09/2022  END OF SESSION:  PT End of Session - 09/09/22 1153     Visit Number 6    Number of Visits 16    Date for PT Re-Evaluation 10/14/22    Authorization Type [employer] (auth required but no visit limits)    Authorization Time Period 10 visits approved 08/19/22-10/14/22    Authorization - Visit Number 6    Authorization - Number of Visits 10    Progress Note Due on Visit 10    PT Start Time 1030    PT Stop Time 1115    PT Time Calculation (min) 45 min    Activity Tolerance Patient tolerated treatment well            Past Medical History:  Diagnosis Date   Allergy    Anemia    prior to hysterectomy   Asthma    Back pain    comepensating for knee pain per pt   Blood transfusion without reported diagnosis    Cancer ([place])    Cataract    right eye and immature   CHF (congestive heart failure) ([place])    Complication of anesthesia    Constipation    takes Colace daily and Milk of Mag   CTS (carpal tunnel syndrome) 08/31/2016   Dizziness    but goes away real quick   Dry eyes    uses Eye Drops daily as needed   Encounter for general adult medical examination with abnormal findings 07/16/2021   Female hypogonadism syndrome    GERD (gastroesophageal reflux disease)    History of colon polyps    Hyperlipidemia    was on meds but stopped taking back in June 2015   Hypertension    takes Lisinopril daily   Hypothyroidism    takes Synthroid daily   Joint pain    Obesity (BMI 30.0-34.9)    Peripheral arterial disease (HCC)    Pneumonia    as a baby   PONV (postoperative nausea and vomiting)    Primary localized osteoarthritis of left knee 04/17/2015   Primary localized osteoarthritis of right knee    knees   Stage 3a chronic kidney disease ([place]) 10/14/2021   Urinary frequency    Urinary urgency    Past Surgical History:   Procedure Laterality Date   ABDOMINAL HYSTERECTOMY  90's   complete   APPENDECTOMY  80's   BACK SURGERY     CATARACT EXTRACTION W/PHACO Right 12/23/2015   Procedure: CATARACT EXTRACTION PHACO AND INTRAOCULAR LENS PLACEMENT RIGHT EYE CDE=6.42;  Surgeon: Williams Che, MD;  Location: [facility];  Service: Ophthalmology;  Laterality: Right;   CATARACT EXTRACTION W/PHACO Left 05/11/2022   Procedure: CATARACT EXTRACTION PHACO AND INTRAOCULAR LENS PLACEMENT (IOC);  Surgeon: Baruch Goldmann, MD;  Location: [facility];  Service: Ophthalmology;  Laterality: Left;  CDE 10.71   COLONOSCOPY  09/09/2004   [id number] rectum and colon   COLONOSCOPY N/A 11/08/2013   Procedure: COLONOSCOPY;  Surgeon: Daneil Dolin, MD;  Location: [facility];  Service: Endoscopy;  Laterality: N/A;  10:45   COLONOSCOPY WITH PROPOFOL N/A 06/30/2021   Surgeon: Daneil Dolin, MD; diverticulosis in sigmoid, descending, transverse colon, nonbleeding internal hemorrhoids, otherwise normal exam.  No repeat due to age.   JOINT REPLACEMENT     2015, 2016   [place] REPAIR  10/2007  SPINE SURGERY     ruptured disc   TOTAL KNEE ARTHROPLASTY Right 04/04/2014   DR Noemi Chapel   TOTAL KNEE ARTHROPLASTY Right 04/04/2014   Procedure: RIGHT TOTAL KNEE ARTHROPLASTY;  Surgeon: Lorn Junes, MD;  Location: [facility];  Service: Orthopedics;  Laterality: Right;   TOTAL KNEE ARTHROPLASTY Left 04/29/2015   Procedure: TOTAL KNEE ARTHROPLASTY;  Surgeon: Elsie Saas, MD;  Location: [facility];  Service: Orthopedics;  Laterality: Left;   TUBAL LIGATION     Patient Active Problem List   Diagnosis Date Noted   Sciatica of left side 08/12/2022   Palpitations 07/21/2022   Gastroesophageal reflux disease without esophagitis 02/13/2022   Hemorrhoids 01/07/2022   Stage 3a chronic kidney disease ([place]) 10/14/2021   Encounter for general adult medical examination with abnormal findings 07/16/2021   Bilateral impacted cerumen 04/25/2021    Obesity (BMI 30.0-34.9)    DJD (degenerative joint disease), cervical 10/15/2016   Vitamin D deficiency 09/28/2016   Knee joint replacement status, bilateral 08/31/2016   Chronic constipation 08/31/2016   CTS (carpal tunnel syndrome) 08/31/2016   Hyperlipidemia 04/19/2015   Peripheral arterial disease ([place]) 04/19/2015   DJD (degenerative joint disease) of knee 04/04/2014   Hypertension    Hypothyroidism    Recurrent urinary tract infection    Rectal bleeding 10/31/2013   FH: colon cancer 10/31/2013    PCP: Lindell Spar, MD  REFERRING PROVIDER: Lindell Spar, MD  REFERRING DIAG: [phone] (ICD-10-CM) - Sciatica of left side  Rationale for Evaluation and Treatment: Rehabilitation  THERAPY DIAG:  Sciatica, left side  Sacroiliac joint dysfunction of left side  Other low back pain  ONSET DATE: 3 weeks ago  SUBJECTIVE:                                                                                                                                                                                           SUBJECTIVE STATEMENT: Patient states that she's only having some discomfort on the L side but she's not pain anymore. Reported that she feels some "pulling" at the back of her L thigh after she completes her PT session.  PERTINENT HISTORY:  B TKR, asthma, HTN  PAIN:  Are you having pain? Yes: NPRS scale: 3/10 Pain location: just above the L buttocks Pain description: stabbing, aching Aggravating factors: turning around, moving in the bed Relieving factors: "stretching" the trunk (arching the back)   PATIENT GOALS: "for the pain to absolutely stop"   OBJECTIVE:    B LE swollen. No redness observed  PATIENT SURVEYS:  FOTO 39.75   MUSCLE LENGTH: Mild tightness on B hamstrings, hip flexors and  piriformis   POSTURE: rounded shoulders, forward head, increased lumbar lordosis, and L LE longer in supine (did not get shorter/longer in supine-to-sit), L ASIS higher  in supine and standing (potential L pelvic upslip)  PALPATION: Grade 1 tenderness on L PSIS  LUMBAR ROM:   AROM eval  Flexion 100%  Extension 50%  Right lateral flexion   Left lateral flexion   Right rotation 75%  Left rotation 75%   (Blank rows = not tested)  LOWER EXTREMITY ROM:     Active  Right eval Left eval  Hip flexion [facility]  Hip extension    Hip abduction [facility]  Hip adduction [facility]  Hip internal rotation    Hip external rotation    Knee flexion [facility]  Knee extension [facility]  Ankle dorsiflexion [facility]  Ankle plantarflexion [facility] WFL  Ankle inversion    Ankle eversion     (Blank rows = not tested) Repeated forward flex/ext in standing did not affect the pain.  LOWER EXTREMITY MMT:    MMT Right eval Left eval  Hip flexion 4 4  Hip extension 3+ 3+  Hip abduction 4- 4-  Hip adduction    Hip internal rotation    Hip external rotation    Knee flexion 4+ 4+  Knee extension 4+ 4+  Ankle dorsiflexion 4+ 4+  Ankle plantarflexion 4+ 4+  Ankle inversion    Ankle eversion     (Blank rows = not tested)  LUMBAR SPECIAL TESTS:  H & I test: pain in multiple quadrants Forward bending test: L PSIS has more forward excursion than L (+) Thomas test B (+) Thigh thrust test L (+) Compression test L (-) Distraction test on B (-) SLR on B  (-) Slump test on B (-) Piriformis test  FUNCTIONAL TESTS:  2 minute walk test: 405 ft. . Patient reports that walking alleviates the pain.  GAIT: Distance walked: 405 Assistive device utilized: None Level of assistance: Complete Independence Comments: slightly antalgic  TODAY'S TREATMENT:                                                                                                                              DATE:  09/09/22 Grade 2 inf L hip distraction (anchored on the middle thigh) x 30" x 3 followed by Grade 1 inf thrust on the last rep Hooklying hip adduction squeeze x 3" x 10 x 2 DL bridging with hip abd,  RTB x 3" x 10 x 2 Sidelying clamshells x RTB x 10 x 2 Supine figure-of-4 piriformis stretch x 30" x 3 Seated hamstring stretch x 30" x 3 Seated trunk flex 3-way stretch with a physioball x 30" each direction Standing abdominal isometrics with a physioball x 3" x 10 x 2  09/07/22 Lt LE bridge 2 x 8  RT single knee to chest only x 3 for 30" MET R hip flexion, left hip extension in supine STS  with RTB at knees 2 x 10  Sidelying clam 1 x 15 LLE Step up 7 inch 2 x 10 bilateral   09/03/2022: SI checked in standing position : Rt iliac crest higher PSIS higher ASIS lower  Supine sit test:  supine Lt is shorter. Clearing of pubic area with hip isometric adduction/abduction and bridging followed by contract relax to properly align SI  Manual muscle techniques used to anteriorly rotate Lt SI with success after several attempts. Supine:  Lt LE only bridge x 5 RT single knee to chest only x 3 for 30" Clam X 5 B   09/01/22 Hooklying hip abduction isometric with belt at knees 10 x 10 second holds Hooklying hip adduction isometric with ball at knees  10 x 10 second holds Bridge 2 x 10  Standing hip abduction RTB at knees 2x 10  Standing hip extension RTB 2x 10  Lateral stepping RTB at ankles 6 x 6 feet bilateral   08/26/2022 Treatment 1) Nustep 5': #5BLE and #10 BUE- cues for pacing and proper movement patterns  2)Side stepping with RTB at wall; 48f x 10. Cues for reduced hip ER and proper alignment of hip movements.   3) Backwards walking along wall with RTB 122fx 5; cues for proper movement patterns and reduced compensations.   4)Monster walks with RTB 1038f 5; cues for proper movement patterns and reduced compensations.   5)Standing hip extension with RTG around ankles; 2 x 20 repetitions; Cues for reduced trunk compensations.   6) Single arm deadlifts from elevated mat in LUE. 2 x 10 repetitions with heavy cues and demonstration for proper form.  08/19/21 Evaluation and patient  education done (including self-management techniques)   PATIENT EDUCATION:  Education details: updated HEP Person educated: Patient Education method: Explanation, Demonstration, and Handouts Education comprehension: verbalized understanding and returned demonstration  HOME EXERCISE PROGRAM: Access Code: [email or web address] - Seated Hamstring Stretch  - 1 x daily - 7 x weekly - 3 reps - 30 hold - Supine Piriformis Stretch with Foot on Ground  - 1 x daily - 7 x weekly - 3 reps - 30 hold  09/07/22 - Sit to Stand with Resistance Around Legs  - 1 x daily - 7 x weekly - 2 sets - 10 reps  2/15: Lt leg bridge only RT knee to chest  URL: [email or web address] 09/[email or web address]/22 - Hooklying Isometric Hip Abduction with Belt  - 1 x daily - 7 x weekly - 10 reps - 10 second hold - Supine Hip Adduction Isometric with Ball  - 1 x daily - 7 x weekly - 10 reps - 10 second hold - Bridge  - 1 x daily - 7 x weekly - 2 sets - 10 reps - Side Stepping with Resistance at Ankles  - 1 x daily - 7 x weekly - 3 sets - 10 reps   Date: 08/26/2022 - Side Stepping with Resistance at Ankles and Counter Support  - 1 x daily - 7 x weekly - 3 sets - 10 reps - Standing 3-Way Leg Reach with Resistance at Ankles and Counter Support  - 1 x daily - 7 x weekly - 3 sets - 10 reps - Standing Hip Abduction with Resistance at Ankles and Counter Support  - 1 x daily - 7 x weekly - 3 sets - 10 repsNone provide to date  ASSESSMENT:  CLINICAL IMPRESSION: Examined pelvic alignment today. B medial malleoli are still not level in supine (L LE shorter). No changes seen  in supine-to-sit test. L ASIS and L PSIS are still higher in standing. After performing hip distraction, patient's LE are slightly level although the L is still slightly shorter than the R. Interventions today were geared towards pelvic alignment, core and hip strengthening, and LE flexibility. Tolerated all activities without worsening of symptoms. Had mild difficulty with  the bridges due to weakness of hip extensors. Demonstrated appropriate levels of fatigue. Required mild amount of verbal cueing to ensure correct execution of activity. Reported total elimination of discomfort at the end of the session. To date, skilled PT is required to address the impairments and improve function.   Patient is a 82 y.o. female who was seen today for physical therapy evaluation and treatment for sciatica on L side. Patient was diagnosed with L side sciatica by referring provider further defined by difficulty with bed mobility due to pain, weakness, joint hypomobility, and decreased soft tissue extensibility. However, patient doesn't exhibit signs of neural tension as patient is negative for SLR and Slump test. Patient maybe potentially exhibiting signs of sacroiliac joint dysfunction (hypomobility) on the L side as patient is positive on the forward bending test, thigh thrust test, and compression test. In addition, patient may present a L pelvic upslip as the L ASIS and PSIS are higher in supine and standing respectively. Skilled PT is required to address the impairments and functional limitations listed below. Patient is concerned about her legs being swollen. PT messaged Dr. Posey Pronto via Epic today about patient's concerns. Dr. Posey Pronto responded saying that the swelling is from the prednisone that was given.       OBJECTIVE IMPAIRMENTS: decreased mobility, decreased ROM, decreased strength, hypomobility, impaired flexibility, and pain.   ACTIVITY LIMITATIONS: lifting, bending, and bed mobility  PARTICIPATION LIMITATIONS: meal prep, cleaning, laundry, and community activity  PERSONAL FACTORS: Age are also affecting patient's functional outcome.   REHAB POTENTIAL: Good  CLINICAL DECISION MAKING: Stable/uncomplicated  EVALUATION COMPLEXITY: Low   GOALS: Goals reviewed with patient? Yes  SHORT TERM GOALS: Target date: 09/16/22  Ptwill demonstrate indep in HEP to facilitate  carry-over of skilled services and improve functional outcomes Goal status: IN PROGRESS  2.  Pt will report decrease in pain to 2 to facilitate ease in ADLs Baseline: 3/10 Goal status: IN PROGRESS  3.  Pt will demonstrate improvement of lumbar ext by 25%to facilitate ease in ADLs  Baseline: 50% Goal status: IN PROGRESS  4.  Pt will demonstrate increase in LE strength to 4/5 to facilitate ease and safety in ambulation and ADLs Baseline: 3+/5 Goal status: IN PROGRESS  LONG TERM GOALS: Target date: 10/14/22  Pt will increase FOTO to at least 60 in order to demonstrate significant improvement in function related to ADLs Baseline: 39.75 Goal status: IN PROGRESS  2.  Pt will be able to do bed mobility with little to no pain Goal status: IN PROGRESS PLAN:  PT FREQUENCY: 2x/week  PT DURATION: 8 weeks  PLANNED INTERVENTIONS: Therapeutic exercises, Therapeutic activity, Neuromuscular re-education, Balance training, Gait training, Patient/Family education, Self Care, Joint mobilization, and Manual therapy.  PLAN FOR NEXT SESSION:  Continue POC and may progress as tolerated with emphasis on working with pelvic alignment, core and hip strengthening, as well as LE flexibility.   Harvie Heck. Ifrah Vest, PT, DPT, OCS Board-Certified Clinical Specialist in [place] # ([place]): [id number] T 11:54 AM, 09/09/22

## 2022-09-15 ENCOUNTER — Ambulatory Visit (HOSPITAL_COMMUNITY): Payer: Medicare PPO

## 2022-09-15 DIAGNOSIS — M5459 Other low back pain: Secondary | ICD-10-CM

## 2022-09-15 DIAGNOSIS — M5432 Sciatica, left side: Secondary | ICD-10-CM

## 2022-09-15 DIAGNOSIS — M533 Sacrococcygeal disorders, not elsewhere classified: Secondary | ICD-10-CM | POA: Diagnosis not present

## 2022-09-15 NOTE — Therapy (Signed)
OUTPATIENT PHYSICAL THERAPY TREATMENT   Patient Name: [name] MRN: [id number] DOB:Nov 12, 1940, 82 y.o., female Today's Date: 09/15/2022  END OF SESSION:  PT End of Session - 09/15/22 1050     Visit Number 7    Number of Visits 16    Date for PT Re-Evaluation 10/14/22    Authorization Type [employer] (auth required but no visit limits)    Authorization Time Period 10 visits approved 08/19/22-10/14/22    Authorization - Visit Number 7    Authorization - Number of Visits 10    Progress Note Due on Visit 10    PT Start Time 1030    PT Stop Time 1110    PT Time Calculation (min) 40 min    Activity Tolerance Patient tolerated treatment well            Past Medical History:  Diagnosis Date   Allergy    Anemia    prior to hysterectomy   Asthma    Back pain    comepensating for knee pain per pt   Blood transfusion without reported diagnosis    Cancer ([place])    Cataract    right eye and immature   CHF (congestive heart failure) ([place])    Complication of anesthesia    Constipation    takes Colace daily and Milk of Mag   CTS (carpal tunnel syndrome) 08/31/2016   Dizziness    but goes away real quick   Dry eyes    uses Eye Drops daily as needed   Encounter for general adult medical examination with abnormal findings 07/16/2021   Female hypogonadism syndrome    GERD (gastroesophageal reflux disease)    History of colon polyps    Hyperlipidemia    was on meds but stopped taking back in June 2015   Hypertension    takes Lisinopril daily   Hypothyroidism    takes Synthroid daily   Joint pain    Obesity (BMI 30.0-34.9)    Peripheral arterial disease (HCC)    Pneumonia    as a baby   PONV (postoperative nausea and vomiting)    Primary localized osteoarthritis of left knee 04/17/2015   Primary localized osteoarthritis of right knee    knees   Stage 3a chronic kidney disease ([place]) 10/14/2021   Urinary frequency    Urinary urgency    Past Surgical History:   Procedure Laterality Date   ABDOMINAL HYSTERECTOMY  90's   complete   APPENDECTOMY  80's   BACK SURGERY     CATARACT EXTRACTION W/PHACO Right 12/23/2015   Procedure: CATARACT EXTRACTION PHACO AND INTRAOCULAR LENS PLACEMENT RIGHT EYE CDE=6.42;  Surgeon: Williams Che, MD;  Location: [facility];  Service: Ophthalmology;  Laterality: Right;   CATARACT EXTRACTION W/PHACO Left 05/11/2022   Procedure: CATARACT EXTRACTION PHACO AND INTRAOCULAR LENS PLACEMENT (IOC);  Surgeon: Baruch Goldmann, MD;  Location: [facility];  Service: Ophthalmology;  Laterality: Left;  CDE 10.71   COLONOSCOPY  09/09/2004   [id number] rectum and colon   COLONOSCOPY N/A 11/08/2013   Procedure: COLONOSCOPY;  Surgeon: Daneil Dolin, MD;  Location: [facility];  Service: Endoscopy;  Laterality: N/A;  10:45   COLONOSCOPY WITH PROPOFOL N/A 06/30/2021   Surgeon: Daneil Dolin, MD; diverticulosis in sigmoid, descending, transverse colon, nonbleeding internal hemorrhoids, otherwise normal exam.  No repeat due to age.   JOINT REPLACEMENT     2015, 2016   [facility] REPAIR  10/2007  SPINE SURGERY     ruptured disc   TOTAL KNEE ARTHROPLASTY Right 04/04/2014   DR Noemi Chapel   TOTAL KNEE ARTHROPLASTY Right 04/04/2014   Procedure: RIGHT TOTAL KNEE ARTHROPLASTY;  Surgeon: Lorn Junes, MD;  Location: [facility];  Service: Orthopedics;  Laterality: Right;   TOTAL KNEE ARTHROPLASTY Left 04/29/2015   Procedure: TOTAL KNEE ARTHROPLASTY;  Surgeon: Elsie Saas, MD;  Location: [facility];  Service: Orthopedics;  Laterality: Left;   TUBAL LIGATION     Patient Active Problem List   Diagnosis Date Noted   Sciatica of left side 08/12/2022   Palpitations 07/21/2022   Gastroesophageal reflux disease without esophagitis 02/13/2022   Hemorrhoids 01/07/2022   Stage 3a chronic kidney disease ([place]) 10/14/2021   Encounter for general adult medical examination with abnormal findings 07/16/2021   Bilateral impacted cerumen 04/25/2021    Obesity (BMI 30.0-34.9)    DJD (degenerative joint disease), cervical 10/15/2016   Vitamin D deficiency 09/28/2016   Knee joint replacement status, bilateral 08/31/2016   Chronic constipation 08/31/2016   CTS (carpal tunnel syndrome) 08/31/2016   Hyperlipidemia 04/19/2015   Peripheral arterial disease ([place]) 04/19/2015   DJD (degenerative joint disease) of knee 04/04/2014   Hypertension    Hypothyroidism    Recurrent urinary tract infection    Rectal bleeding 10/31/2013   FH: colon cancer 10/31/2013    PCP: Lindell Spar, MD  REFERRING PROVIDER: Lindell Spar, MD  REFERRING DIAG: [phone] (ICD-10-CM) - Sciatica of left side  Rationale for Evaluation and Treatment: Rehabilitation  THERAPY DIAG:  Sciatica, left side  Other low back pain  ONSET DATE: 3 weeks ago  SUBJECTIVE:                                                                                                                                                                                           SUBJECTIVE STATEMENT: Patient states that she's doing well today and reported that the back is feeling good. Currently reports pain on the low back = 2/10. However on 2 occasions, the L toes can get numb and and tingly upon immediate standing which goes away when she walks  PERTINENT HISTORY:  B TKR, asthma, HTN  PAIN:  Are you having pain? Yes: NPRS scale: 3/10 Pain location: just above the L buttocks Pain description: stabbing, aching Aggravating factors: turning around, moving in the bed Relieving factors: "stretching" the trunk (arching the back)   PATIENT GOALS: "for the pain to absolutely stop"   OBJECTIVE:    B LE swollen. No redness observed  PATIENT SURVEYS:  FOTO 39.75   MUSCLE LENGTH: Mild tightness on B hamstrings,  hip flexors and piriformis   POSTURE: rounded shoulders, forward head, increased lumbar lordosis, and L LE longer in supine (did not get shorter/longer in supine-to-sit), L  ASIS higher in supine and standing (potential L pelvic upslip)  PALPATION: Grade 1 tenderness on L PSIS  LUMBAR ROM:   AROM eval  Flexion 100%  Extension 50%  Right lateral flexion   Left lateral flexion   Right rotation 75%  Left rotation 75%   (Blank rows = not tested)  LOWER EXTREMITY ROM:     Active  Right eval Left eval  Hip flexion [facility]  Hip extension    Hip abduction [facility]  Hip adduction [facility]  Hip internal rotation    Hip external rotation    Knee flexion [facility]  Knee extension [facility]  Ankle dorsiflexion [facility]  Ankle plantarflexion [facility]  Ankle inversion    Ankle eversion     (Blank rows = not tested) Repeated forward flex/ext in standing did not affect the pain.  LOWER EXTREMITY MMT:    MMT Right eval Left eval  Hip flexion 4 4  Hip extension 3+ 3+  Hip abduction 4- 4-  Hip adduction    Hip internal rotation    Hip external rotation    Knee flexion 4+ 4+  Knee extension 4+ 4+  Ankle dorsiflexion 4+ 4+  Ankle plantarflexion 4+ 4+  Ankle inversion    Ankle eversion     (Blank rows = not tested)  LUMBAR SPECIAL TESTS:  H & I test: pain in multiple quadrants Forward bending test: L PSIS has more forward excursion than L (+) Thomas test B (+) Thigh thrust test L (+) Compression test L (-) Distraction test on B (-) SLR on B  (-) Slump test on B (-) Piriformis test  FUNCTIONAL TESTS:  2 minute walk test: 405 ft. . Patient reports that walking alleviates the pain.  GAIT: Distance walked: 405 Assistive device utilized: None Level of assistance: Complete Independence Comments: slightly antalgic  TODAY'S TREATMENT:                                                                                                                              DATE:  09/15/22 Hooklying hip adduction squeeze x 3" x 10 x 2 DL bridging with hip abd, RTB x 3" x 10 x 2 Sidelying clamshells x RTB x 10 x 2 Supine figure-of-4 piriformis stretch x 30" x  3 Seated hamstring stretch x 30" x 3 Seated trunk flex 3-way stretch with a physioball x 30" each direction Standing abdominal isometrics with a physioball x 3" x 10 x 2  09/09/22 Grade 2 inf L hip distraction (anchored on the middle thigh) x 30" x 3 followed by Grade 1 inf thrust on the last rep Hooklying hip adduction squeeze x 3" x 10 x 2 DL bridging with hip abd, RTB x 3" x 10 x 2 Sidelying clamshells  x RTB x 10 x 2 Supine figure-of-4 piriformis stretch x 30" x 3 Seated hamstring stretch x 30" x 3 Seated trunk flex 3-way stretch with a physioball x 30" each direction Standing abdominal isometrics with a physioball x 3" x 10 x 2  09/07/22 Lt LE bridge 2 x 8  RT single knee to chest only x 3 for 30" MET R hip flexion, left hip extension in supine STS with RTB at knees 2 x 10  Sidelying clam 1 x 15 LLE Step up 7 inch 2 x 10 bilateral   09/03/2022: SI checked in standing position : Rt iliac crest higher PSIS higher ASIS lower  Supine sit test:  supine Lt is shorter. Clearing of pubic area with hip isometric adduction/abduction and bridging followed by contract relax to properly align SI  Manual muscle techniques used to anteriorly rotate Lt SI with success after several attempts. Supine:  Lt LE only bridge x 5 RT single knee to chest only x 3 for 30" Clam X 5 B   09/01/22 Hooklying hip abduction isometric with belt at knees 10 x 10 second holds Hooklying hip adduction isometric with ball at knees  10 x 10 second holds Bridge 2 x 10  Standing hip abduction RTB at knees 2x 10  Standing hip extension RTB 2x 10  Lateral stepping RTB at ankles 6 x 6 feet bilateral   08/26/2022 Treatment 1) Nustep 5': #5BLE and #10 BUE- cues for pacing and proper movement patterns  2)Side stepping with RTB at wall; 106f x 10. Cues for reduced hip ER and proper alignment of hip movements.   3) Backwards walking along wall with RTB 134fx 5; cues for proper movement patterns and reduced  compensations.   4)Monster walks with RTB 1045f 5; cues for proper movement patterns and reduced compensations.   5)Standing hip extension with RTG around ankles; 2 x 20 repetitions; Cues for reduced trunk compensations.   6) Single arm deadlifts from elevated mat in LUE. 2 x 10 repetitions with heavy cues and demonstration for proper form.  08/19/21 Evaluation and patient education done (including self-management techniques)   PATIENT EDUCATION:  Education details: updated HEP Person educated: Patient Education method: Explanation, Demonstration, and Handouts Education comprehension: verbalized understanding and returned demonstration  HOME EXERCISE PROGRAM: Access Code: [email or web address] - Seated Hamstring Stretch  - 1 x daily - 7 x weekly - 3 reps - 30 hold - Supine Piriformis Stretch with Foot on Ground  - 1 x daily - 7 x weekly - 3 reps - 30 hold  09/07/22 - Sit to Stand with Resistance Around Legs  - 1 x daily - 7 x weekly - 2 sets - 10 reps  2/15: Lt leg bridge only RT knee to chest  URL: [email or web address] 09/01/22 - Hooklying Isometric Hip Abduction with Belt  - 1 x daily - 7 x weekly - 10 reps - 10 second hold - Supine Hip Adduction Isometric with Ball  - 1 x daily - 7 x weekly - 10 reps - 10 second hold - Bridge  - 1 x daily - 7 x weekly - 2 sets - 10 reps - Side Stepping with Resistance at Ankles  - 1 x daily - 7 x weekly - 3 sets - 10 reps   Date: 08/26/2022 - Side Stepping with Resistance at Ankles and Counter Support  - 1 x daily - 7 x weekly - 3 sets - 10 reps - Standing 3-Way Leg Reach with  Resistance at Ankles and Counter Support  - 1 x daily - 7 x weekly - 3 sets - 10 reps - Standing Hip Abduction with Resistance at Ankles and Counter Support  - 1 x daily - 7 x weekly - 3 sets - 10 repsNone provide to date  ASSESSMENT:  CLINICAL IMPRESSION: L LE is still slightly shorter in supine. Light touch on B toes are intact. Patient present with negative  SLR on B. Interventions today were geared towards LE flexibility, core and hip strengthening. Tolerated all activities without worsening of symptoms. Stood up several times today without c/o numbness/tingling on the L toes. Demonstrated appropriate levels of fatigue. Required moderate amount of cueing to ensure correct execution of activity. To date, skilled PT is required to address the impairments and improve function.   Patient is a 82 y.o. female who was seen today for physical therapy evaluation and treatment for sciatica on L side. Patient was diagnosed with L side sciatica by referring provider further defined by difficulty with bed mobility due to pain, weakness, joint hypomobility, and decreased soft tissue extensibility. However, patient doesn't exhibit signs of neural tension as patient is negative for SLR and Slump test. Patient maybe potentially exhibiting signs of sacroiliac joint dysfunction (hypomobility) on the L side as patient is positive on the forward bending test, thigh thrust test, and compression test. In addition, patient may present a L pelvic upslip as the L ASIS and PSIS are higher in supine and standing respectively. Skilled PT is required to address the impairments and functional limitations listed below. Patient is concerned about her legs being swollen. PT messaged Dr. Posey Pronto via Epic today about patient's concerns. Dr. Posey Pronto responded saying that the swelling is from the prednisone that was given.       OBJECTIVE IMPAIRMENTS: decreased mobility, decreased ROM, decreased strength, hypomobility, impaired flexibility, and pain.   ACTIVITY LIMITATIONS: lifting, bending, and bed mobility  PARTICIPATION LIMITATIONS: meal prep, cleaning, laundry, and community activity  PERSONAL FACTORS: Age are also affecting patient's functional outcome.   REHAB POTENTIAL: Good  CLINICAL DECISION MAKING: Stable/uncomplicated  EVALUATION COMPLEXITY: Low   GOALS: Goals reviewed with  patient? Yes  SHORT TERM GOALS: Target date: 09/16/22  Ptwill demonstrate indep in HEP to facilitate carry-over of skilled services and improve functional outcomes Goal status: IN PROGRESS  2.  Pt will report decrease in pain to 2 to facilitate ease in ADLs Baseline: 3/10 Goal status: IN PROGRESS  3.  Pt will demonstrate improvement of lumbar ext by 25%to facilitate ease in ADLs  Baseline: 50% Goal status: IN PROGRESS  4.  Pt will demonstrate increase in LE strength to 4/5 to facilitate ease and safety in ambulation and ADLs Baseline: 3+/5 Goal status: IN PROGRESS  LONG TERM GOALS: Target date: 10/14/22  Pt will increase FOTO to at least 60 in order to demonstrate significant improvement in function related to ADLs Baseline: 39.75 Goal status: IN PROGRESS  2.  Pt will be able to do bed mobility with little to no pain Goal status: IN PROGRESS PLAN:  PT FREQUENCY: 2x/week  PT DURATION: 8 weeks  PLANNED INTERVENTIONS: Therapeutic exercises, Therapeutic activity, Neuromuscular re-education, Balance training, Gait training, Patient/Family education, Self Care, Joint mobilization, and Manual therapy.  PLAN FOR NEXT SESSION:  Continue POC and may progress as tolerated with emphasis on core and hip strengthening, as well as LE flexibility.   Harvie Heck. Boris Engelmann, PT, DPT, OCS Board-Certified Clinical Specialist in [place] # ([place]):  [id number] T 10:51 AM, 09/15/22

## 2022-09-17 ENCOUNTER — Ambulatory Visit (HOSPITAL_COMMUNITY): Payer: Medicare PPO

## 2022-09-17 DIAGNOSIS — M533 Sacrococcygeal disorders, not elsewhere classified: Secondary | ICD-10-CM

## 2022-09-17 DIAGNOSIS — M5432 Sciatica, left side: Secondary | ICD-10-CM

## 2022-09-17 DIAGNOSIS — M5459 Other low back pain: Secondary | ICD-10-CM | POA: Diagnosis not present

## 2022-09-17 NOTE — Therapy (Signed)
OUTPATIENT PHYSICAL THERAPY TREATMENT   Patient Name: [name] MRN: [id number] DOB:09-29-1940, 82 y.o., female Today's Date: 09/17/2022  END OF SESSION:  PT End of Session - 09/17/22 1136     Visit Number 8    Number of Visits 16    Date for PT Re-Evaluation 10/14/22    Authorization Type [employer] (auth required but no visit limits)    Authorization Time Period 10 visits approved 08/19/22-10/14/22    Authorization - Visit Number 8    Authorization - Number of Visits 10    Progress Note Due on Visit 10    PT Start Time 1030    PT Stop Time 1115    PT Time Calculation (min) 45 min    Activity Tolerance Patient tolerated treatment well    Behavior During Therapy WFL for tasks assessed/performed            Past Medical History:  Diagnosis Date   Allergy    Anemia    prior to hysterectomy   Asthma    Back pain    comepensating for knee pain per pt   Blood transfusion without reported diagnosis    Cancer ([place])    Cataract    right eye and immature   CHF (congestive heart failure) ([place])    Complication of anesthesia    Constipation    takes Colace daily and Milk of Mag   CTS (carpal tunnel syndrome) 08/31/2016   Dizziness    but goes away real quick   Dry eyes    uses Eye Drops daily as needed   Encounter for general adult medical examination with abnormal findings 07/16/2021   Female hypogonadism syndrome    GERD (gastroesophageal reflux disease)    History of colon polyps    Hyperlipidemia    was on meds but stopped taking back in June 2015   Hypertension    takes Lisinopril daily   Hypothyroidism    takes Synthroid daily   Joint pain    Obesity (BMI 30.0-34.9)    Peripheral arterial disease (HCC)    Pneumonia    as a baby   PONV (postoperative nausea and vomiting)    Primary localized osteoarthritis of left knee 04/17/2015   Primary localized osteoarthritis of right knee    knees   Stage 3a chronic kidney disease ([place]) 10/14/2021   Urinary  frequency    Urinary urgency    Past Surgical History:  Procedure Laterality Date   ABDOMINAL HYSTERECTOMY  90's   complete   APPENDECTOMY  80's   BACK SURGERY     CATARACT EXTRACTION W/PHACO Right 12/23/2015   Procedure: CATARACT EXTRACTION PHACO AND INTRAOCULAR LENS PLACEMENT RIGHT EYE CDE=6.42;  Surgeon: Williams Che, MD;  Location: [facility];  Service: Ophthalmology;  Laterality: Right;   CATARACT EXTRACTION W/PHACO Left 05/11/2022   Procedure: CATARACT EXTRACTION PHACO AND INTRAOCULAR LENS PLACEMENT (IOC);  Surgeon: Baruch Goldmann, MD;  Location: [facility];  Service: Ophthalmology;  Laterality: Left;  CDE 10.71   COLONOSCOPY  09/09/2004   [id number] rectum and colon   COLONOSCOPY N/A 11/08/2013   Procedure: COLONOSCOPY;  Surgeon: Daneil Dolin, MD;  Location: [facility];  Service: Endoscopy;  Laterality: N/A;  10:45   COLONOSCOPY WITH PROPOFOL N/A 06/30/2021   Surgeon: Daneil Dolin, MD; diverticulosis in sigmoid, descending, transverse colon, nonbleeding internal hemorrhoids, otherwise normal exam.  No repeat due to age.   JOINT REPLACEMENT     2015, 2016   NECK  SURGERY  1983   RECTOCELE REPAIR  10/2007   SPINE SURGERY     ruptured disc   TOTAL KNEE ARTHROPLASTY Right 04/04/2014   DR Noemi Chapel   TOTAL KNEE ARTHROPLASTY Right 04/04/2014   Procedure: RIGHT TOTAL KNEE ARTHROPLASTY;  Surgeon: Lorn Junes, MD;  Location: [place];  Service: Orthopedics;  Laterality: Right;   TOTAL KNEE ARTHROPLASTY Left 04/29/2015   Procedure: TOTAL KNEE ARTHROPLASTY;  Surgeon: Elsie Saas, MD;  Location: [place];  Service: Orthopedics;  Laterality: Left;   TUBAL LIGATION     Patient Active Problem List   Diagnosis Date Noted   Sciatica of left side 08/12/2022   Palpitations 07/21/2022   Gastroesophageal reflux disease without esophagitis 02/13/2022   Hemorrhoids 01/07/2022   Stage 3a chronic kidney disease ([place]) 10/14/2021   Encounter for general adult medical examination with abnormal  findings 07/16/2021   Bilateral impacted cerumen 04/25/2021   Obesity (BMI 30.0-34.9)    DJD (degenerative joint disease), cervical 10/15/2016   Vitamin D deficiency 09/28/2016   Knee joint replacement status, bilateral 08/31/2016   Chronic constipation 08/31/2016   CTS (carpal tunnel syndrome) 08/31/2016   Hyperlipidemia 04/19/2015   Peripheral arterial disease ([place]) 04/19/2015   DJD (degenerative joint disease) of knee 04/04/2014   Hypertension    Hypothyroidism    Recurrent urinary tract infection    Rectal bleeding 10/31/2013   FH: colon cancer 10/31/2013    PCP: Lindell Spar, MD  REFERRING PROVIDER: Lindell Spar, MD  REFERRING DIAG: [phone] (ICD-10-CM) - Sciatica of left side  Rationale for Evaluation and Treatment: Rehabilitation  THERAPY DIAG:  Sciatica, left side  Other low back pain  Sacroiliac joint dysfunction of left side  ONSET DATE: 3 weeks ago  SUBJECTIVE:                                                                                                                                                                                           SUBJECTIVE STATEMENT: Currently denies tingling on the L toes but c/o tingling on the lat side of the L lower leg (2/10). Claims that the tingling is worse when she stands with the L knee slightly bent and the foot turned in. Patient states that there could be a "knot" on the area when touched. Patient also now reports of pain that is localized on the low back = 1/10. Patient states that she was okay after the last session.  PERTINENT HISTORY:  B TKR, asthma, HTN  PAIN:  Are you having pain? Yes: NPRS scale: 3/10 Pain location: just above the L buttocks Pain description: stabbing, aching Aggravating factors: turning  around, moving in the bed Relieving factors: "stretching" the trunk (arching the back)   PATIENT GOALS: "for the pain to absolutely stop"   OBJECTIVE:    B LE swollen. No redness  observed  PATIENT SURVEYS:  FOTO 39.75   MUSCLE LENGTH: Mild tightness on B hamstrings, hip flexors and piriformis   POSTURE: rounded shoulders, forward head, increased lumbar lordosis, and L LE longer in supine (did not get shorter/longer in supine-to-sit), L ASIS higher in supine and standing (potential L pelvic upslip)  PALPATION: Grade 1 tenderness on L PSIS  LUMBAR ROM:   AROM eval  Flexion 100%  Extension 50%  Right lateral flexion   Left lateral flexion   Right rotation 75%  Left rotation 75%   (Blank rows = not tested)  LOWER EXTREMITY ROM:     Active  Right eval Left eval  Hip flexion [facility]  Hip extension    Hip abduction [facility]  Hip adduction [facility]  Hip internal rotation    Hip external rotation    Knee flexion [facility]  Knee extension [facility]  Ankle dorsiflexion [facility]  Ankle plantarflexion [facility] WFL  Ankle inversion    Ankle eversion     (Blank rows = not tested) Repeated forward flex/ext in standing did not affect the pain.  LOWER EXTREMITY MMT:    MMT Right eval Left eval  Hip flexion 4 4  Hip extension 3+ 3+  Hip abduction 4- 4-  Hip adduction    Hip internal rotation    Hip external rotation    Knee flexion 4+ 4+  Knee extension 4+ 4+  Ankle dorsiflexion 4+ 4+  Ankle plantarflexion 4+ 4+  Ankle inversion    Ankle eversion     (Blank rows = not tested)  LUMBAR SPECIAL TESTS:  H & I test: pain in multiple quadrants Forward bending test: L PSIS has more forward excursion than L (+) Thomas test B (+) Thigh thrust test L (+) Compression test L (-) Distraction test on B (-) SLR on B  (-) Slump test on B (-) Piriformis test  FUNCTIONAL TESTS:  2 minute walk test: 405 ft. . Patient reports that walking alleviates the pain.  GAIT: Distance walked: 405 Assistive device utilized: None Level of assistance: Complete Independence Comments: slightly antalgic  TODAY'S TREATMENT:                                                                                                                               DATE:  09/17/22 Hooklying hip adduction squeeze x 5" x 10 x 2 DL bridging with hip abd, GTB x 5" x 10 x 2 Sidelying clamshells x GTB x 10 x 2 Supine figure-of-4 piriformis stretch x 30" x 3 Seated hamstring stretch x 30" x 3 Seated trunk flex 3-way stretch with a physioball x 30" each direction Standing abdominal isometrics with a physioball, neutral spine x 3" x 10 x  2 Pallof press, RTB x 10 x 2  09/15/22 Hooklying hip adduction squeeze x 3" x 10 x 2 DL bridging with hip abd, RTB x 3" x 10 x 2 Sidelying clamshells x RTB x 10 x 2 Supine figure-of-4 piriformis stretch x 30" x 3 Seated hamstring stretch x 30" x 3 Seated trunk flex 3-way stretch with a physioball x 30" each direction Standing abdominal isometrics with a physioball x 3" x 10 x 2  09/09/22 Grade 2 inf L hip distraction (anchored on the middle thigh) x 30" x 3 followed by Grade 1 inf thrust on the last rep Hooklying hip adduction squeeze x 3" x 10 x 2 DL bridging with hip abd, RTB x 3" x 10 x 2 Sidelying clamshells x RTB x 10 x 2 Supine figure-of-4 piriformis stretch x 30" x 3 Seated hamstring stretch x 30" x 3 Seated trunk flex 3-way stretch with a physioball x 30" each direction Standing abdominal isometrics with a physioball x 3" x 10 x 2  09/07/22 Lt LE bridge 2 x 8  RT single knee to chest only x 3 for 30" MET R hip flexion, left hip extension in supine STS with RTB at knees 2 x 10  Sidelying clam 1 x 15 LLE Step up 7 inch 2 x 10 bilateral   09/03/2022: SI checked in standing position : Rt iliac crest higher PSIS higher ASIS lower  Supine sit test:  supine Lt is shorter. Clearing of pubic area with hip isometric adduction/abduction and bridging followed by contract relax to properly align SI  Manual muscle techniques used to anteriorly rotate Lt SI with success after several attempts. Supine:  Lt LE only bridge x 5 RT single knee to  chest only x 3 for 30" Clam X 5 B   09/01/22 Hooklying hip abduction isometric with belt at knees 10 x 10 second holds Hooklying hip adduction isometric with ball at knees  10 x 10 second holds Bridge 2 x 10  Standing hip abduction RTB at knees 2x 10  Standing hip extension RTB 2x 10  Lateral stepping RTB at ankles 6 x 6 feet bilateral   08/26/2022 Treatment 1) Nustep 5': #5BLE and #10 BUE- cues for pacing and proper movement patterns  2)Side stepping with RTB at wall; 57f x 10. Cues for reduced hip ER and proper alignment of hip movements.   3) Backwards walking along wall with RTB 197fx 5; cues for proper movement patterns and reduced compensations.   4)Monster walks with RTB 1045f 5; cues for proper movement patterns and reduced compensations.   5)Standing hip extension with RTG around ankles; 2 x 20 repetitions; Cues for reduced trunk compensations.   6) Single arm deadlifts from elevated mat in LUE. 2 x 10 repetitions with heavy cues and demonstration for proper form.  08/19/21 Evaluation and patient education done (including self-management techniques)   PATIENT EDUCATION:  Education details: updated HEP Person educated: Patient Education method: Explanation, Demonstration, and Handouts Education comprehension: verbalized understanding and returned demonstration  HOME EXERCISE PROGRAM: Access Code: [email or web address] - Seated Hamstring Stretch  - 1 x daily - 7 x weekly - 3 reps - 30 hold - Supine Piriformis Stretch with Foot on Ground  - 1 x daily - 7 x weekly - 3 reps - 30 hold  09/07/22 - Sit to Stand with Resistance Around Legs  - 1 x daily - 7 x weekly - 2 sets - 10 reps  2/15: Lt leg  bridge only RT knee to chest  URL: [email or web address] 09/[email or web address]/22 - Hooklying Isometric Hip Abduction with Belt  - 1 x daily - 7 x weekly - 10 reps - 10 second hold - Supine Hip Adduction Isometric with Ball  - 1 x daily - 7 x weekly - 10 reps - 10 second hold - Bridge  - 1  x daily - 7 x weekly - 2 sets - 10 reps - Side Stepping with Resistance at Ankles  - 1 x daily - 7 x weekly - 3 sets - 10 reps   Date: 08/26/2022 - Side Stepping with Resistance at Ankles and Counter Support  - 1 x daily - 7 x weekly - 3 sets - 10 reps - Standing 3-Way Leg Reach with Resistance at Ankles and Counter Support  - 1 x daily - 7 x weekly - 3 sets - 10 reps - Standing Hip Abduction with Resistance at Ankles and Counter Support  - 1 x daily - 7 x weekly - 3 sets - 10 repsNone provide to date  ASSESSMENT:  CLINICAL IMPRESSION: Palpated the lat aspect of the L lower leg but no nodule/taut band was felt. No tenderness or worsening numbness also reported on the area upon palpation. Interventions today were geared towards LE flexibility, core and hip strengthening. Tolerated all activities without worsening of symptoms but with slight difficulty with today's progression due to weakness. Patient reports that the numbness and tingling on the area complained were gone at the end of the session. Required moderate amount of cueing to ensure correct execution of activity with fair to poor carry-over. To date, skilled PT is required to address the impairments and improve function.   Patient is a 82 y.o. female who was seen today for physical therapy evaluation and treatment for sciatica on L side. Patient was diagnosed with L side sciatica by referring provider further defined by difficulty with bed mobility due to pain, weakness, joint hypomobility, and decreased soft tissue extensibility. However, patient doesn't exhibit signs of neural tension as patient is negative for SLR and Slump test. Patient maybe potentially exhibiting signs of sacroiliac joint dysfunction (hypomobility) on the L side as patient is positive on the forward bending test, thigh thrust test, and compression test. In addition, patient may present a L pelvic upslip as the L ASIS and PSIS are higher in supine and standing  respectively. Skilled PT is required to address the impairments and functional limitations listed below. Patient is concerned about her legs being swollen. PT messaged Dr. Posey Pronto via Epic today about patient's concerns. Dr. Posey Pronto responded saying that the swelling is from the prednisone that was given.       OBJECTIVE IMPAIRMENTS: decreased mobility, decreased ROM, decreased strength, hypomobility, impaired flexibility, and pain.   ACTIVITY LIMITATIONS: lifting, bending, and bed mobility  PARTICIPATION LIMITATIONS: meal prep, cleaning, laundry, and community activity  PERSONAL FACTORS: Age are also affecting patient's functional outcome.   REHAB POTENTIAL: Good  CLINICAL DECISION MAKING: Stable/uncomplicated  EVALUATION COMPLEXITY: Low   GOALS: Goals reviewed with patient? Yes  SHORT TERM GOALS: Target date: 09/16/22  Ptwill demonstrate indep in HEP to facilitate carry-over of skilled services and improve functional outcomes Goal status: IN PROGRESS  2.  Pt will report decrease in pain to 2 to facilitate ease in ADLs Baseline: 3/10 Goal status: IN PROGRESS  3.  Pt will demonstrate improvement of lumbar ext by 25%to facilitate ease in ADLs  Baseline: 50% Goal status: IN PROGRESS  4.  Pt  will demonstrate increase in LE strength to 4/5 to facilitate ease and safety in ambulation and ADLs Baseline: 3+/5 Goal status: IN PROGRESS  LONG TERM GOALS: Target date: 10/14/22  Pt will increase FOTO to at least 60 in order to demonstrate significant improvement in function related to ADLs Baseline: 39.75 Goal status: IN PROGRESS  2.  Pt will be able to do bed mobility with little to no pain Goal status: IN PROGRESS PLAN:  PT FREQUENCY: 2x/week  PT DURATION: 8 weeks  PLANNED INTERVENTIONS: Therapeutic exercises, Therapeutic activity, Neuromuscular re-education, Balance training, Gait training, Patient/Family education, Self Care, Joint mobilization, and Manual therapy.  PLAN FOR  NEXT SESSION:  Continue POC and may progress as tolerated with emphasis on core and hip strengthening, as well as LE flexibility.   Harvie Heck. Warnie Belair, PT, DPT, OCS Board-Certified Clinical Specialist in [place] # ([place]): [id number] T 11:37 AM, 09/17/22

## 2022-09-22 ENCOUNTER — Encounter (HOSPITAL_COMMUNITY): Payer: Medicare PPO

## 2022-09-24 ENCOUNTER — Ambulatory Visit (HOSPITAL_COMMUNITY): Payer: Medicare PPO | Attending: Internal Medicine

## 2022-09-24 DIAGNOSIS — M5459 Other low back pain: Secondary | ICD-10-CM | POA: Diagnosis not present

## 2022-09-24 DIAGNOSIS — M5432 Sciatica, left side: Secondary | ICD-10-CM | POA: Insufficient documentation

## 2022-09-24 DIAGNOSIS — M533 Sacrococcygeal disorders, not elsewhere classified: Secondary | ICD-10-CM | POA: Insufficient documentation

## 2022-09-24 NOTE — Therapy (Signed)
OUTPATIENT PHYSICAL THERAPY TREATMENT   Patient Name: [name] MRN: [id number] DOB:September 11, 1940, 82 y.o., female Today's Date: 09/24/2022  END OF SESSION:  PT End of Session - 09/24/22 1034     Visit Number 9    Number of Visits 16    Date for PT Re-Evaluation 10/14/22    Authorization Type [employer] (auth required but no visit limits)    Authorization Time Period 10 visits approved 08/19/22-10/14/22    Authorization - Visit Number 9    Authorization - Number of Visits 10    Progress Note Due on Visit 10    PT Start Time 1030    PT Stop Time 1110    PT Time Calculation (min) 40 min    Activity Tolerance Patient tolerated treatment well    Behavior During Therapy WFL for tasks assessed/performed            Past Medical History:  Diagnosis Date   Allergy    Anemia    prior to hysterectomy   Asthma    Back pain    comepensating for knee pain per pt   Blood transfusion without reported diagnosis    Cancer ([place])    Cataract    right eye and immature   CHF (congestive heart failure) ([place])    Complication of anesthesia    Constipation    takes Colace daily and Milk of Mag   CTS (carpal tunnel syndrome) 08/31/2016   Dizziness    but goes away real quick   Dry eyes    uses Eye Drops daily as needed   Encounter for general adult medical examination with abnormal findings 07/16/2021   Female hypogonadism syndrome    GERD (gastroesophageal reflux disease)    History of colon polyps    Hyperlipidemia    was on meds but stopped taking back in June 2015   Hypertension    takes Lisinopril daily   Hypothyroidism    takes Synthroid daily   Joint pain    Obesity (BMI 30.0-34.9)    Peripheral arterial disease (HCC)    Pneumonia    as a baby   PONV (postoperative nausea and vomiting)    Primary localized osteoarthritis of left knee 04/17/2015   Primary localized osteoarthritis of right knee    knees   Stage 3a chronic kidney disease ([place]) 10/14/2021   Urinary  frequency    Urinary urgency    Past Surgical History:  Procedure Laterality Date   ABDOMINAL HYSTERECTOMY  90's   complete   APPENDECTOMY  80's   BACK SURGERY     CATARACT EXTRACTION W/PHACO Right 12/23/2015   Procedure: CATARACT EXTRACTION PHACO AND INTRAOCULAR LENS PLACEMENT RIGHT EYE CDE=6.42;  Surgeon: Williams Che, MD;  Location: [facility];  Service: Ophthalmology;  Laterality: Right;   CATARACT EXTRACTION W/PHACO Left 05/11/2022   Procedure: CATARACT EXTRACTION PHACO AND INTRAOCULAR LENS PLACEMENT (IOC);  Surgeon: Baruch Goldmann, MD;  Location: [facility];  Service: Ophthalmology;  Laterality: Left;  CDE 10.71   COLONOSCOPY  09/09/2004   [id number] rectum and colon   COLONOSCOPY N/A 11/08/2013   Procedure: COLONOSCOPY;  Surgeon: Daneil Dolin, MD;  Location: [facility];  Service: Endoscopy;  Laterality: N/A;  10:45   COLONOSCOPY WITH PROPOFOL N/A 06/30/2021   Surgeon: Daneil Dolin, MD; diverticulosis in sigmoid, descending, transverse colon, nonbleeding internal hemorrhoids, otherwise normal exam.  No repeat due to age.   JOINT REPLACEMENT     2015, 2016   NECK  SURGERY  1983   RECTOCELE REPAIR  10/2007   SPINE SURGERY     ruptured disc   TOTAL KNEE ARTHROPLASTY Right 04/04/2014   DR Noemi Chapel   TOTAL KNEE ARTHROPLASTY Right 04/04/2014   Procedure: RIGHT TOTAL KNEE ARTHROPLASTY;  Surgeon: Lorn Junes, MD;  Location: [facility];  Service: Orthopedics;  Laterality: Right;   TOTAL KNEE ARTHROPLASTY Left 04/29/2015   Procedure: TOTAL KNEE ARTHROPLASTY;  Surgeon: Elsie Saas, MD;  Location: [place];  Service: Orthopedics;  Laterality: Left;   TUBAL LIGATION     Patient Active Problem List   Diagnosis Date Noted   Sciatica of left side 08/12/2022   Palpitations 07/21/2022   Gastroesophageal reflux disease without esophagitis 02/13/2022   Hemorrhoids 01/07/2022   Stage 3a chronic kidney disease ([place]) 10/14/2021   Encounter for general adult medical examination with abnormal  findings 07/16/2021   Bilateral impacted cerumen 04/25/2021   Obesity (BMI 30.0-34.9)    DJD (degenerative joint disease), cervical 10/15/2016   Vitamin D deficiency 09/28/2016   Knee joint replacement status, bilateral 08/31/2016   Chronic constipation 08/31/2016   CTS (carpal tunnel syndrome) 08/31/2016   Hyperlipidemia 04/19/2015   Peripheral arterial disease ([place]) 04/19/2015   DJD (degenerative joint disease) of knee 04/04/2014   Hypertension    Hypothyroidism    Recurrent urinary tract infection    Rectal bleeding 10/31/2013   FH: colon cancer 10/31/2013    PCP: Lindell Spar, MD  REFERRING PROVIDER: Lindell Spar, MD  REFERRING DIAG: [phone] (ICD-10-CM) - Sciatica of left side  Rationale for Evaluation and Treatment: Rehabilitation  THERAPY DIAG:  No diagnosis found.  ONSET DATE: 3 weeks ago  SUBJECTIVE:                                                                                                                                                                                           SUBJECTIVE STATEMENT: Currently denies any tingling on the lat side of the L lower leg or the toes. However, there are some times that she will have it. Patient reports of low back pain = 2/10 because she sat most of the day on her recliner yesterday.  PERTINENT HISTORY:  B TKR, asthma, HTN  PAIN:  Are you having pain? Yes: NPRS scale: 3/10 Pain location: just above the L buttocks Pain description: stabbing, aching Aggravating factors: turning around, moving in the bed Relieving factors: "stretching" the trunk (arching the back)   PATIENT GOALS: "for the pain to absolutely stop"   OBJECTIVE:    B LE swollen. No redness observed  PATIENT SURVEYS:  FOTO 39.75   MUSCLE LENGTH:  Mild tightness on B hamstrings, hip flexors and piriformis   POSTURE: rounded shoulders, forward head, increased lumbar lordosis, and L LE longer in supine (did not get shorter/longer in  supine-to-sit), L ASIS higher in supine and standing (potential L pelvic upslip)  PALPATION: Grade 1 tenderness on L PSIS  LUMBAR ROM:   AROM eval  Flexion 100%  Extension 50%  Right lateral flexion   Left lateral flexion   Right rotation 75%  Left rotation 75%   (Blank rows = not tested)  LOWER EXTREMITY ROM:     Active  Right eval Left eval  Hip flexion [facility]  Hip extension    Hip abduction [facility]  Hip adduction [facility]  Hip internal rotation    Hip external rotation    Knee flexion [facility]  Knee extension [facility]  Ankle dorsiflexion [facility]  Ankle plantarflexion [facility] WFL  Ankle inversion    Ankle eversion     (Blank rows = not tested) Repeated forward flex/ext in standing did not affect the pain.  LOWER EXTREMITY MMT:    MMT Right eval Left eval  Hip flexion 4 4  Hip extension 3+ 3+  Hip abduction 4- 4-  Hip adduction    Hip internal rotation    Hip external rotation    Knee flexion 4+ 4+  Knee extension 4+ 4+  Ankle dorsiflexion 4+ 4+  Ankle plantarflexion 4+ 4+  Ankle inversion    Ankle eversion     (Blank rows = not tested)  LUMBAR SPECIAL TESTS:  H & I test: pain in multiple quadrants Forward bending test: L PSIS has more forward excursion than L (+) Thomas test B (+) Thigh thrust test L (+) Compression test L (-) Distraction test on B (-) SLR on B  (-) Slump test on B (-) Piriformis test  FUNCTIONAL TESTS:  2 minute walk test: 405 ft. . Patient reports that walking alleviates the pain.  GAIT: Distance walked: 405 Assistive device utilized: None Level of assistance: Complete Independence Comments: slightly antalgic  TODAY'S TREATMENT:                                                                                                                              DATE:  09/24/22 Supine figure-of-4 piriformis stretch x 30" x 3 Seated hamstring stretch x 30" x 3 Seated marches, neutral spine x 10 x 2 Seated alt knee ext, neutral spine x  10 x 2 Standing abdominal isometrics with a physioball, neutral spine x 3" x 10 x 2 Standing hip abd x 10 x 2 x 2 lbs Standing hip ext x 10 x 2 x 2 lbs Standing heel/toe raises x 10 x 2 x 2 lbs Pallof press, RTB x 10 x 2  09/17/22 Hooklying hip adduction squeeze x 5" x 10 x 2 DL bridging with hip abd, GTB x 5" x 10 x 2 Sidelying clamshells x GTB x 10 x  2 Supine figure-of-4 piriformis stretch x 30" x 3 Seated hamstring stretch x 30" x 3 Seated trunk flex 3-way stretch with a physioball x 30" each direction Standing abdominal isometrics with a physioball, neutral spine x 3" x 10 x 2 Pallof press, RTB x 10 x 2  09/15/22 Hooklying hip adduction squeeze x 3" x 10 x 2 DL bridging with hip abd, RTB x 3" x 10 x 2 Sidelying clamshells x RTB x 10 x 2 Supine figure-of-4 piriformis stretch x 30" x 3 Seated hamstring stretch x 30" x 3 Seated trunk flex 3-way stretch with a physioball x 30" each direction Standing abdominal isometrics with a physioball x 3" x 10 x 2  09/09/22 Grade 2 inf L hip distraction (anchored on the middle thigh) x 30" x 3 followed by Grade 1 inf thrust on the last rep Hooklying hip adduction squeeze x 3" x 10 x 2 DL bridging with hip abd, RTB x 3" x 10 x 2 Sidelying clamshells x RTB x 10 x 2 Supine figure-of-4 piriformis stretch x 30" x 3 Seated hamstring stretch x 30" x 3 Seated trunk flex 3-way stretch with a physioball x 30" each direction Standing abdominal isometrics with a physioball x 3" x 10 x 2  09/07/22 Lt LE bridge 2 x 8  RT single knee to chest only x 3 for 30" MET R hip flexion, left hip extension in supine STS with RTB at knees 2 x 10  Sidelying clam 1 x 15 LLE Step up 7 inch 2 x 10 bilateral   09/03/2022: SI checked in standing position : Rt iliac crest higher PSIS higher ASIS lower  Supine sit test:  supine Lt is shorter. Clearing of pubic area with hip isometric adduction/abduction and bridging followed by contract relax to properly align SI   Manual muscle techniques used to anteriorly rotate Lt SI with success after several attempts. Supine:  Lt LE only bridge x 5 RT single knee to chest only x 3 for 30" Clam X 5 B   09/01/22 Hooklying hip abduction isometric with belt at knees 10 x 10 second holds Hooklying hip adduction isometric with ball at knees  10 x 10 second holds Bridge 2 x 10  Standing hip abduction RTB at knees 2x 10  Standing hip extension RTB 2x 10  Lateral stepping RTB at ankles 6 x 6 feet bilateral   08/26/2022 Treatment 1) Nustep 5': #5BLE and #10 BUE- cues for pacing and proper movement patterns  2)Side stepping with RTB at wall; 72f x 10. Cues for reduced hip ER and proper alignment of hip movements.   3) Backwards walking along wall with RTB 131fx 5; cues for proper movement patterns and reduced compensations.   4)Monster walks with RTB 1034f 5; cues for proper movement patterns and reduced compensations.   5)Standing hip extension with RTG around ankles; 2 x 20 repetitions; Cues for reduced trunk compensations.   6) Single arm deadlifts from elevated mat in LUE. 2 x 10 repetitions with heavy cues and demonstration for proper form.  08/19/21 Evaluation and patient education done (including self-management techniques)   PATIENT EDUCATION:  Education details: updated HEP Person educated: Patient Education method: Explanation, Demonstration, and Handouts Education comprehension: verbalized understanding and returned demonstration  HOME EXERCISE PROGRAM: Access Code: [email or web address] - Seated March  - 1 x daily - 7 x weekly - 2 sets - 10 reps  09/09/22 - Seated Hamstring Stretch  - 1 x daily -  7 x weekly - 3 reps - 30 hold - Supine Piriformis Stretch with Foot on Ground  - 1 x daily - 7 x weekly - 3 reps - 30 hold  09/07/22 - Sit to Stand with Resistance Around Legs  - 1 x daily - 7 x weekly - 2 sets - 10 reps  2/15: Lt leg bridge only RT knee to chest  URL:  [email or web address] 09/[email or web address]/22 - Hooklying Isometric Hip Abduction with Belt  - 1 x daily - 7 x weekly - 10 reps - 10 second hold - Supine Hip Adduction Isometric with Ball  - 1 x daily - 7 x weekly - 10 reps - 10 second hold - Bridge  - 1 x daily - 7 x weekly - 2 sets - 10 reps - Side Stepping with Resistance at Ankles  - 1 x daily - 7 x weekly - 3 sets - 10 reps   Date: 08/26/2022 - Side Stepping with Resistance at Ankles and Counter Support  - 1 x daily - 7 x weekly - 3 sets - 10 reps - Standing 3-Way Leg Reach with Resistance at Ankles and Counter Support  - 1 x daily - 7 x weekly - 3 sets - 10 reps - Standing Hip Abduction with Resistance at Ankles and Counter Support  - 1 x daily - 7 x weekly - 3 sets - 10 repsNone provide to date  ASSESSMENT:  CLINICAL IMPRESSION: Interventions today were geared towards LE flexibility, core and hip strengthening. Tolerated all activities without worsening of symptoms but reported with some tingling on the lat aspect of the left lower leg upon standing which immediately went away. Required moderate amount of cueing to ensure correct execution of activity with fair to fair carry-over especially on core stabilization exercises due to weakness. To date, skilled PT is required to address the impairments and improve function.   Patient is a 82 y.o. female who was seen today for physical therapy evaluation and treatment for sciatica on L side. Patient was diagnosed with L side sciatica by referring provider further defined by difficulty with bed mobility due to pain, weakness, joint hypomobility, and decreased soft tissue extensibility. However, patient doesn't exhibit signs of neural tension as patient is negative for SLR and Slump test. Patient maybe potentially exhibiting signs of sacroiliac joint dysfunction (hypomobility) on the L side as patient is positive on the forward bending test, thigh thrust test, and compression test. In addition, patient  may present a L pelvic upslip as the L ASIS and PSIS are higher in supine and standing respectively. Skilled PT is required to address the impairments and functional limitations listed below. Patient is concerned about her legs being swollen. PT messaged Dr. Posey Pronto via Epic today about patient's concerns. Dr. Posey Pronto responded saying that the swelling is from the prednisone that was given.       OBJECTIVE IMPAIRMENTS: decreased mobility, decreased ROM, decreased strength, hypomobility, impaired flexibility, and pain.   ACTIVITY LIMITATIONS: lifting, bending, and bed mobility  PARTICIPATION LIMITATIONS: meal prep, cleaning, laundry, and community activity  PERSONAL FACTORS: Age are also affecting patient's functional outcome.   REHAB POTENTIAL: Good  CLINICAL DECISION MAKING: Stable/uncomplicated  EVALUATION COMPLEXITY: Low   GOALS: Goals reviewed with patient? Yes  SHORT TERM GOALS: Target date: 09/16/22  Ptwill demonstrate indep in HEP to facilitate carry-over of skilled services and improve functional outcomes Goal status: IN PROGRESS  2.  Pt will report decrease in pain to 2 to facilitate ease in ADLs  Baseline: 3/10 Goal status: IN PROGRESS  3.  Pt will demonstrate improvement of lumbar ext by 25%to facilitate ease in ADLs  Baseline: 50% Goal status: IN PROGRESS  4.  Pt will demonstrate increase in LE strength to 4/5 to facilitate ease and safety in ambulation and ADLs Baseline: 3+/5 Goal status: IN PROGRESS  LONG TERM GOALS: Target date: 10/14/22  Pt will increase FOTO to at least 60 in order to demonstrate significant improvement in function related to ADLs Baseline: 39.75 Goal status: IN PROGRESS  2.  Pt will be able to do bed mobility with little to no pain Goal status: IN PROGRESS PLAN:  PT FREQUENCY: 2x/week  PT DURATION: 8 weeks  PLANNED INTERVENTIONS: Therapeutic exercises, Therapeutic activity, Neuromuscular re-education, Balance training, Gait training,  Patient/Family education, Self Care, Joint mobilization, and Manual therapy.  PLAN FOR NEXT SESSION:  Continue POC and may progress as tolerated with emphasis on core and hip strengthening, as well as LE flexibility.   Harvie Heck. Seanpatrick Maisano, PT, DPT, OCS Board-Certified Clinical Specialist in [place] # ([place]): [id number] T 10:34 AM, 09/24/22

## 2022-09-29 ENCOUNTER — Ambulatory Visit (HOSPITAL_COMMUNITY): Payer: Medicare PPO

## 2022-09-29 DIAGNOSIS — M533 Sacrococcygeal disorders, not elsewhere classified: Secondary | ICD-10-CM | POA: Diagnosis not present

## 2022-09-29 DIAGNOSIS — M5432 Sciatica, left side: Secondary | ICD-10-CM | POA: Diagnosis not present

## 2022-09-29 DIAGNOSIS — M5459 Other low back pain: Secondary | ICD-10-CM | POA: Diagnosis not present

## 2022-09-29 NOTE — Therapy (Signed)
OUTPATIENT PHYSICAL THERAPY DISCHARGE/PROGRESS NOTE   Patient Name: [name] MRN: [id number] DOB:Jan 16, 1941, 82 y.o., female Today's Date: 09/29/2022  PHYSICAL THERAPY DISCHARGE SUMMARY  Visits from Start of Care: 10  Current functional level related to goals / functional outcomes: See below   Remaining deficits: See below   Education / Equipment: See below   Patient agrees to discharge. Patient goals were met. Patient is being discharged due to meeting the stated rehab goals.   Progress Note Reporting Period 08/19/2022 to 09/29/2022  See note below for Objective Data and Assessment of Progress/Goals.     END OF SESSION:  PT End of Session - 09/29/22 1243     Visit Number 10    Number of Visits 16    Date for PT Re-Evaluation 10/14/22    Authorization Type [employer] (auth required but no visit limits)    Authorization Time Period 10 visits approved 08/19/22-10/14/22    Authorization - Visit Number 10    Authorization - Number of Visits 10    Progress Note Due on Visit 10    PT Start Time 1030    PT Stop Time 1110    PT Time Calculation (min) 40 min    Activity Tolerance Patient tolerated treatment well    Behavior During Therapy WFL for tasks assessed/performed            Past Medical History:  Diagnosis Date   Allergy    Anemia    prior to hysterectomy   Asthma    Back pain    comepensating for knee pain per pt   Blood transfusion without reported diagnosis    Cancer ([place])    Cataract    right eye and immature   CHF (congestive heart failure) ([place])    Complication of anesthesia    Constipation    takes Colace daily and Milk of Mag   CTS (carpal tunnel syndrome) 08/31/2016   Dizziness    but goes away real quick   Dry eyes    uses Eye Drops daily as needed   Encounter for general adult medical examination with abnormal findings 07/16/2021   Female hypogonadism syndrome    GERD (gastroesophageal reflux disease)    History of colon  polyps    Hyperlipidemia    was on meds but stopped taking back in June 2015   Hypertension    takes Lisinopril daily   Hypothyroidism    takes Synthroid daily   Joint pain    Obesity (BMI 30.0-34.9)    Peripheral arterial disease (H[facility])    Pneumonia    as a baby   PONV (postoperative nausea and vomiting)    Primary localized osteoarthritis of left knee 04/17/2015   Primary localized osteoarthritis of right knee    knees   Stage 3a chronic kidney disease ([place]) 10/14/2021   Urinary frequency    Urinary urgency    Past Surgical History:  Procedure Laterality Date   ABDOMINAL HYSTERECTOMY  90's   complete   APPENDECTOMY  80's   BACK SURGERY     CATARACT EXTRACTION W/PHACO Right 12/23/2015   Procedure: CATARACT EXTRACTION PHACO AND INTRAOCULAR LENS PLACEMENT RIGHT EYE CDE=6.42;  Surgeon: Williams Che, MD;  Location: [facility];  Service: Ophthalmology;  Laterality: Right;   CATARACT EXTRACTION W/PHACO Left 05/11/2022   Procedure: CATARACT EXTRACTION PHACO AND INTRAOCULAR LENS PLACEMENT (IOC);  Surgeon: Baruch Goldmann, MD;  Location: [facility];  Service: Ophthalmology;  Laterality: Left;  CDE 10.71  COLONOSCOPY  09/09/2004   [id number] rectum and colon   COLONOSCOPY N/A 11/08/2013   Procedure: COLONOSCOPY;  Surgeon: Daneil Dolin, MD;  Location: [facility];  Service: Endoscopy;  Laterality: N/A;  10:45   COLONOSCOPY WITH PROPOFOL N/A 06/30/2021   Surgeon: Daneil Dolin, MD; diverticulosis in sigmoid, descending, transverse colon, nonbleeding internal hemorrhoids, otherwise normal exam.  No repeat due to age.   JOINT REPLACEMENT     2015, 2016   [place]   RECTOCELE REPAIR  10/2007   SPINE SURGERY     ruptured disc   TOTAL KNEE ARTHROPLASTY Right 04/04/2014   DR Noemi Chapel   TOTAL KNEE ARTHROPLASTY Right 04/04/2014   Procedure: RIGHT TOTAL KNEE ARTHROPLASTY;  Surgeon: Lorn Junes, MD;  Location: [place];  Service: Orthopedics;  Laterality: Right;   TOTAL KNEE  ARTHROPLASTY Left 04/29/2015   Procedure: TOTAL KNEE ARTHROPLASTY;  Surgeon: Elsie Saas, MD;  Location: [place];  Service: Orthopedics;  Laterality: Left;   TUBAL LIGATION     Patient Active Problem List   Diagnosis Date Noted   Sciatica of left side 08/12/2022   Palpitations 07/21/2022   Gastroesophageal reflux disease without esophagitis 02/13/2022   Hemorrhoids 01/07/2022   Stage 3a chronic kidney disease ([place]) 10/14/2021   Encounter for general adult medical examination with abnormal findings 07/16/2021   Bilateral impacted cerumen 04/25/2021   Obesity (BMI 30.0-34.9)    DJD (degenerative joint disease), cervical 10/15/2016   Vitamin D deficiency 09/28/2016   Knee joint replacement status, bilateral 08/31/2016   Chronic constipation 08/31/2016   CTS (carpal tunnel syndrome) 08/31/2016   Hyperlipidemia 04/19/2015   Peripheral arterial disease ([place]) 04/19/2015   DJD (degenerative joint disease) of knee 04/04/2014   Hypertension    Hypothyroidism    Recurrent urinary tract infection    Rectal bleeding 10/31/2013   FH: colon cancer 10/31/2013    PCP: Lindell Spar, MD  REFERRING PROVIDER: Lindell Spar, MD  REFERRING DIAG: [phone] (ICD-10-CM) - Sciatica of left side  Rationale for Evaluation and Treatment: Rehabilitation  THERAPY DIAG:  Sciatica, left side  Sacroiliac joint dysfunction of left side  Other low back pain  ONSET DATE: 3 weeks ago  SUBJECTIVE:                                                                                                                                                                                           SUBJECTIVE STATEMENT: Patient denies pain (0/10) but states that her back is not painful but just sore from raking the leaves yesterday. Denies any tingling on the legs at the moment  but claims that her legs will tingle upon standing after prolonged sitting. Tingling only lasts for a few seconds and then it goes away. Patient  states that PT has helped her a lot and thinks that she's ready to be D/C but she thinks she needs to continue her HEP. Patient doesn't want to review the HEP as she still has a copy of the exercises.  PERTINENT HISTORY:  B TKR, asthma, HTN  PAIN:  Are you having pain? Yes: NPRS scale: 0/10 Pain location: just above the L buttocks Pain description: stabbing, aching Aggravating factors: turning around, moving in the bed Relieving factors: "stretching" the trunk (arching the back)   PATIENT GOALS: "for the pain to absolutely stop"   OBJECTIVE:   LE not swollen  PATIENT SURVEYS:  FOTO 82 from 39.75.   MUSCLE LENGTH: Slight tightness on B hamstrings and piriformis   POSTURE: rounded shoulders, forward head, increased lumbar lordosis, and L LE still a little shorter than R  PALPATION: No tenderness on L PSIS  LUMBAR ROM:   AROM eval 09/29/22  Flexion 100% 100%  Extension 50% 100%  Right lateral flexion    Left lateral flexion    Right rotation 75% 100%  Left rotation 75% 100%   (Blank rows = not tested)  LOWER EXTREMITY ROM:     Active  Right Eval and  09/29/22 Left Eval and  09/29/22  Hip flexion [facility]  Hip extension    Hip abduction [facility]  Hip adduction [facility]  Hip internal rotation    Hip external rotation    Knee flexion [facility] WFL  Knee extension [facility] WFL  Ankle dorsiflexion WFL WFL  Ankle plantarflexion WFL WFL  Ankle inversion    Ankle eversion     (Blank rows = not tested)   LOWER EXTREMITY MMT:    MMT Right eval Left eval  Hip flexion 4+ 4+  Hip extension 4+ 4+  Hip abduction 4+ 4+  Hip adduction    Hip internal rotation    Hip external rotation    Knee flexion 4+ 4+  Knee extension 4+ 4+  Ankle dorsiflexion 4+ 4+  Ankle plantarflexion 4+ 4+  Ankle inversion    Ankle eversion     (Blank rows = not tested)  LUMBAR SPECIAL TESTS:  Forward bending test: L PSIS moves at the same time with the R (-) Thigh thrust test L (-)  Compression test L (-) Distraction test on B (-) SLR on B  (-) Slump test on B  FUNCTIONAL TESTS:  2 minute walk test: 465 ft from 405 ft. Marland Kitchen   GAIT: Distance walked: 465 ft Assistive device utilized: None Level of assistance: Complete Independence Comments: no gait deviations seen  TODAY'S TREATMENT:                                                                                                                              DATE:  09/29/2022 Progress note done (FOTO, ROM, MMT, special tests, 2MWT) Patient education (see below)  09/24/22 Supine figure-of-4 piriformis stretch x 30" x 3 Seated hamstring stretch x 30" x 3 Seated marches, neutral spine x 10 x 2 Seated alt knee ext, neutral spine x 10 x 2 Standing abdominal isometrics with a physioball, neutral spine x 3" x 10 x 2 Standing hip abd x 10 x 2 x 2 lbs Standing hip ext x 10 x 2 x 2 lbs Standing heel/toe raises x 10 x 2 x 2 lbs Pallof press, RTB x 10 x 2  09/17/22 Hooklying hip adduction squeeze x 5" x 10 x 2 DL bridging with hip abd, GTB x 5" x 10 x 2 Sidelying clamshells x GTB x 10 x 2 Supine figure-of-4 piriformis stretch x 30" x 3 Seated hamstring stretch x 30" x 3 Seated trunk flex 3-way stretch with a physioball x 30" each direction Standing abdominal isometrics with a physioball, neutral spine x 3" x 10 x 2 Pallof press, RTB x 10 x 2  09/15/22 Hooklying hip adduction squeeze x 3" x 10 x 2 DL bridging with hip abd, RTB x 3" x 10 x 2 Sidelying clamshells x RTB x 10 x 2 Supine figure-of-4 piriformis stretch x 30" x 3 Seated hamstring stretch x 30" x 3 Seated trunk flex 3-way stretch with a physioball x 30" each direction Standing abdominal isometrics with a physioball x 3" x 10 x 2  09/09/22 Grade 2 inf L hip distraction (anchored on the middle thigh) x 30" x 3 followed by Grade 1 inf thrust on the last rep Hooklying hip adduction squeeze x 3" x 10 x 2 DL bridging with hip abd, RTB x 3" x 10 x 2 Sidelying  clamshells x RTB x 10 x 2 Supine figure-of-4 piriformis stretch x 30" x 3 Seated hamstring stretch x 30" x 3 Seated trunk flex 3-way stretch with a physioball x 30" each direction Standing abdominal isometrics with a physioball x 3" x 10 x 2  09/07/22 Lt LE bridge 2 x 8  RT single knee to chest only x 3 for 30" MET R hip flexion, left hip extension in supine STS with RTB at knees 2 x 10  Sidelying clam 1 x 15 LLE Step up 7 inch 2 x 10 bilateral   09/03/2022: SI checked in standing position : Rt iliac crest higher PSIS higher ASIS lower  Supine sit test:  supine Lt is shorter. Clearing of pubic area with hip isometric adduction/abduction and bridging followed by contract relax to properly align SI  Manual muscle techniques used to anteriorly rotate Lt SI with success after several attempts. Supine:  Lt LE only bridge x 5 RT single knee to chest only x 3 for 30" Clam X 5 B   09/01/22 Hooklying hip abduction isometric with belt at knees 10 x 10 second holds Hooklying hip adduction isometric with ball at knees  10 x 10 second holds Bridge 2 x 10  Standing hip abduction RTB at knees 2x 10  Standing hip extension RTB 2x 10  Lateral stepping RTB at ankles 6 x 6 feet bilateral   08/26/2022 Treatment 1) Nustep 5': #5BLE and #10 BUE- cues for pacing and proper movement patterns  2)Side stepping with RTB at wall; 88f x 10. Cues for reduced hip ER and proper alignment of hip movements.   3) Backwards walking along wall with RTB 156fx 5; cues for proper movement patterns and reduced compensations.  4)Monster walks with RTB 56f x 5; cues for proper movement patterns and reduced compensations.   5)Standing hip extension with RTG around ankles; 2 x 20 repetitions; Cues for reduced trunk compensations.   6) Single arm deadlifts from elevated mat in LUE. 2 x 10 repetitions with heavy cues and demonstration for proper form.  08/19/21 Evaluation and patient education done (including  self-management techniques)   PATIENT EDUCATION:  Education details: Review of goals. Educated on the reason for D/C. Educated on the importance of compliance to HEP.  Person educated: Patient Education method: Explanation and Demonstration Education comprehension: verbalized understanding  HOME EXERCISE PROGRAM: Access Code: [email or web address] - Seated March  - 1 x daily - 7 x weekly - 2 sets - 10 reps  09/09/22 - Seated Hamstring Stretch  - 1 x daily - 7 x weekly - 3 reps - 30 hold - Supine Piriformis Stretch with Foot on Ground  - 1 x daily - 7 x weekly - 3 reps - 30 hold  09/07/22 - Sit to Stand with Resistance Around Legs  - 1 x daily - 7 x weekly - 2 sets - 10 reps  2/15: Lt leg bridge only RT knee to chest  URL: [email or web address] 09/01/22 - Hooklying Isometric Hip Abduction with Belt  - 1 x daily - 7 x weekly - 10 reps - 10 second hold - Supine Hip Adduction Isometric with Ball  - 1 x daily - 7 x weekly - 10 reps - 10 second hold - Bridge  - 1 x daily - 7 x weekly - 2 sets - 10 reps - Side Stepping with Resistance at Ankles  - 1 x daily - 7 x weekly - 3 sets - 10 reps   Date: 08/26/2022 - Side Stepping with Resistance at Ankles and Counter Support  - 1 x daily - 7 x weekly - 3 sets - 10 reps - Standing 3-Way Leg Reach with Resistance at Ankles and Counter Support  - 1 x daily - 7 x weekly - 3 sets - 10 reps - Standing Hip Abduction with Resistance at Ankles and Counter Support  - 1 x daily - 7 x weekly - 3 sets - 10 repsNone provide to date  ASSESSMENT:  CLINICAL IMPRESSION: Patient demonstrated continued improvements in function as indicated by positive significant changes in FOTO, ROM, MMT, and 2MWT. Patient now denies pain. With this, skilled PT is not required at this moment. Patient is now D/C to indep HEP.  EVAL: Patient is a 82y.o. female who was seen today for physical therapy evaluation and treatment for sciatica on L side. Patient was diagnosed with L  side sciatica by referring provider further defined by difficulty with bed mobility due to pain, weakness, joint hypomobility, and decreased soft tissue extensibility. However, patient doesn't exhibit signs of neural tension as patient is negative for SLR and Slump test. Patient maybe potentially exhibiting signs of sacroiliac joint dysfunction (hypomobility) on the L side as patient is positive on the forward bending test, thigh thrust test, and compression test. In addition, patient may present a L pelvic upslip as the L ASIS and PSIS are higher in supine and standing respectively. Skilled PT is required to address the impairments and functional limitations listed below. Patient is concerned about her legs being swollen. PT messaged Dr. PPosey Prontovia Epic today about patient's concerns. Dr. PPosey Prontoresponded saying that the swelling is from the prednisone that was given.       OBJECTIVE IMPAIRMENTS:  impaired flexibility.   ACTIVITY LIMITATIONS:  none  PARTICIPATION LIMITATIONS:  none  PERSONAL FACTORS: Age are also affecting patient's functional outcome.   REHAB POTENTIAL: Good  CLINICAL DECISION MAKING: Stable/uncomplicated  EVALUATION COMPLEXITY: Low   GOALS: Goals reviewed with patient? Yes  SHORT TERM GOALS: Target date: 09/16/22  Ptwill demonstrate indep in HEP to facilitate carry-over of skilled services and improve functional outcomes Goal status: MET  2.  Pt will report decrease in pain to 2 to facilitate ease in ADLs Baseline: 3/10 Goal status: MET  3.  Pt will demonstrate improvement of lumbar ext by 25%to facilitate ease in ADLs  Baseline: 50% Goal status: MET  4.  Pt will demonstrate increase in LE strength to 4/5 to facilitate ease and safety in ambulation and ADLs Baseline: 3+/5 Goal status: MET  LONG TERM GOALS: Target date: 10/14/22  Pt will increase FOTO to at least 60 in order to demonstrate significant improvement in function related to ADLs Baseline: 39.75 Goal  status: MET  2.  Pt will be able to do bed mobility with little to no pain Goal status: MET PLAN:  PT FREQUENCY:  0  PT DURATION: other: 0  PLANNED INTERVENTIONS:  Skilled PT not indicated at this time. D/C to indep HEP .  Harvie Heck. Norell Brisbin, PT, DPT, OCS Board-Certified Clinical Specialist in [place] # ([place]): [id number] T 12:54 PM, 09/29/22

## 2022-10-01 ENCOUNTER — Encounter (HOSPITAL_COMMUNITY): Payer: Medicare PPO

## 2022-10-06 ENCOUNTER — Encounter (HOSPITAL_COMMUNITY): Payer: Medicare PPO

## 2022-10-07 DIAGNOSIS — Z1283 Encounter for screening for malignant neoplasm of skin: Secondary | ICD-10-CM | POA: Diagnosis not present

## 2022-10-07 DIAGNOSIS — D239 Other benign neoplasm of skin, unspecified: Secondary | ICD-10-CM | POA: Diagnosis not present

## 2022-10-07 DIAGNOSIS — L570 Actinic keratosis: Secondary | ICD-10-CM | POA: Diagnosis not present

## 2022-10-07 DIAGNOSIS — L280 Lichen simplex chronicus: Secondary | ICD-10-CM | POA: Diagnosis not present

## 2022-10-08 ENCOUNTER — Encounter (HOSPITAL_COMMUNITY): Payer: Medicare PPO

## 2022-11-12 DIAGNOSIS — N1831 Chronic kidney disease, stage 3a: Secondary | ICD-10-CM | POA: Diagnosis not present

## 2022-11-12 DIAGNOSIS — E039 Hypothyroidism, unspecified: Secondary | ICD-10-CM | POA: Diagnosis not present

## 2022-11-13 LAB — BASIC METABOLIC PANEL
BUN/Creatinine Ratio: 16 (ref 12–28)
BUN: 18 mg/dL (ref 8–27)
CO2: 24 mmol/L (ref 20–29)
Calcium: 9.2 mg/dL (ref 8.7–10.3)
Chloride: 103 mmol/L (ref 96–106)
Creatinine, Ser: 1.12 mg/dL — ABNORMAL HIGH (ref 0.57–1.00)
Glucose: 89 mg/dL (ref 70–99)
Potassium: 5.4 mmol/L — ABNORMAL HIGH (ref 3.5–5.2)
Sodium: 142 mmol/L (ref 134–144)
eGFR: 50 mL/min/{1.73_m2} — ABNORMAL LOW (ref >59)

## 2022-11-13 LAB — TSH+FREE T4
Free T4: 1.59 ng/dL (ref 0.82–1.77)
TSH: 2.3 u[IU]/mL (ref 0.450–4.500)

## 2022-11-20 ENCOUNTER — Encounter: Payer: Self-pay | Admitting: Internal Medicine

## 2022-11-20 ENCOUNTER — Ambulatory Visit: Payer: Medicare PPO | Admitting: Internal Medicine

## 2022-11-20 VITALS — BP 110/69 | HR 57 | Ht 63.0 in | Wt 181.8 lb

## 2022-11-20 DIAGNOSIS — M5432 Sciatica, left side: Secondary | ICD-10-CM | POA: Diagnosis not present

## 2022-11-20 DIAGNOSIS — N1831 Chronic kidney disease, stage 3a: Secondary | ICD-10-CM

## 2022-11-20 DIAGNOSIS — R519 Headache, unspecified: Secondary | ICD-10-CM | POA: Insufficient documentation

## 2022-11-20 DIAGNOSIS — I10 Essential (primary) hypertension: Secondary | ICD-10-CM

## 2022-11-20 DIAGNOSIS — G5603 Carpal tunnel syndrome, bilateral upper limbs: Secondary | ICD-10-CM

## 2022-11-20 DIAGNOSIS — E039 Hypothyroidism, unspecified: Secondary | ICD-10-CM | POA: Diagnosis not present

## 2022-11-20 MED ORDER — LEVOTHYROXINE SODIUM 88 MCG PO TABS
88.0000 ug | ORAL_TABLET | Freq: Every day | ORAL | 1 refills | Status: DC
Start: 2022-11-20 — End: 2023-07-12

## 2022-11-20 MED ORDER — LISINOPRIL 10 MG PO TABS: 10.0000 mg | ORAL_TABLET | Freq: Every day | ORAL | 1 refills | Status: DC

## 2022-11-20 NOTE — Assessment & Plan Note (Signed)
Her symptoms are concerning for trigeminal neuralgia Offered neurology referral, but she prefers to wait for now as her symptoms are not debilitating

## 2022-11-20 NOTE — Assessment & Plan Note (Addendum)
Likely due to inadequate fluid intake and/or age related decline Has h/o horseshoe kidney On ACEi Avoid nephrotoxic agents Advised to increase fluid intake Checked BMP, showed hyperkalemia - likely due to potassium rich food in addition to lisinopril, advised to avoid potassium rich food in excess

## 2022-11-20 NOTE — Assessment & Plan Note (Signed)
Advised to use wrist brace for now 

## 2022-11-20 NOTE — Assessment & Plan Note (Signed)
Left-sided low back pain with radiating symptoms Tylenol as needed for pain Flexeril as needed for muscle spasms Avoid heavy lifting and frequent bending Referred to PT - has noticed improvement in back and hip pain If persistent pain, will get imaging

## 2022-11-20 NOTE — Progress Notes (Signed)
Established Patient Office Visit  Subjective:  Patient ID: [name], female    DOB: [birth year]  Age: 82 y.o. MRN: [id number]  CC:  Chief Complaint  Patient presents with   Hypothyroidism    Four month follow up. Patient feels real weak and off balance due to hip pain. She has ankles swelling.    HPI [name] is a 82 y.o. female with past medical history of HTN, hypothyroidism and vitamin D deficiency who presents for f/u of her chronic medical conditions.  Hypothyroidism: She has started taking levothyroxine 88 mcg.  Her thyroid function tests are within limits now.  Her TSH was still elevated in the last blood tests, as we had tried to lower dose of thyroid hormone as it was oversupplemented in the past.  She had gained 7 pounds in the last visit, but has been stable since increasing dose of levothyroxine to 88 mcg daily. She does report fatigue and feeling sluggish in the morning. Of note, her jitteriness/anxiety and palpitations have resolved now since switching from NP thyroid.  Her BMP showed GFR of 50, worse compared to prior-59.  She denies any dysuria, hematuria, urinary hesitance or resistance.  She agrees to improve fluid intake.  She reports bilateral ankle swelling, but has no pitting edema today.  She has dyspnea on heavy exertion, but denies any orthopnea or PND currently.  She reports feeling weak, but attributes it to her hip pain.  She has noticed improvement with physical therapy.  She also reports intermittent episodes of right side temporal area headache, which is electric shocklike sensation, lasting for few seconds.  She has about 1 episode on an average daily.  Denies any dizziness, visual disturbance or other focal neurologic deficits during the headache episodes.  She prefers to avoid any medicine or investigation for now.   Past Medical History:  Diagnosis Date   Allergy    Anemia    prior to hysterectomy   Back pain    comepensating for knee pain  per pt   Blood transfusion without reported diagnosis    Cancer (HCC)    Cataract    right eye and immature   Complication of anesthesia    Constipation    takes Colace daily and Milk of Mag   CTS (carpal tunnel syndrome) 08/31/2016   Dry eyes    uses Eye Drops daily as needed   Female hypogonadism syndrome    GERD (gastroesophageal reflux disease)    History of colon polyps    Hyperlipidemia    was on meds but stopped taking back in June 2015   Hypertension    takes Lisinopril daily   Hypothyroidism    takes Synthroid daily   Obesity (BMI 30.0-34.9)    Peripheral arterial disease (HCC)    Primary localized osteoarthritis of left knee 04/17/2015   Primary localized osteoarthritis of right knee    knees   Stage 3a chronic kidney disease (HCC) 10/14/2021    Past Surgical History:  Procedure Laterality Date   ABDOMINAL HYSTERECTOMY  90's   complete   APPENDECTOMY  80's   BACK SURGERY     CATARACT EXTRACTION W/PHACO Right 12/23/2015   Procedure: CATARACT EXTRACTION PHACO AND INTRAOCULAR LENS PLACEMENT RIGHT EYE CDE=6.42;  Surgeon: Susa Simmonds, MD;  Location: [facility];  Service: Ophthalmology;  Laterality: Right;   CATARACT EXTRACTION W/PHACO Left 05/11/2022   Procedure: CATARACT EXTRACTION PHACO AND INTRAOCULAR LENS PLACEMENT (IOC);  Surgeon: Fabio Pierce, MD;  Location:  [facility];  Service: Ophthalmology;  Laterality: Left;  CDE 10.71   COLONOSCOPY  09/09/2004   [id number] rectum and colon   COLONOSCOPY N/A 11/08/2013   Procedure: COLONOSCOPY;  Surgeon: Corbin Ade, MD;  Location: [facility];  Service: Endoscopy;  Laterality: N/A;  10:45   COLONOSCOPY WITH PROPOFOL N/A 06/30/2021   Surgeon: Corbin Ade, MD; diverticulosis in sigmoid, descending, transverse colon, nonbleeding internal hemorrhoids, otherwise normal exam.  No repeat due to age.   JOINT REPLACEMENT     2015, 2016   NECK SURGERY  1983   RECTOCELE REPAIR  10/2007   SPINE SURGERY     ruptured disc    TOTAL KNEE ARTHROPLASTY Right 04/04/2014   DR Thurston Hole   TOTAL KNEE ARTHROPLASTY Right 04/04/2014   Procedure: RIGHT TOTAL KNEE ARTHROPLASTY;  Surgeon: Nilda Simmer, MD;  Location: [facility] OR;  Service: Orthopedics;  Laterality: Right;   TOTAL KNEE ARTHROPLASTY Left 04/29/2015   Procedure: TOTAL KNEE ARTHROPLASTY;  Surgeon: Salvatore Marvel, MD;  Location: [facility] OR;  Service: Orthopedics;  Laterality: Left;   TUBAL LIGATION      Family History  Problem Relation Age of Onset   Colon cancer Sister        age 58s   Myasthenia gravis Sister    Bladder Cancer Brother    Lung cancer Brother        age 28   Alzheimer's disease Mother    COPD Father    Asthma Father    Alcohol abuse Brother        likely died of pneumonia    Social History   Socioeconomic History   Marital status: Widowed    Spouse name: Not on file   Number of children: 2   Years of education: 60   Highest education level: Not on file  Occupational History   Occupation: retired    Comment: Hotel manager  Tobacco Use   Smoking status: Never   Smokeless tobacco: Never  Building services engineer Use: Never used  Substance and Sexual Activity   Alcohol use: No   Drug use: No   Sexual activity: Never    Birth control/protection: Surgical  Other Topics Concern   Not on file  Social History Narrative   Husband died after over 40 years and then a year after husband died, she met a man who she had a relationship with for over ten years. He died in 2017-09-30 after long illness.   Live alone.    Has two children, grown sons.    Gets out with neices, enjoys going out to eat, going to [employer].   Goes to church.    Eats all food groups, eats some meat.       Social Determinants of Health   Financial Resource Strain: Low Risk  (06/02/2022)   Overall Financial Resource Strain (CARDIA)    Difficulty of Paying Living Expenses: Not hard at all  Food Insecurity: No Food Insecurity (06/02/2022)   Hunger Vital Sign     Worried About Running Out of Food in the Last Year: Never true    Ran Out of Food in the Last Year: Never true  Transportation Needs: No Transportation Needs (06/02/2022)   PRAPARE - Administrator, Civil Service (Medical): No    Lack of Transportation (Non-Medical): No  Physical Activity: Inactive (05/21/2021)   Exercise Vital Sign    Days of Exercise per Week: 0 days    Minutes of  Exercise per Session: 0 min  Stress: No Stress Concern Present (06/02/2022)   [employer] of Occupational Health - Occupational Stress Questionnaire    Feeling of Stress : Not at all  Social Connections: Moderately Isolated (06/02/2022)   Social Connection and Isolation Panel [NHANES]    Frequency of Communication with Friends and Family: More than three times a week    Frequency of Social Gatherings with Friends and Family: More than three times a week    Attends Religious Services: More than 4 times per year    Active Member of [employer] or Organizations: No    Attends Banker Meetings: Never    Marital Status: Widowed  Intimate Partner Violence: Not At Risk (05/21/2021)   Humiliation, Afraid, Rape, and Kick questionnaire    Fear of Current or Ex-Partner: No    Emotionally Abused: No    Physically Abused: No    Sexually Abused: No    Outpatient Medications Prior to Visit  Medication Sig Dispense Refill   acetaminophen (TYLENOL) 500 MG tablet Take 500 mg by mouth 2 (two) times daily as needed for moderate pain.     aspirin EC 81 MG tablet Take 81 mg by mouth daily.     b complex vitamins capsule Take 1 capsule by mouth daily.     Cholecalciferol (VITAMIN D) 50 MCG (2000 UT) tablet Take 2,000 Units by mouth daily.     magnesium hydroxide (MILK OF MAGNESIA) 400 MG/5ML suspension Take 15 mLs by mouth daily as needed for moderate constipation.     polyethylene glycol powder (GLYCOLAX/MIRALAX) 17 GM/SCOOP powder Take 1 Container by mouth once. As needed     tizanidine (ZANAFLEX) 2  MG capsule Take 1 capsule (2 mg total) by mouth 3 (three) times daily as needed for muscle spasms. Do not drink alcohol or drive while taking this medication. May cause drowsiness 15 capsule 0   levothyroxine (SYNTHROID) 88 MCG tablet Take 1 tablet (88 mcg total) by mouth daily before breakfast. 90 tablet 1   lisinopril (ZESTRIL) 10 MG tablet TAKE ONE TABLET (10MG  TOTAL) BY MOUTH DAILY 90 tablet 0   predniSONE (DELTASONE) 20 MG tablet Take 2 tablets (40 mg total) by mouth daily with breakfast. 10 tablet 0   No facility-administered medications prior to visit.    Allergies  Allergen Reactions   Codeine Nausea And Vomiting   Crestor [Rosuvastatin] Other (See Comments)    Body aches   Flexall [Menthol (Topical Analgesic)] Rash   Penicillins Rash    ROS Review of Systems  Constitutional:  Positive for fatigue. Negative for chills and fever.  HENT:  Negative for congestion, sinus pressure, sinus pain and sore throat.   Eyes:  Negative for pain and discharge.  Respiratory:  Negative for cough and shortness of breath.   Cardiovascular:  Negative for chest pain and palpitations.  Gastrointestinal:  Negative for abdominal pain, diarrhea, nausea and vomiting.  Endocrine: Negative for polydipsia and polyuria.  Genitourinary:  Negative for dysuria and hematuria.  Musculoskeletal:  Positive for arthralgias and back pain. Negative for neck pain and neck stiffness.  Skin:  Negative for rash.  Neurological:  Positive for weakness, numbness and headaches. Negative for dizziness.  Psychiatric/Behavioral:  Negative for agitation and behavioral problems. The patient is not nervous/anxious.       Objective:    Physical Exam Vitals reviewed.  Constitutional:      General: She is not in acute distress.    Appearance: She is not diaphoretic.  HENT:     Head: Normocephalic and atraumatic.     Nose: Nose normal.     Mouth/Throat:     Mouth: Mucous membranes are moist.  Eyes:     General: No  scleral icterus.    Extraocular Movements: Extraocular movements intact.  Cardiovascular:     Rate and Rhythm: Normal rate and regular rhythm.     Pulses: Normal pulses.     Heart sounds: Normal heart sounds. No murmur heard. Pulmonary:     Breath sounds: Normal breath sounds. No wheezing or rales.  Musculoskeletal:     Cervical back: Neck supple. No tenderness.     Right lower leg: No edema.     Left lower leg: No edema.  Skin:    General: Skin is warm.     Findings: No rash.  Neurological:     General: No focal deficit present.     Mental Status: She is alert and oriented to person, place, and time.     Sensory: No sensory deficit.     Motor: No weakness.  Psychiatric:        Mood and Affect: Mood normal.        Behavior: Behavior normal.     BP 110/69 (BP Location: Left Arm, Patient Position: Sitting, Cuff Size: Normal)   Pulse (!) 57   Ht 5\' 3"  (1.6 m)   Wt 181 lb 12.8 oz (82.5 kg)   LMP 07/20/1990 (Approximate)   SpO2 94%   BMI 32.20 kg/m  Wt Readings from Last 3 Encounters:  11/20/22 181 lb 12.8 oz (82.5 kg)  08/12/22 181 lb 3.2 oz (82.2 kg)  07/21/22 178 lb 9.6 oz (81 kg)    Lab Results  Component Value Date   TSH 2.300 11/12/2022   Lab Results  Component Value Date   WBC 6.0 07/15/2022   HGB 12.8 07/15/2022   HCT 38.8 07/15/2022   MCV 88 07/15/2022   PLT 207 07/15/2022   Lab Results  Component Value Date   NA 142 11/12/2022   K 5.4 (H) 11/12/2022   CO2 24 11/12/2022   GLUCOSE 89 11/12/2022   BUN 18 11/12/2022   CREATININE 1.12 (H) 11/12/2022   BILITOT 0.3 07/15/2022   ALKPHOS 74 07/15/2022   AST 17 07/15/2022   ALT 12 07/15/2022   PROT 6.0 07/15/2022   ALBUMIN 4.0 07/15/2022   CALCIUM 9.2 11/12/2022   ANIONGAP 13 05/01/2015   EGFR 50 (L) 11/12/2022   Lab Results  Component Value Date   CHOL 210 (H) 07/15/2022   Lab Results  Component Value Date   HDL 46 07/15/2022   Lab Results  Component Value Date   LDLCALC 141 (H) 07/15/2022    Lab Results  Component Value Date   TRIG 127 07/15/2022   Lab Results  Component Value Date   CHOLHDL 4.6 (H) 07/15/2022   Lab Results  Component Value Date   HGBA1C 5.5 07/15/2022      Assessment & Plan:   Problem List Items Addressed This Visit       Cardiovascular and Mediastinum   Hypertension - Primary    BP Readings from Last 1 Encounters:  11/20/22 110/69  Well-controlled with lisinopril Counseled for compliance with the medications Advised DASH diet and moderate exercise/walking, at least 150 mins/week      Relevant Medications   lisinopril (ZESTRIL) 10 MG tablet     Endocrine   Hypothyroidism    Lab Results  Component Value Date  TSH 2.300 11/12/2022  On levothyroxine 88 mcg QD Checked TSH and free T4 - wnl now      Relevant Medications   levothyroxine (SYNTHROID) 88 MCG tablet   Other Relevant Orders   TSH + free T4     Nervous and Auditory   CTS (carpal tunnel syndrome)    Advised to use wrist brace for now      Sciatica of left side    Left-sided low back pain with radiating symptoms Tylenol as needed for pain Flexeril as needed for muscle spasms Avoid heavy lifting and frequent bending Referred to PT - has noticed improvement in back and hip pain If persistent pain, will get imaging        Genitourinary   Stage 3a chronic kidney disease (HCC)    Likely due to inadequate fluid intake and/or age related decline Has h/o horseshoe kidney On ACEi Avoid nephrotoxic agents Advised to increase fluid intake Checked BMP, showed hyperkalemia - likely due to potassium rich food in addition to lisinopril, advised to avoid potassium rich food in excess      Relevant Orders   Basic Metabolic Panel (BMET)     Other   Right sided temporal headache    Her symptoms are concerning for trigeminal neuralgia Offered neurology referral, but she prefers to wait for now as her symptoms are not debilitating       Meds ordered this encounter   Medications   lisinopril (ZESTRIL) 10 MG tablet    Sig: Take 1 tablet (10 mg total) by mouth daily.    Dispense:  90 tablet    Refill:  1   levothyroxine (SYNTHROID) 88 MCG tablet    Sig: Take 1 tablet (88 mcg total) by mouth daily before breakfast.    Dispense:  90 tablet    Refill:  1    Dose change    Follow-up: Return in about 4 months (around 03/23/2023) for Hypothyroidism and HTN.    Anabel Halon, MD

## 2022-11-20 NOTE — Assessment & Plan Note (Addendum)
BP Readings from Last 1 Encounters:  11/20/22 110/69   Well-controlled with lisinopril Counseled for compliance with the medications Advised DASH diet and moderate exercise/walking, at least 150 mins/week

## 2022-11-20 NOTE — Patient Instructions (Signed)
Please continue to take medications as prescribed.  Please continue to follow low carb diet and perform moderate exercise/walking at least 150 mins/week.  Please wear wrist brace at nighttime.  Please apply Voltaren gel for small hand joint pain.

## 2022-11-20 NOTE — Assessment & Plan Note (Signed)
Lab Results  Component Value Date   TSH 2.300 11/12/2022   On levothyroxine 88 mcg QD Checked TSH and free T4 - wnl now

## 2022-12-17 ENCOUNTER — Encounter: Payer: Self-pay | Admitting: Internal Medicine

## 2023-01-11 ENCOUNTER — Ambulatory Visit: Payer: Medicare PPO | Admitting: Family Medicine

## 2023-01-11 ENCOUNTER — Encounter: Payer: Self-pay | Admitting: Family Medicine

## 2023-01-11 VITALS — BP 103/53 | HR 57 | Temp 97.4°F | Ht 63.0 in | Wt 178.4 lb

## 2023-01-11 DIAGNOSIS — M79605 Pain in left leg: Secondary | ICD-10-CM | POA: Diagnosis not present

## 2023-01-11 DIAGNOSIS — M7989 Other specified soft tissue disorders: Secondary | ICD-10-CM | POA: Diagnosis not present

## 2023-01-11 DIAGNOSIS — M79604 Pain in right leg: Secondary | ICD-10-CM | POA: Diagnosis not present

## 2023-01-11 MED ORDER — FUROSEMIDE 20 MG PO TABS
20.0000 mg | ORAL_TABLET | Freq: Once | ORAL | 3 refills | Status: AC | PRN
Start: 2023-01-11 — End: ?

## 2023-01-11 NOTE — Assessment & Plan Note (Signed)
Bilateral leg swelling noted  Leg discomfort on dorsiflexion the left foot Lasix 20 mg PRN Edema vs DVT Ordered US Venous Doppler STAT- Awaiting results will treat accordingly.  Advise patient to visit ED if notice any new alarming symptoms such as chest pain, shortness of breath, headaches, or dizziness. Discussed do not stand or sit in on spot for a long time, Avoid tight clothing that restricts blood flow in your legs. Increase fluid intake with water to avoid dehydration .

## 2023-01-11 NOTE — Patient Instructions (Signed)
        Great to see you today.   - Please take medications as prescribed. - Follow up with your primary health provider if any health concerns arises. - If symptoms worsen please contact your primary care provider and/or visit the emergency department.  

## 2023-01-11 NOTE — Progress Notes (Signed)
Patient Office Visit   Subjective   Patient ID: [name], female    DOB: [birth year]  Age: 82 y.o. MRN: [id number]  CC:  Chief Complaint  Patient presents with   Follow-up    severe feet pain/swelling Allena Katz patient)    HPI [name] 82 year old female,presents to the clinic for bilateral leg swelling pain started 2 months ago and worsening since onset.She  has a past medical history of Allergy, Anemia, Back pain, Blood transfusion without reported diagnosis, Cancer (HCC), Cataract, Complication of anesthesia, Constipation, CTS (carpal tunnel syndrome) (08/31/2016), Dry eyes, Female hypogonadism syndrome, GERD (gastroesophageal reflux disease), History of colon polyps, Hyperlipidemia, Hypertension, Hypothyroidism, Obesity (BMI 30.0-34.9), Peripheral arterial disease (HCC), Primary localized osteoarthritis of left knee (04/17/2015), Primary localized osteoarthritis of right knee, and Stage 3a chronic kidney disease (HCC) (10/14/2021).  Bilateral Leg Pain  There was no injury mechanism.The pain is present in the left ankle and right ankle. The quality of the pain is described as shooting and aching. The pain is at a severity of 8/10. The pain has been fluctuating since onset. Pertinent negatives include no loss of motion, loss of sensation, numbness or tingling. She reports no foreign bodies present. The symptoms are aggravated by movement and weight bearing. She has tried nothing for the symptoms.       Outpatient Encounter Medications as of 01/11/2023  Medication Sig   aspirin EC 81 MG tablet Take 81 mg by mouth daily.   b complex vitamins capsule Take 1 capsule by mouth daily.   Cholecalciferol (VITAMIN D) 50 MCG (2000 UT) tablet Take 2,000 Units by mouth daily.   furosemide (LASIX) 20 MG tablet Take 1 tablet (20 mg total) by mouth once as needed for up to 1 dose.   levothyroxine (SYNTHROID) 88 MCG tablet Take 1 tablet (88 mcg total) by mouth daily before breakfast.    lisinopril (ZESTRIL) 10 MG tablet Take 1 tablet (10 mg total) by mouth daily.   magnesium hydroxide (MILK OF MAGNESIA) 400 MG/5ML suspension Take 15 mLs by mouth daily as needed for moderate constipation.   polyethylene glycol powder (GLYCOLAX/MIRALAX) 17 GM/SCOOP powder Take 1 Container by mouth once. As needed   tizanidine (ZANAFLEX) 2 MG capsule Take 1 capsule (2 mg total) by mouth 3 (three) times daily as needed for muscle spasms. Do not drink alcohol or drive while taking this medication. May cause drowsiness   acetaminophen (TYLENOL) 500 MG tablet Take 500 mg by mouth 2 (two) times daily as needed for moderate pain. (Patient not taking: Reported on 01/11/2023)   No facility-administered encounter medications on file as of 01/11/2023.    Past Surgical History:  Procedure Laterality Date   ABDOMINAL HYSTERECTOMY  90's   complete   APPENDECTOMY  80's   BACK SURGERY     CATARACT EXTRACTION W/PHACO Right 12/23/2015   Procedure: CATARACT EXTRACTION PHACO AND INTRAOCULAR LENS PLACEMENT RIGHT EYE CDE=6.42;  Surgeon: Susa Simmonds, MD;  Location: [facility];  Service: Ophthalmology;  Laterality: Right;   CATARACT EXTRACTION W/PHACO Left 05/11/2022   Procedure: CATARACT EXTRACTION PHACO AND INTRAOCULAR LENS PLACEMENT (IOC);  Surgeon: Fabio Pierce, MD;  Location: [facility];  Service: Ophthalmology;  Laterality: Left;  CDE 10.71   COLONOSCOPY  09/09/2004   [id number] rectum and colon   COLONOSCOPY N/A 11/08/2013   Procedure: COLONOSCOPY;  Surgeon: Corbin Ade, MD;  Location: [facility];  Service: Endoscopy;  Laterality: N/A;  10:45   COLONOSCOPY WITH PROPOFOL N/A  06/30/2021   Surgeon: Corbin Ade, MD; diverticulosis in sigmoid, descending, transverse colon, nonbleeding internal hemorrhoids, otherwise normal exam.  No repeat due to age.   JOINT REPLACEMENT     2015, 2016   NECK SURGERY  1983   RECTOCELE REPAIR  10/2007   SPINE SURGERY     ruptured disc   TOTAL KNEE ARTHROPLASTY Right  04/04/2014   DR Thurston Hole   TOTAL KNEE ARTHROPLASTY Right 04/04/2014   Procedure: RIGHT TOTAL KNEE ARTHROPLASTY;  Surgeon: Nilda Simmer, MD;  Location: [facility] OR;  Service: Orthopedics;  Laterality: Right;   TOTAL KNEE ARTHROPLASTY Left 04/29/2015   Procedure: TOTAL KNEE ARTHROPLASTY;  Surgeon: Salvatore Marvel, MD;  Location: [facility] OR;  Service: Orthopedics;  Laterality: Left;   TUBAL LIGATION      Review of Systems  Constitutional:  Negative for chills and fever.  Eyes:  Negative for blurred vision.  Cardiovascular:  Positive for chest pain.  Gastrointestinal:  Negative for abdominal pain.  Genitourinary:  Negative for dysuria.  Musculoskeletal:  Positive for myalgias.  Neurological:  Negative for dizziness and headaches.      Objective    BP (!) 103/53   Pulse (!) 57   Temp (!) 97.4 F (36.3 C) (Oral)   Ht 5\' 3"  (1.6 m)   Wt 178 lb 6.4 oz (80.9 kg)   LMP 07/20/1990 (Approximate)   SpO2 94%   BMI 31.60 kg/m   Physical Exam Vitals reviewed.  Constitutional:      General: She is not in acute distress.    Appearance: Normal appearance. She is not ill-appearing, toxic-appearing or diaphoretic.  HENT:     Head: Normocephalic.  Eyes:     General:        Right eye: No discharge.        Left eye: No discharge.     Conjunctiva/sclera: Conjunctivae normal.  Cardiovascular:     Rate and Rhythm: Normal rate.     Pulses: Normal pulses.     Heart sounds: Normal heart sounds.  Pulmonary:     Effort: Pulmonary effort is normal. No respiratory distress.     Breath sounds: Normal breath sounds.  Abdominal:     General: Bowel sounds are normal.     Palpations: Abdomen is soft.     Tenderness: There is no abdominal tenderness. There is no left CVA tenderness or guarding.  Musculoskeletal:        General: Tenderness present.     Cervical back: Normal range of motion.     Right lower leg: Edema present.     Left lower leg: Edema present.  Skin:    General: Skin is warm and dry.      Capillary Refill: Capillary refill takes less than 2 seconds.  Neurological:     General: No focal deficit present.     Mental Status: She is alert and oriented to person, place, and time.     Coordination: Coordination normal.     Gait: Gait normal.  Psychiatric:        Mood and Affect: Mood normal.        Behavior: Behavior normal.       Assessment & Plan:  Bilateral leg pain Assessment & Plan: Bilateral leg swelling noted  Leg discomfort on dorsiflexion the left foot Lasix 20 mg PRN Edema vs DVT Ordered US Venous Doppler STAT- Awaiting results will treat accordingly.  Advise patient to visit ED if notice any new alarming symptoms such as chest pain,  shortness of breath, headaches, or dizziness. Discussed do not stand or sit in on spot for a long time, Avoid tight clothing that restricts blood flow in your legs. Increase fluid intake with water to avoid dehydration .    Pain in both lower extremities -     VAS [place] LOWER EXTREMITY VENOUS (DVT); Future  Leg swelling -     Furosemide; Take 1 tablet (20 mg total) by mouth once as needed for up to 1 dose.  Dispense: 30 tablet; Refill: 3    Return if symptoms worsen or fail to improve.   Cruzita Lederer Newman Nip, FNP

## 2023-01-12 ENCOUNTER — Ambulatory Visit (HOSPITAL_COMMUNITY)
Admission: RE | Admit: 2023-01-12 | Discharge: 2023-01-12 | Disposition: A | Discharge status: Home / Self Care | Payer: Medicare PPO | Source: Ambulatory Visit | Attending: Family Medicine | Admitting: Family Medicine

## 2023-01-12 DIAGNOSIS — M79605 Pain in left leg: Secondary | ICD-10-CM | POA: Diagnosis not present

## 2023-01-12 DIAGNOSIS — R600 Localized edema: Secondary | ICD-10-CM | POA: Diagnosis not present

## 2023-01-12 DIAGNOSIS — M79604 Pain in right leg: Secondary | ICD-10-CM | POA: Insufficient documentation

## 2023-01-12 NOTE — Progress Notes (Signed)
Please inform patient,  Ultrasound results showed No evidence of deep venous thrombosis in either lower extremity.

## 2023-03-11 DIAGNOSIS — N1831 Chronic kidney disease, stage 3a: Secondary | ICD-10-CM | POA: Diagnosis not present

## 2023-03-11 DIAGNOSIS — E039 Hypothyroidism, unspecified: Secondary | ICD-10-CM | POA: Diagnosis not present

## 2023-03-12 LAB — BASIC METABOLIC PANEL
BUN: 19 mg/dL (ref 8–27)
Calcium: 9.8 mg/dL (ref 8.7–10.3)

## 2023-03-19 ENCOUNTER — Encounter: Payer: Self-pay | Admitting: Internal Medicine

## 2023-03-19 ENCOUNTER — Ambulatory Visit: Payer: Medicare PPO | Admitting: Internal Medicine

## 2023-03-19 VITALS — BP 115/65 | HR 57 | Ht 63.0 in | Wt 179.4 lb

## 2023-03-19 DIAGNOSIS — E039 Hypothyroidism, unspecified: Secondary | ICD-10-CM

## 2023-03-19 DIAGNOSIS — N1831 Chronic kidney disease, stage 3a: Secondary | ICD-10-CM | POA: Diagnosis not present

## 2023-03-19 DIAGNOSIS — E782 Mixed hyperlipidemia: Secondary | ICD-10-CM

## 2023-03-19 DIAGNOSIS — R739 Hyperglycemia, unspecified: Secondary | ICD-10-CM | POA: Diagnosis not present

## 2023-03-19 DIAGNOSIS — M7989 Other specified soft tissue disorders: Secondary | ICD-10-CM | POA: Diagnosis not present

## 2023-03-19 DIAGNOSIS — I10 Essential (primary) hypertension: Secondary | ICD-10-CM | POA: Diagnosis not present

## 2023-03-19 MED ORDER — LISINOPRIL 10 MG PO TABS
5.0000 mg | ORAL_TABLET | Freq: Every day | ORAL | Status: DC
Start: 2023-03-19 — End: 2023-07-23

## 2023-03-19 NOTE — Assessment & Plan Note (Signed)
BP Readings from Last 1 Encounters:  03/19/23 115/65   Well-controlled with lisinopril 10 mg QD Considering very tightly controlled blood pressure and episodes of dizziness, advised to take only half tablet of lisinopril QD for now Counseled for compliance with the medications Advised DASH diet and moderate exercise/walking, at least 150 mins/week

## 2023-03-19 NOTE — Assessment & Plan Note (Signed)
Likely due to chronic venous insufficiency Leg elevation and compression socks advised She has Lasix, but  advised her to use it only as needed for resistant leg swelling

## 2023-03-19 NOTE — Assessment & Plan Note (Addendum)
Lab Results  Component Value Date   TSH 3.240 03/11/2023   On levothyroxine 88 mcg QD Checked TSH and free T4 - wnl now Her fatigue is likely due to relatively normal thyroid profile, she used to have oversupplemented thyroid profile with NP thyroid

## 2023-03-19 NOTE — Progress Notes (Signed)
Established Patient Office Visit  Subjective:  Patient ID: [name], female    DOB: 02/12/1941  Age: 82 y.o. MRN: [id number]  CC:  Chief Complaint  Patient presents with   Follow-up    Follow up feet swelling weak feeling almost like brain fog     HPI [name] is a 82 y.o. female with past medical history of HTN, hypothyroidism and vitamin D deficiency who presents for f/u of her chronic medical conditions.  HTN: Her BP is tightly controlled currently.  She has noticed SBP in 100s at times when she has dizziness.  She denies any headache, chest pain or palpitations.  Hypothyroidism: She has been taking levothyroxine 88 mcg.  Her thyroid function tests are within limits now.  Denies any recent change in weight or appetite.  She does report fatigue and feeling sluggish in the morning since switching from NP thyroid.  She used to have oversupplemented thyroid profile in the past.  Of note, her jitteriness/anxiety and palpitations have resolved now since switching from NP thyroid.  Her BMP showed GFR of 55, better compared to prior-50.  She denies any dysuria, hematuria, urinary hesitance or resistance.  She is trying to improve fluid intake.  She reports bilateral ankle swelling, but has no pitting edema today.  She has dyspnea on heavy exertion, but denies any orthopnea or PND currently.  She reports feeling weak, but attributes it to her hip pain.  She has noticed improvement with physical therapy.  She was given Lasix for leg swelling, which she has taken only 3 times since 06/24.   Past Medical History:  Diagnosis Date   Allergy    Anemia    prior to hysterectomy   Back pain    comepensating for knee pain per pt   Blood transfusion without reported diagnosis    Cancer (HCC)    Cataract    right eye and immature   Complication of anesthesia    Constipation    takes Colace daily and Milk of Mag   CTS (carpal tunnel syndrome) 08/31/2016   Dry eyes    uses Eye Drops  daily as needed   Female hypogonadism syndrome    GERD (gastroesophageal reflux disease)    History of colon polyps    Hyperlipidemia    was on meds but stopped taking back in June 2015   Hypertension    takes Lisinopril daily   Hypothyroidism    takes Synthroid daily   Obesity (BMI 30.0-34.9)    Peripheral arterial disease (HCC)    Primary localized osteoarthritis of left knee 04/17/2015   Primary localized osteoarthritis of right knee    knees   Stage 3a chronic kidney disease (HCC) 10/14/2021    Past Surgical History:  Procedure Laterality Date   ABDOMINAL HYSTERECTOMY  90's   complete   APPENDECTOMY  80's   BACK SURGERY     CATARACT EXTRACTION W/PHACO Right 12/23/2015   Procedure: CATARACT EXTRACTION PHACO AND INTRAOCULAR LENS PLACEMENT RIGHT EYE CDE=6.42;  Surgeon: Susa Simmonds, MD;  Location: [facility];  Service: Ophthalmology;  Laterality: Right;   CATARACT EXTRACTION W/PHACO Left 05/11/2022   Procedure: CATARACT EXTRACTION PHACO AND INTRAOCULAR LENS PLACEMENT (IOC);  Surgeon: Fabio Pierce, MD;  Location: [facility];  Service: Ophthalmology;  Laterality: Left;  CDE 10.71   COLONOSCOPY  09/09/2004   WUJ:WJXBJY rectum and colon   COLONOSCOPY N/A 11/08/2013   Procedure: COLONOSCOPY;  Surgeon: Corbin Ade, MD;  Location: [facility];  Service: Endoscopy;  Laterality: N/A;  10:45   COLONOSCOPY WITH PROPOFOL N/A 06/30/2021   Surgeon: Corbin Ade, MD; diverticulosis in sigmoid, descending, transverse colon, nonbleeding internal hemorrhoids, otherwise normal exam.  No repeat due to age.   JOINT REPLACEMENT     2015, 2016   NECK SURGERY  1983   RECTOCELE REPAIR  10/2007   SPINE SURGERY     ruptured disc   TOTAL KNEE ARTHROPLASTY Right 04/04/2014   DR Thurston Hole   TOTAL KNEE ARTHROPLASTY Right 04/04/2014   Procedure: RIGHT TOTAL KNEE ARTHROPLASTY;  Surgeon: Nilda Simmer, MD;  Location: [facility] OR;  Service: Orthopedics;  Laterality: Right;   TOTAL KNEE ARTHROPLASTY Left  04/29/2015   Procedure: TOTAL KNEE ARTHROPLASTY;  Surgeon: Salvatore Marvel, MD;  Location: [facility] OR;  Service: Orthopedics;  Laterality: Left;   TUBAL LIGATION      Family History  Problem Relation Age of Onset   Colon cancer Sister        age 81s   Myasthenia gravis Sister    Bladder Cancer Brother    Lung cancer Brother        age 48   Alzheimer's disease Mother    COPD Father    Asthma Father    Alcohol abuse Brother        likely died of pneumonia    Social History   Socioeconomic History   Marital status: Widowed    Spouse name: Not on file   Number of children: 2   Years of education: 5   Highest education level: Not on file  Occupational History   Occupation: retired    Comment: Hotel manager  Tobacco Use   Smoking status: Never   Smokeless tobacco: Never  Vaping Use   Vaping status: Never Used  Substance and Sexual Activity   Alcohol use: No   Drug use: No   Sexual activity: Never    Birth control/protection: Surgical  Other Topics Concern   Not on file  Social History Narrative   Husband died after over 40 years and then a year after husband died, she met a man who she had a relationship with for over ten years. He died in 09/09/17 after long illness.   Live alone.    Has two children, grown sons.    Gets out with neices, enjoys going out to eat, going to [employer].   Goes to church.    Eats all food groups, eats some meat.       Social Determinants of Health   Financial Resource Strain: Low Risk  (06/02/2022)   Overall Financial Resource Strain (CARDIA)    Difficulty of Paying Living Expenses: Not hard at all  Food Insecurity: No Food Insecurity (06/02/2022)   Hunger Vital Sign    Worried About Running Out of Food in the Last Year: Never true    Ran Out of Food in the Last Year: Never true  Transportation Needs: No Transportation Needs (06/02/2022)   PRAPARE - Administrator, Civil Service (Medical): No    Lack of Transportation  (Non-Medical): No  Physical Activity: Inactive (05/21/2021)   Exercise Vital Sign    Days of Exercise per Week: 0 days    Minutes of Exercise per Session: 0 min  Stress: No Stress Concern Present (06/02/2022)   [employer] of Occupational Health - Occupational Stress Questionnaire    Feeling of Stress : Not at all  Social Connections: Moderately Isolated (06/02/2022)  Social Advertising account executive [NHANES]    Frequency of Communication with Friends and Family: More than three times a week    Frequency of Social Gatherings with Friends and Family: More than three times a week    Attends Religious Services: More than 4 times per year    Active Member of [employer] or Organizations: No    Attends Banker Meetings: Never    Marital Status: Widowed  Intimate Partner Violence: Not At Risk (05/21/2021)   Humiliation, Afraid, Rape, and Kick questionnaire    Fear of Current or Ex-Partner: No    Emotionally Abused: No    Physically Abused: No    Sexually Abused: No    Outpatient Medications Prior to Visit  Medication Sig Dispense Refill   aspirin EC 81 MG tablet Take 81 mg by mouth daily.     b complex vitamins capsule Take 1 capsule by mouth daily.     Cholecalciferol (VITAMIN D) 50 MCG (2000 UT) tablet Take 2,000 Units by mouth daily.     furosemide (LASIX) 20 MG tablet Take 1 tablet (20 mg total) by mouth once as needed for up to 1 dose. 30 tablet 3   levothyroxine (SYNTHROID) 88 MCG tablet Take 1 tablet (88 mcg total) by mouth daily before breakfast. 90 tablet 1   polyethylene glycol powder (GLYCOLAX/MIRALAX) 17 GM/SCOOP powder Take 1 Container by mouth once. As needed     lisinopril (ZESTRIL) 10 MG tablet Take 1 tablet (10 mg total) by mouth daily. 90 tablet 1   magnesium citrate SOLN Take 1 Bottle by mouth once.     acetaminophen (TYLENOL) 500 MG tablet Take 500 mg by mouth 2 (two) times daily as needed for moderate pain. (Patient not taking: Reported on 01/11/2023)      magnesium hydroxide (MILK OF MAGNESIA) 400 MG/5ML suspension Take 15 mLs by mouth daily as needed for moderate constipation.     tizanidine (ZANAFLEX) 2 MG capsule Take 1 capsule (2 mg total) by mouth 3 (three) times daily as needed for muscle spasms. Do not drink alcohol or drive while taking this medication. May cause drowsiness 15 capsule 0   No facility-administered medications prior to visit.    Allergies  Allergen Reactions   Codeine Nausea And Vomiting   Crestor [Rosuvastatin] Other (See Comments)    Body aches   Flexall [Menthol (Topical Analgesic)] Rash   Penicillins Rash    ROS Review of Systems  Constitutional:  Positive for fatigue. Negative for chills and fever.  HENT:  Negative for congestion, sinus pressure, sinus pain and sore throat.   Eyes:  Negative for pain and discharge.  Respiratory:  Negative for cough and shortness of breath.   Cardiovascular:  Positive for leg swelling. Negative for chest pain and palpitations.  Gastrointestinal:  Negative for abdominal pain, diarrhea, nausea and vomiting.  Endocrine: Negative for polydipsia and polyuria.  Genitourinary:  Negative for dysuria and hematuria.  Musculoskeletal:  Positive for arthralgias and back pain. Negative for neck pain and neck stiffness.  Skin:  Negative for rash.  Neurological:  Positive for weakness, numbness and headaches. Negative for dizziness.  Psychiatric/Behavioral:  Negative for agitation and behavioral problems. The patient is not nervous/anxious.       Objective:    Physical Exam Vitals reviewed.  Constitutional:      General: She is not in acute distress.    Appearance: She is not diaphoretic.  HENT:     Head: Normocephalic and atraumatic.  Nose: Nose normal.     Mouth/Throat:     Mouth: Mucous membranes are moist.  Eyes:     General: No scleral icterus.    Extraocular Movements: Extraocular movements intact.  Cardiovascular:     Rate and Rhythm: Normal rate and regular  rhythm.     Pulses: Normal pulses.     Heart sounds: Normal heart sounds. No murmur heard. Pulmonary:     Breath sounds: Normal breath sounds. No wheezing or rales.  Musculoskeletal:     Cervical back: Neck supple. No tenderness.     Right lower leg: No edema.     Left lower leg: No edema.  Skin:    General: Skin is warm.     Findings: No rash.  Neurological:     General: No focal deficit present.     Mental Status: She is alert and oriented to person, place, and time.     Sensory: No sensory deficit.     Motor: No weakness.  Psychiatric:        Mood and Affect: Mood normal.        Behavior: Behavior normal.     BP 115/65 (BP Location: Right Arm, Patient Position: Sitting, Cuff Size: Normal)   Pulse (!) 57   Ht 5\' 3"  (1.6 m)   Wt 179 lb 6.4 oz (81.4 kg)   LMP 07/20/1990 (Approximate)   SpO2 91%   BMI 31.78 kg/m  Wt Readings from Last 3 Encounters:  03/19/23 179 lb 6.4 oz (81.4 kg)  01/11/23 178 lb 6.4 oz (80.9 kg)  11/20/22 181 lb 12.8 oz (82.5 kg)    Lab Results  Component Value Date   TSH 3.240 03/11/2023   Lab Results  Component Value Date   WBC 6.0 07/15/2022   HGB 12.8 07/15/2022   HCT 38.8 07/15/2022   MCV 88 07/15/2022   PLT 207 07/15/2022   Lab Results  Component Value Date   NA 142 03/11/2023   K 5.2 03/11/2023   CO2 17 (L) 03/11/2023   GLUCOSE 92 03/11/2023   BUN 19 03/11/2023   CREATININE 1.03 (H) 03/11/2023   BILITOT 0.3 07/15/2022   ALKPHOS 74 07/15/2022   AST 17 07/15/2022   ALT 12 07/15/2022   PROT 6.0 07/15/2022   ALBUMIN 4.0 07/15/2022   CALCIUM 9.8 03/11/2023   ANIONGAP 13 05/01/2015   EGFR 55 (L) 03/11/2023   Lab Results  Component Value Date   CHOL 210 (H) 07/15/2022   Lab Results  Component Value Date   HDL 46 07/15/2022   Lab Results  Component Value Date   LDLCALC 141 (H) 07/15/2022   Lab Results  Component Value Date   TRIG 127 07/15/2022   Lab Results  Component Value Date   CHOLHDL 4.6 (H) 07/15/2022    Lab Results  Component Value Date   HGBA1C 5.5 07/15/2022      Assessment & Plan:   Problem List Items Addressed This Visit       Cardiovascular and Mediastinum   Hypertension - Primary    BP Readings from Last 1 Encounters:  03/19/23 115/65   Well-controlled with lisinopril 10 mg QD Considering very tightly controlled blood pressure and episodes of dizziness, advised to take only half tablet of lisinopril QD for now Counseled for compliance with the medications Advised DASH diet and moderate exercise/walking, at least 150 mins/week       Relevant Medications   lisinopril (ZESTRIL) 10 MG tablet     Endocrine  Hypothyroidism    Lab Results  Component Value Date   TSH 3.240 03/11/2023   On levothyroxine 88 mcg QD Checked TSH and free T4 - wnl now Her fatigue is likely due to relatively normal thyroid profile, she used to have oversupplemented thyroid profile with NP thyroid      Relevant Orders   TSH + free T4     Genitourinary   Stage 3a chronic kidney disease (HCC)    Likely due to inadequate fluid intake and/or age related decline Has h/o horseshoe kidney On ACEi Avoid nephrotoxic agents Advised to increase fluid intake Checked BMP, has shown hyperkalemia in the past - likely due to potassium rich food in addition to lisinopril, advised to avoid potassium rich food in excess      Relevant Orders   VITAMIN D 25 Hydroxy (Vit-D Deficiency, Fractures)   CMP14+EGFR   CBC with Differential/Platelet     Other   Hyperlipidemia    Lipid profile reviewed Advised to follow low cholesterol diet for now      Relevant Medications   lisinopril (ZESTRIL) 10 MG tablet   Other Relevant Orders   Lipid panel   Leg swelling    Likely due to chronic venous insufficiency Leg elevation and compression socks advised She has Lasix, but  advised her to use it only as needed for resistant leg swelling      Other Visit Diagnoses     Hyperglycemia       Relevant  Orders   Hemoglobin A1c        Meds ordered this encounter  Medications   lisinopril (ZESTRIL) 10 MG tablet    Sig: Take 0.5 tablets (5 mg total) by mouth daily.    Follow-up: Return in about 4 months (around 07/19/2023) for Annual physical (after 07/22/23).    Anabel Halon, MD

## 2023-03-19 NOTE — Assessment & Plan Note (Signed)
Likely due to inadequate fluid intake and/or age related decline Has h/o horseshoe kidney On ACEi Avoid nephrotoxic agents Advised to increase fluid intake Checked BMP, has shown hyperkalemia in the past - likely due to potassium rich food in addition to lisinopril, advised to avoid potassium rich food in excess

## 2023-03-19 NOTE — Patient Instructions (Signed)
Please start taking half tablet of Lisinopril once daily.  Please contact us with blood pressure after 1 week.  Please perform leg elevation and wear compression socks for leg swelling.

## 2023-03-19 NOTE — Assessment & Plan Note (Signed)
Lipid profile reviewed Advised to follow low cholesterol diet for now

## 2023-04-02 ENCOUNTER — Telehealth: Payer: Self-pay

## 2023-04-02 ENCOUNTER — Other Ambulatory Visit: Payer: Self-pay

## 2023-04-02 DIAGNOSIS — I10 Essential (primary) hypertension: Secondary | ICD-10-CM

## 2023-04-02 NOTE — Telephone Encounter (Signed)
Called patient to let her know Dr Allena Katz had review BP readings and wants her to continue taking the half of Lisinopril (5MG ). Patient asked to have the 5mg  dose called in when its time for a refill. Advised pt to call or send mychart msg when she needs it refilled.

## 2023-05-03 ENCOUNTER — Ambulatory Visit (INDEPENDENT_AMBULATORY_CARE_PROVIDER_SITE_OTHER): Payer: Medicare PPO

## 2023-05-03 DIAGNOSIS — Z23 Encounter for immunization: Secondary | ICD-10-CM

## 2023-05-29 DIAGNOSIS — M5430 Sciatica, unspecified side: Secondary | ICD-10-CM | POA: Diagnosis not present

## 2023-05-29 DIAGNOSIS — E669 Obesity, unspecified: Secondary | ICD-10-CM | POA: Diagnosis not present

## 2023-05-29 DIAGNOSIS — E039 Hypothyroidism, unspecified: Secondary | ICD-10-CM | POA: Diagnosis not present

## 2023-05-29 DIAGNOSIS — R32 Unspecified urinary incontinence: Secondary | ICD-10-CM | POA: Diagnosis not present

## 2023-05-29 DIAGNOSIS — K219 Gastro-esophageal reflux disease without esophagitis: Secondary | ICD-10-CM | POA: Diagnosis not present

## 2023-05-29 DIAGNOSIS — I129 Hypertensive chronic kidney disease with stage 1 through stage 4 chronic kidney disease, or unspecified chronic kidney disease: Secondary | ICD-10-CM | POA: Diagnosis not present

## 2023-05-29 DIAGNOSIS — M1990 Unspecified osteoarthritis, unspecified site: Secondary | ICD-10-CM | POA: Diagnosis not present

## 2023-05-29 DIAGNOSIS — E785 Hyperlipidemia, unspecified: Secondary | ICD-10-CM | POA: Diagnosis not present

## 2023-05-29 DIAGNOSIS — K5900 Constipation, unspecified: Secondary | ICD-10-CM | POA: Diagnosis not present

## 2023-07-06 NOTE — Progress Notes (Signed)
This encounter was created in error - please disregard.

## 2023-07-12 ENCOUNTER — Other Ambulatory Visit: Payer: Self-pay | Admitting: Internal Medicine

## 2023-07-12 DIAGNOSIS — E039 Hypothyroidism, unspecified: Secondary | ICD-10-CM

## 2023-07-15 DIAGNOSIS — E782 Mixed hyperlipidemia: Secondary | ICD-10-CM | POA: Diagnosis not present

## 2023-07-15 DIAGNOSIS — N1831 Chronic kidney disease, stage 3a: Secondary | ICD-10-CM | POA: Diagnosis not present

## 2023-07-15 DIAGNOSIS — E039 Hypothyroidism, unspecified: Secondary | ICD-10-CM | POA: Diagnosis not present

## 2023-07-15 DIAGNOSIS — R739 Hyperglycemia, unspecified: Secondary | ICD-10-CM | POA: Diagnosis not present

## 2023-07-16 LAB — CMP14+EGFR
ALT: 13 [IU]/L (ref 0–32)
AST: 17 [IU]/L (ref 0–40)
Albumin: 3.9 g/dL (ref 3.8–4.8)
Alkaline Phosphatase: 77 [IU]/L (ref 44–121)
BUN/Creatinine Ratio: 19 (ref 12–28)
BUN: 19 mg/dL (ref 8–27)
Bilirubin Total: 0.3 mg/dL (ref 0.0–1.2)
CO2: 22 mmol/L (ref 20–29)
Calcium: 9.2 mg/dL (ref 8.7–10.3)
Chloride: 107 mmol/L — ABNORMAL HIGH (ref 96–106)
Creatinine, Ser: 1 mg/dL (ref 0.57–1.00)
Globulin, Total: 2 g/dL (ref 1.5–4.5)
Glucose: 93 mg/dL (ref 70–99)
Potassium: 4.8 mmol/L (ref 3.5–5.2)
Sodium: 143 mmol/L (ref 134–144)
Total Protein: 5.9 g/dL — ABNORMAL LOW (ref 6.0–8.5)
eGFR: 57 mL/min/{1.73_m2} — ABNORMAL LOW (ref >59)

## 2023-07-16 LAB — CBC WITH DIFFERENTIAL/PLATELET
Basophils Absolute: 0.1 10*3/uL (ref 0.0–0.2)
Basos: 1 %
EOS (ABSOLUTE): 0.1 10*3/uL (ref 0.0–0.4)
Eos: 2 %
Hematocrit: 40.4 % (ref 34.0–46.6)
Hemoglobin: 13.3 g/dL (ref 11.1–15.9)
Immature Grans (Abs): 0 10*3/uL (ref 0.0–0.1)
Immature Granulocytes: 0 %
Lymphocytes Absolute: 2.3 10*3/uL (ref 0.7–3.1)
Lymphs: 40 %
MCH: 28.9 pg (ref 26.6–33.0)
MCHC: 32.9 g/dL (ref 31.5–35.7)
MCV: 88 fL (ref 79–97)
Monocytes Absolute: 0.4 10*3/uL (ref 0.1–0.9)
Monocytes: 8 %
Neutrophils Absolute: 2.9 10*3/uL (ref 1.4–7.0)
Neutrophils: 49 %
Platelets: 204 10*3/uL (ref 150–450)
RBC: 4.61 x10E6/uL (ref 3.77–5.28)
RDW: 13.6 % (ref 11.7–15.4)
WBC: 5.8 10*3/uL (ref 3.4–10.8)

## 2023-07-16 LAB — LIPID PANEL
Chol/HDL Ratio: 5.7 {ratio} — ABNORMAL HIGH (ref 0.0–4.4)
Cholesterol, Total: 216 mg/dL — ABNORMAL HIGH (ref 100–199)
HDL: 38 mg/dL — ABNORMAL LOW (ref >39)
LDL Chol Calc (NIH): 142 mg/dL — ABNORMAL HIGH (ref 0–99)
Triglycerides: 197 mg/dL — ABNORMAL HIGH (ref 0–149)
VLDL Cholesterol Cal: 36 mg/dL (ref 5–40)

## 2023-07-16 LAB — TSH+FREE T4
Free T4: 1.2 ng/dL (ref 0.82–1.77)
TSH: 5.07 u[IU]/mL — ABNORMAL HIGH (ref 0.450–4.500)

## 2023-07-16 LAB — HEMOGLOBIN A1C
Est. average glucose Bld gHb Est-mCnc: 117 mg/dL
Hgb A1c MFr Bld: 5.7 % — ABNORMAL HIGH (ref 4.8–5.6)

## 2023-07-16 LAB — VITAMIN D 25 HYDROXY (VIT D DEFICIENCY, FRACTURES): Vit D, 25-Hydroxy: 59.9 ng/mL (ref 30.0–100.0)

## 2023-07-23 ENCOUNTER — Encounter: Payer: Self-pay | Admitting: Internal Medicine

## 2023-07-23 ENCOUNTER — Ambulatory Visit (INDEPENDENT_AMBULATORY_CARE_PROVIDER_SITE_OTHER): Payer: Medicare PPO | Admitting: Internal Medicine

## 2023-07-23 VITALS — BP 123/63 | HR 58 | Ht 63.0 in | Wt 180.2 lb

## 2023-07-23 DIAGNOSIS — N952 Postmenopausal atrophic vaginitis: Secondary | ICD-10-CM | POA: Insufficient documentation

## 2023-07-23 DIAGNOSIS — E782 Mixed hyperlipidemia: Secondary | ICD-10-CM

## 2023-07-23 DIAGNOSIS — I10 Essential (primary) hypertension: Secondary | ICD-10-CM

## 2023-07-23 DIAGNOSIS — M150 Primary generalized (osteo)arthritis: Secondary | ICD-10-CM | POA: Insufficient documentation

## 2023-07-23 DIAGNOSIS — N1831 Chronic kidney disease, stage 3a: Secondary | ICD-10-CM

## 2023-07-23 DIAGNOSIS — E039 Hypothyroidism, unspecified: Secondary | ICD-10-CM

## 2023-07-23 DIAGNOSIS — Z0001 Encounter for general adult medical examination with abnormal findings: Secondary | ICD-10-CM

## 2023-07-23 MED ORDER — LISINOPRIL 5 MG PO TABS: 5.0000 mg | ORAL_TABLET | Freq: Every day | ORAL | 3 refills | Status: DC

## 2023-07-23 MED ORDER — LEVOTHYROXINE SODIUM 100 MCG PO TABS: 100.0000 ug | ORAL_TABLET | Freq: Every day | ORAL | 1 refills | Status: DC

## 2023-07-23 MED ORDER — ESTRADIOL 0.1 MG/GM VA CREA
1.0000 | TOPICAL_CREAM | Freq: Every day | VAGINAL | 2 refills | Status: DC
Start: 2023-07-23 — End: 2023-07-27

## 2023-07-23 NOTE — Assessment & Plan Note (Addendum)
 Lab Results  Component Value Date   TSH 5.070 (H) 07/15/2023   On levothyroxine 88 mcg QD Checked TSH and free T4 - increased dose of levothyroxine to 100 mcg QD

## 2023-07-23 NOTE — Assessment & Plan Note (Signed)
 Started Estrace vaginal cream

## 2023-07-23 NOTE — Assessment & Plan Note (Addendum)
 Physical exam as documented. Fasting blood tests reviewed today. Advised to get Shingrix vaccines at local pharmacy.

## 2023-07-23 NOTE — Patient Instructions (Addendum)
 Please apply Estrace cream as prescribed.  Please start taking Levothyroxine 100 mcg once daily instead of 88 mcg.  Please continue to take medications as prescribed.  Please continue to follow low salt diet and ambulate as tolerated.

## 2023-07-23 NOTE — Assessment & Plan Note (Signed)
Lipid profile reviewed Advised to follow low cholesterol diet for now

## 2023-07-23 NOTE — Assessment & Plan Note (Signed)
 Hand joints and ankle Advised to take Tylenol arthritis PRN for pain Can use Voltaren gel PRN

## 2023-07-23 NOTE — Assessment & Plan Note (Signed)
 BP Readings from Last 1 Encounters:  07/23/23 123/63   Well-controlled with lisinopril 5 mg QD Counseled for compliance with the medications Advised DASH diet and moderate exercise/walking, at least 150 mins/week

## 2023-07-23 NOTE — Assessment & Plan Note (Signed)
Likely due to inadequate fluid intake and/or age related decline Has h/o horseshoe kidney On ACEi Avoid nephrotoxic agents Advised to increase fluid intake Checked BMP, has shown hyperkalemia in the past - likely due to potassium rich food in addition to lisinopril, advised to avoid potassium rich food in excess

## 2023-07-23 NOTE — Progress Notes (Signed)
 Established Patient Office Visit  Subjective:  Patient ID: [name], female    DOB: August 21, 1940  Age: 83 y.o. MRN: [id number]  CC:  Chief Complaint  Patient presents with   Annual Exam    HPI [name] is a 83 y.o. female with past medical history of HTN, hypothyroidism and vitamin D  deficiency who presents for annual physical.  HTN: Her BP is well-controlled currently. She denies any headache, chest pain or palpitations.  She denies dizziness since decreasing dose of lisinopril .   Hypothyroidism: She has been taking levothyroxine  88 mcg.  Her TSH is elevated.  She reports unintentional weight gain recently.  She does report fatigue and feeling sluggish in the morning since switching from NP thyroid .  She used to have oversupplemented thyroid  profile in the past.  Of note, her jitteriness/anxiety and palpitations have resolved now since switching from NP thyroid .  Her BMP showed GFR of 57, better compared to prior-50.  She denies any dysuria, hematuria, urinary hesitance or resistance.  She is trying to improve fluid intake.   She reports bilateral ankle swelling, but has no pitting edema today.  She has dyspnea on heavy exertion, but denies any orthopnea or PND currently.  She reports feeling weak, but attributes it to her hip pain.  She has noticed improvement with physical therapy.  She was given Lasix  for leg swelling, which she has taken only 3 times since 06/24.  She reports perineal area dryness and itching at times.  She used to take estrogen supplement in the past, which was helping her with perineal irritation and urinary incontinence.  She has had hysterectomy.  Past Medical History:  Diagnosis Date   Allergy    Anemia    prior to hysterectomy   Back pain    comepensating for knee pain per pt   Blood transfusion without reported diagnosis    Cancer (HCC)    Cataract    right eye and immature   Complication of anesthesia    Constipation    takes Colace daily  and Milk of Mag   CTS (carpal tunnel syndrome) 08/31/2016   Dry eyes    uses Eye Drops daily as needed   Female hypogonadism syndrome    GERD (gastroesophageal reflux disease)    History of colon polyps    Hyperlipidemia    was on meds but stopped taking back in June 2015   Hypertension    takes Lisinopril  daily   Hypothyroidism    takes Synthroid  daily   Obesity (BMI 30.0-34.9)    Peripheral arterial disease (HCC)    Primary localized osteoarthritis of left knee 04/17/2015   Primary localized osteoarthritis of right knee    knees   Stage 3a chronic kidney disease (HCC) 10/14/2021    Past Surgical History:  Procedure Laterality Date   ABDOMINAL HYSTERECTOMY  90's   complete   APPENDECTOMY  80's   BACK SURGERY     CATARACT EXTRACTION W/PHACO Right 12/23/2015   Procedure: CATARACT EXTRACTION PHACO AND INTRAOCULAR LENS PLACEMENT RIGHT EYE CDE=6.42;  Surgeon: Dow JULIANNA Burke, MD;  Location: [facility];  Service: Ophthalmology;  Laterality: Right;   CATARACT EXTRACTION W/PHACO Left 05/11/2022   Procedure: CATARACT EXTRACTION PHACO AND INTRAOCULAR LENS PLACEMENT (IOC);  Surgeon: Harrie Agent, MD;  Location: [facility];  Service: Ophthalmology;  Laterality: Left;  CDE 10.71   COLONOSCOPY  09/09/2004   MFM:Wnmfjo rectum and colon   COLONOSCOPY N/A 11/08/2013   Procedure: COLONOSCOPY;  Surgeon: Lamar  CHRISTELLA Hollingshead, MD;  Location: [facility];  Service: Endoscopy;  Laterality: N/A;  10:45   COLONOSCOPY WITH PROPOFOL  N/A 06/30/2021   Surgeon: Hollingshead Lamar CHRISTELLA, MD; diverticulosis in sigmoid, descending, transverse colon, nonbleeding internal hemorrhoids, otherwise normal exam.  No repeat due to age.   JOINT REPLACEMENT     2015, 2016   NECK SURGERY  1983   RECTOCELE REPAIR  10/2007   SPINE SURGERY     ruptured disc   TOTAL KNEE ARTHROPLASTY Right 04/04/2014   DR JANE   TOTAL KNEE ARTHROPLASTY Right 04/04/2014   Procedure: RIGHT TOTAL KNEE ARTHROPLASTY;  Surgeon: Lamar DELENA Jane, MD;   Location: [facility] OR;  Service: Orthopedics;  Laterality: Right;   TOTAL KNEE ARTHROPLASTY Left 04/29/2015   Procedure: TOTAL KNEE ARTHROPLASTY;  Surgeon: Lamar Jane, MD;  Location: [facility] OR;  Service: Orthopedics;  Laterality: Left;   TUBAL LIGATION      Family History  Problem Relation Age of Onset   Colon cancer Sister        age 4s   Myasthenia gravis Sister    Bladder Cancer Brother    Lung cancer Brother        age 62   Alzheimer's disease Mother    COPD Father    Asthma Father    Alcohol  abuse Brother        likely died of pneumonia    Social History   Socioeconomic History   Marital status: Widowed    Spouse name: Not on file   Number of children: 2   Years of education: 61   Highest education level: Not on file  Occupational History   Occupation: retired    Comment: hotel manager  Tobacco Use   Smoking status: Never   Smokeless tobacco: Never  Vaping Use   Vaping status: Never Used  Substance and Sexual Activity   Alcohol  use: No   Drug use: No   Sexual activity: Never    Birth control/protection: Surgical  Other Topics Concern   Not on file  Social History Narrative   Husband died after over 40 years and then a year after husband died, she met a man who she had a relationship with for over ten years. He died in 09/19/17 after long illness.   Live alone.    Has two children, grown sons.    Gets out with neices, enjoys going out to eat, going to [employer].   Goes to church.    Eats all food groups, eats some meat.       Social Drivers of Corporate Investment Banker Strain: Low Risk  (06/02/2022)   Overall Financial Resource Strain (CARDIA)    Difficulty of Paying Living Expenses: Not hard at all  Food Insecurity: No Food Insecurity (06/02/2022)   Hunger Vital Sign    Worried About Running Out of Food in the Last Year: Never true    Ran Out of Food in the Last Year: Never true  Transportation Needs: No Transportation Needs (06/02/2022)   PRAPARE  - Administrator, Civil Service (Medical): No    Lack of Transportation (Non-Medical): No  Physical Activity: Inactive (05/21/2021)   Exercise Vital Sign    Days of Exercise per Week: 0 days    Minutes of Exercise per Session: 0 min  Stress: No Stress Concern Present (06/02/2022)   [employer] of Occupational Health - Occupational Stress Questionnaire    Feeling of Stress : Not at all  Social Connections: Moderately Isolated (06/02/2022)   Social Connection and Isolation Panel [NHANES]    Frequency of Communication with Friends and Family: More than three times a week    Frequency of Social Gatherings with Friends and Family: More than three times a week    Attends Religious Services: More than 4 times per year    Active Member of [employer] or Organizations: No    Attends Banker Meetings: Never    Marital Status: Widowed  Intimate Partner Violence: Not At Risk (05/21/2021)   Humiliation, Afraid, Rape, and Kick questionnaire    Fear of Current or Ex-Partner: No    Emotionally Abused: No    Physically Abused: No    Sexually Abused: No    Outpatient Medications Prior to Visit  Medication Sig Dispense Refill   aspirin  EC 81 MG tablet Take 81 mg by mouth daily.     b complex vitamins capsule Take 1 capsule by mouth daily.     Cholecalciferol (VITAMIN D ) 50 MCG (2000 UT) tablet Take 2,000 Units by mouth daily.     furosemide  (LASIX ) 20 MG tablet Take 1 tablet (20 mg total) by mouth once as needed for up to 1 dose. (Patient not taking: Reported on 07/23/2023) 30 tablet 3   polyethylene glycol powder (GLYCOLAX /MIRALAX ) 17 GM/SCOOP powder Take 1 Container by mouth once. As needed     levothyroxine  (SYNTHROID ) 88 MCG tablet TAKE ONE TABLET ( TOTAL) BY MOUTH DAILY BEFORE BREAKFAST 90 tablet 1   lisinopril  (ZESTRIL ) 10 MG tablet Take 0.5 tablets (5 mg total) by mouth daily.     No facility-administered medications prior to visit.    Allergies  Allergen  Reactions   Codeine Nausea And Vomiting   Crestor  [Rosuvastatin ] Other (See Comments)    Body aches   Flexall [Menthol (Topical Analgesic)] Rash   Penicillins Rash    ROS Review of Systems  Constitutional:  Positive for fatigue. Negative for chills and fever.  HENT:  Negative for congestion, sinus pressure, sinus pain and sore throat.   Eyes:  Negative for pain and discharge.  Respiratory:  Negative for cough and shortness of breath.   Cardiovascular:  Positive for leg swelling. Negative for chest pain and palpitations.  Gastrointestinal:  Negative for abdominal pain, diarrhea, nausea and vomiting.  Endocrine: Negative for polydipsia and polyuria.  Genitourinary:  Negative for dysuria and hematuria.  Musculoskeletal:  Positive for arthralgias and back pain. Negative for neck pain and neck stiffness.  Skin:  Negative for rash.  Neurological:  Positive for weakness. Negative for dizziness.  Psychiatric/Behavioral:  Negative for agitation and behavioral problems. The patient is not nervous/anxious.       Objective:    Physical Exam Vitals reviewed.  Constitutional:      General: She is not in acute distress.    Appearance: She is not diaphoretic.  HENT:     Head: Normocephalic and atraumatic.     Nose: Nose normal.     Mouth/Throat:     Mouth: Mucous membranes are moist.  Eyes:     General: No scleral icterus.    Extraocular Movements: Extraocular movements intact.  Cardiovascular:     Rate and Rhythm: Normal rate and regular rhythm.     Pulses: Normal pulses.     Heart sounds: Normal heart sounds. No murmur heard. Pulmonary:     Breath sounds: Normal breath sounds. No wheezing or rales.  Abdominal:     Palpations: Abdomen is soft.  Tenderness: There is no abdominal tenderness.  Musculoskeletal:     Cervical back: Neck supple. No tenderness.     Right lower leg: Edema (Mild) present.     Left lower leg: No edema.  Skin:    General: Skin is warm.     Findings: No  rash.  Neurological:     General: No focal deficit present.     Mental Status: She is alert and oriented to person, place, and time.     Cranial Nerves: No cranial nerve deficit.     Sensory: No sensory deficit.     Motor: No weakness.  Psychiatric:        Mood and Affect: Mood normal.        Behavior: Behavior normal.     BP 123/63 (BP Location: Left Arm, Patient Position: Sitting, Cuff Size: Large)   Pulse (!) 58   Ht 5' 3 (1.6 m)   Wt 180 lb 3.2 oz (81.7 kg)   LMP 07/20/1990 (Approximate)   SpO2 93%   BMI 31.92 kg/m  Wt Readings from Last 3 Encounters:  07/23/23 180 lb 3.2 oz (81.7 kg)  03/19/23 179 lb 6.4 oz (81.4 kg)  01/11/23 178 lb 6.4 oz (80.9 kg)    Lab Results  Component Value Date   TSH 5.070 (H) 07/15/2023   Lab Results  Component Value Date   WBC 5.8 07/15/2023   HGB 13.3 07/15/2023   HCT 40.4 07/15/2023   MCV 88 07/15/2023   PLT 204 07/15/2023   Lab Results  Component Value Date   NA 143 07/15/2023   K 4.8 07/15/2023   CO2 22 07/15/2023   GLUCOSE 93 07/15/2023   BUN 19 07/15/2023   CREATININE 1.00 07/15/2023   BILITOT 0.3 07/15/2023   ALKPHOS 77 07/15/2023   AST 17 07/15/2023   ALT 13 07/15/2023   PROT 5.9 (L) 07/15/2023   ALBUMIN 3.9 07/15/2023   CALCIUM  9.2 07/15/2023   ANIONGAP 13 05/01/2015   EGFR 57 (L) 07/15/2023   Lab Results  Component Value Date   CHOL 216 (H) 07/15/2023   Lab Results  Component Value Date   HDL 38 (L) 07/15/2023   Lab Results  Component Value Date   LDLCALC 142 (H) 07/15/2023   Lab Results  Component Value Date   TRIG 197 (H) 07/15/2023   Lab Results  Component Value Date   CHOLHDL 5.7 (H) 07/15/2023   Lab Results  Component Value Date   HGBA1C 5.7 (H) 07/15/2023      Assessment & Plan:   Problem List Items Addressed This Visit       Cardiovascular and Mediastinum   Hypertension   BP Readings from Last 1 Encounters:  07/23/23 123/63   Well-controlled with lisinopril  5 mg  QD Counseled for compliance with the medications Advised DASH diet and moderate exercise/walking, at least 150 mins/week       Relevant Medications   lisinopril  (ZESTRIL ) 5 MG tablet     Endocrine   Hypothyroidism   Lab Results  Component Value Date   TSH 5.070 (H) 07/15/2023   On levothyroxine  88 mcg QD Checked TSH and free T4 - increased dose of levothyroxine  to 100 mcg QD      Relevant Medications   levothyroxine  (SYNTHROID ) 100 MCG tablet   Other Relevant Orders   TSH + free T4     Musculoskeletal and Integument   Primary osteoarthritis involving multiple joints   Hand joints and ankle Advised to take Tylenol   arthritis PRN for pain Can use Voltaren gel PRN        Genitourinary   Stage 3a chronic kidney disease (HCC)   Likely due to inadequate fluid intake and/or age related decline Has h/o horseshoe kidney On ACEi Avoid nephrotoxic agents Advised to increase fluid intake Checked BMP, has shown hyperkalemia in the past - likely due to potassium rich food in addition to lisinopril , advised to avoid potassium rich food in excess      Relevant Orders   Basic Metabolic Panel (BMET)   Atrophic vaginitis   Started Estrace  vaginal cream      Relevant Medications   estradiol  (ESTRACE  VAGINAL) 0.1 MG/GM vaginal cream   Other Relevant Orders   Estrogens , total     Other   Hyperlipidemia   Lipid profile reviewed Advised to follow low cholesterol diet for now      Relevant Medications   lisinopril  (ZESTRIL ) 5 MG tablet   Encounter for general adult medical examination with abnormal findings - Primary   Physical exam as documented. Fasting blood tests reviewed today. Advised to get Shingrix vaccines at local pharmacy.       Meds ordered this encounter  Medications   estradiol  (ESTRACE  VAGINAL) 0.1 MG/GM vaginal cream    Sig: Place 1 Applicatorful vaginally at bedtime.    Dispense:  42.5 g    Refill:  2   lisinopril  (ZESTRIL ) 5 MG tablet    Sig: Take 1  tablet (5 mg total) by mouth daily.    Dispense:  90 tablet    Refill:  3   levothyroxine  (SYNTHROID ) 100 MCG tablet    Sig: Take 1 tablet (100 mcg total) by mouth daily before breakfast.    Dispense:  90 tablet    Refill:  1    Follow-up: Return in about 4 months (around 11/20/2023) for Hypothyroidism.    Suzzane MARLA Blanch, MD

## 2023-07-26 NOTE — Telephone Encounter (Signed)
 Copied from CRM [id number]. Topic: Clinical - Medication Question >> Jul 23, 2023 11:57 AM Monisha R wrote: Reason for CRM: Pharmacy need clarification on how much needs to be used on Estrodiam vaginal cream. Needs clear directions for usage.  Please contact [phone] which is the pharmacy number to update.

## 2023-07-27 MED ORDER — ESTRADIOL 0.1 MG/GM VA CREA
1.0000 | TOPICAL_CREAM | Freq: Every day | VAGINAL | 2 refills | Status: AC
Start: 2023-07-27 — End: 2023-09-07

## 2023-07-27 NOTE — Addendum Note (Signed)
 Addended byTrena Platt on: 07/27/2023 04:47 PM   Modules accepted: Orders

## 2023-07-27 NOTE — Telephone Encounter (Signed)
 Copied from CRM [id number]. Topic: Clinical - Medication Question >> Jul 27, 2023  3:32 PM Deleta HERO wrote: Reason for CRM: Pharmacy still requesting clarification on how much needs to be used on Estrodiam vaginal cream. They are concerned with the current dosage and need the specific number of grams she should use and how often. Attempted to consult with CAL, no answer. Callback #: [phone]

## 2023-09-29 DIAGNOSIS — L570 Actinic keratosis: Secondary | ICD-10-CM | POA: Diagnosis not present

## 2023-09-29 DIAGNOSIS — L280 Lichen simplex chronicus: Secondary | ICD-10-CM | POA: Diagnosis not present

## 2023-09-29 DIAGNOSIS — L821 Other seborrheic keratosis: Secondary | ICD-10-CM | POA: Diagnosis not present

## 2023-11-15 DIAGNOSIS — N1831 Chronic kidney disease, stage 3a: Secondary | ICD-10-CM | POA: Diagnosis not present

## 2023-11-15 DIAGNOSIS — E039 Hypothyroidism, unspecified: Secondary | ICD-10-CM | POA: Diagnosis not present

## 2023-11-15 DIAGNOSIS — N952 Postmenopausal atrophic vaginitis: Secondary | ICD-10-CM | POA: Diagnosis not present

## 2023-11-17 LAB — BASIC METABOLIC PANEL WITH GFR
BUN/Creatinine Ratio: 28 (ref 12–28)
BUN: 27 mg/dL (ref 8–27)
CO2: 21 mmol/L (ref 20–29)
Calcium: 9.6 mg/dL (ref 8.7–10.3)
Chloride: 99 mmol/L (ref 96–106)
Creatinine, Ser: 0.98 mg/dL (ref 0.57–1.00)
Glucose: 91 mg/dL (ref 70–99)
Potassium: 4.8 mmol/L (ref 3.5–5.2)
Sodium: 136 mmol/L (ref 134–144)
eGFR: 58 mL/min/{1.73_m2} — ABNORMAL LOW (ref >59)

## 2023-11-17 LAB — TSH+FREE T4
Free T4: 1.55 ng/dL (ref 0.82–1.77)
TSH: 1.36 u[IU]/mL (ref 0.450–4.500)

## 2023-11-17 LAB — ESTROGENS, TOTAL: Estrogen: 61 pg/mL (ref 40–244)

## 2023-11-23 ENCOUNTER — Ambulatory Visit: Payer: Medicare PPO | Admitting: Internal Medicine

## 2023-11-23 ENCOUNTER — Encounter: Payer: Self-pay | Admitting: Internal Medicine

## 2023-11-23 VITALS — BP 120/73 | HR 64 | Ht 63.0 in | Wt 180.4 lb

## 2023-11-23 DIAGNOSIS — I10 Essential (primary) hypertension: Secondary | ICD-10-CM | POA: Diagnosis not present

## 2023-11-23 DIAGNOSIS — E039 Hypothyroidism, unspecified: Secondary | ICD-10-CM | POA: Diagnosis not present

## 2023-11-23 DIAGNOSIS — W57XXXA Bitten or stung by nonvenomous insect and other nonvenomous arthropods, initial encounter: Secondary | ICD-10-CM

## 2023-11-23 DIAGNOSIS — M47816 Spondylosis without myelopathy or radiculopathy, lumbar region: Secondary | ICD-10-CM

## 2023-11-23 DIAGNOSIS — N1831 Chronic kidney disease, stage 3a: Secondary | ICD-10-CM | POA: Diagnosis not present

## 2023-11-23 DIAGNOSIS — E782 Mixed hyperlipidemia: Secondary | ICD-10-CM

## 2023-11-23 DIAGNOSIS — S90861A Insect bite (nonvenomous), right foot, initial encounter: Secondary | ICD-10-CM

## 2023-11-23 NOTE — Assessment & Plan Note (Addendum)
 Lipid profile reviewed No history of CVA, CAD or DM Advised to follow low cholesterol diet for now

## 2023-11-23 NOTE — Assessment & Plan Note (Signed)
Likely due to inadequate fluid intake and/or age related decline Has h/o horseshoe kidney On ACEi Avoid nephrotoxic agents Advised to increase fluid intake Checked BMP, has shown hyperkalemia in the past - likely due to potassium rich food in addition to lisinopril, advised to avoid potassium rich food in excess

## 2023-11-23 NOTE — Assessment & Plan Note (Addendum)
 BP Readings from Last 1 Encounters:  11/23/23 120/73   Well-controlled with lisinopril  5 mg QD Counseled for compliance with the medications Advised DASH diet and moderate exercise/walking as tolerated

## 2023-11-23 NOTE — Patient Instructions (Signed)
 Please continue to take medications as prescribed.  Please continue to follow low salt diet and ambulate as tolerated.  Please get blood tests done before the next visit.

## 2023-11-23 NOTE — Assessment & Plan Note (Signed)
 Previous MRI of lumbar spine reviewed Chronic, intermittent low back pain with radicular symptoms Left foot and leg numbness could be due to lumbar radiculopathy, although improved with magnesium supplement?,  Advised to continue magnesium supplement for now Avoid heavy lifting and frequent bending Tylenol  arthritis as needed for pain

## 2023-11-23 NOTE — Assessment & Plan Note (Signed)
 Lab Results  Component Value Date   TSH 1.360 11/15/2023   On levothyroxine  100 mcg QD Checked TSH and free T4

## 2023-11-23 NOTE — Progress Notes (Signed)
 Established Patient Office Visit  Subjective:  Patient ID: [name], female    DOB: 01-14-1941  Age: 83 y.o. MRN: [id number]  CC:  Chief Complaint  Patient presents with   Hypothyroidism    Four month follow up    Numbness    Numbness in left foot that radiates up the leg   Insect Bite    Hornet stung in right foot    HPI [name] is a 83 y.o. female with past medical history of HTN, hypothyroidism and vitamin D  deficiency who presents for annual physical.  HTN: Her BP is well-controlled currently. She denies any headache, chest pain or palpitations.  She denies dizziness since decreasing dose of lisinopril .   Hypothyroidism: She has been taking levothyroxine  100 mcg.  Her TSH is wnl now. She does report fatigue and feeling sluggish in the morning since switching from NP thyroid .  She used to have oversupplemented thyroid  profile in the past.  Of note, her jitteriness/anxiety and palpitations have resolved now since switching from NP thyroid .  Her BMP showed GFR of 58, stable compared to prior.  She denies any dysuria, hematuria, urinary hesitance or resistance.  She is trying to improve fluid intake.  She reports bilateral ankle swelling, but has no pitting edema today.  She has dyspnea on heavy exertion, but denies any orthopnea or PND currently.  She reports feeling weak, but attributes it to her hip pain.  She has noticed improvement with physical therapy.  She was given Lasix  for leg swelling, which she has taken very rarely.  She reports perineal area dryness and itching at times.  She used to take estrogen supplement in the past, which was helping her with perineal irritation and urinary incontinence.  She has had hysterectomy. She was given estrace  cream in the last visit. She stopped it after using it for few days, but is planning to restart it now.  She had an insect bite yesterday on the right foot.  She has mild swelling near the sole, but has improved since  yesterday.  She tried taking Tylenol  yesterday for pain, which has almost subsided now.  Denies any fever, chills or recent worsening of fatigue.  She reports intermittent numbness of the left foot and leg, which usually improves with magnesium supplement.  She has history of DDD of lumbar spine and has intermittent flareup of sciatica.  Denies any recent injury or fall.  Past Medical History:  Diagnosis Date   Allergy    Anemia    prior to hysterectomy   Back pain    comepensating for knee pain per pt   Blood transfusion without reported diagnosis    Cancer (HCC)    Cataract    right eye and immature   Complication of anesthesia    Constipation    takes Colace daily and Milk of Mag   CTS (carpal tunnel syndrome) 08/31/2016   Dry eyes    uses Eye Drops daily as needed   Female hypogonadism syndrome    GERD (gastroesophageal reflux disease)    History of colon polyps    Hyperlipidemia    was on meds but stopped taking back in June 2015   Hypertension    takes Lisinopril  daily   Hypothyroidism    takes Synthroid  daily   Obesity (BMI 30.0-34.9)    Peripheral arterial disease (HCC)    Primary localized osteoarthritis of left knee 04/17/2015   Primary localized osteoarthritis of right knee    knees  Stage 3a chronic kidney disease (HCC) 10/14/2021    Past Surgical History:  Procedure Laterality Date   ABDOMINAL HYSTERECTOMY  90's   complete   APPENDECTOMY  80's   BACK SURGERY     CATARACT EXTRACTION W/PHACO Right 12/23/2015   Procedure: CATARACT EXTRACTION PHACO AND INTRAOCULAR LENS PLACEMENT RIGHT EYE CDE=6.42;  Surgeon: Clay Cummins, MD;  Location: [facility];  Service: Ophthalmology;  Laterality: Right;   CATARACT EXTRACTION W/PHACO Left 05/11/2022   Procedure: CATARACT EXTRACTION PHACO AND INTRAOCULAR LENS PLACEMENT (IOC);  Surgeon: Tarri Farm, MD;  Location: [facility];  Service: Ophthalmology;  Laterality: Left;  CDE 10.71   COLONOSCOPY  09/09/2004   [id number]  rectum and colon   COLONOSCOPY N/A 11/08/2013   Procedure: COLONOSCOPY;  Surgeon: Suzette Espy, MD;  Location: [facility];  Service: Endoscopy;  Laterality: N/A;  10:45   COLONOSCOPY WITH PROPOFOL  N/A 06/30/2021   Surgeon: Suzette Espy, MD; diverticulosis in sigmoid, descending, transverse colon, nonbleeding internal hemorrhoids, otherwise normal exam.  No repeat due to age.   JOINT REPLACEMENT     2015, 2016   NECK SURGERY  1983   RECTOCELE REPAIR  10/2007   SPINE SURGERY     ruptured disc   TOTAL KNEE ARTHROPLASTY Right 04/04/2014   DR Jinger Mount   TOTAL KNEE ARTHROPLASTY Right 04/04/2014   Procedure: RIGHT TOTAL KNEE ARTHROPLASTY;  Surgeon: Genevie Kerns, MD;  Location: [facility] OR;  Service: Orthopedics;  Laterality: Right;   TOTAL KNEE ARTHROPLASTY Left 04/29/2015   Procedure: TOTAL KNEE ARTHROPLASTY;  Surgeon: Elly Habermann, MD;  Location: [facility] OR;  Service: Orthopedics;  Laterality: Left;   TUBAL LIGATION      Family History  Problem Relation Age of Onset   Colon cancer Sister        age 61s   Myasthenia gravis Sister    Bladder Cancer Brother    Lung cancer Brother        age 76   Alzheimer's disease Mother    COPD Father    Asthma Father    Alcohol  abuse Brother        likely died of pneumonia    Social History   Socioeconomic History   Marital status: Widowed    Spouse name: Not on file   Number of children: 2   Years of education: 34   Highest education level: Not on file  Occupational History   Occupation: retired    Comment: Hotel manager  Tobacco Use   Smoking status: Never   Smokeless tobacco: Never  Vaping Use   Vaping status: Never Used  Substance and Sexual Activity   Alcohol  use: No   Drug use: No   Sexual activity: Never    Birth control/protection: Surgical  Other Topics Concern   Not on file  Social History Narrative   Husband died after over 40 years and then a year after husband died, she met a man who she had a relationship with  for over ten years. He died in 2017-09-18 after long illness.   Live alone.    Has two children, grown sons.    Gets out with neices, enjoys going out to eat, going to [employer].   Goes to church.    Eats all food groups, eats some meat.       Social Drivers of Corporate investment banker Strain: Low Risk  (06/02/2022)   Overall Financial Resource Strain (CARDIA)    Difficulty of Paying Living  Expenses: Not hard at all  Food Insecurity: No Food Insecurity (06/02/2022)   Hunger Vital Sign    Worried About Running Out of Food in the Last Year: Never true    Ran Out of Food in the Last Year: Never true  Transportation Needs: No Transportation Needs (06/02/2022)   PRAPARE - Administrator, Civil Service (Medical): No    Lack of Transportation (Non-Medical): No  Physical Activity: Inactive (05/21/2021)   Exercise Vital Sign    Days of Exercise per Week: 0 days    Minutes of Exercise per Session: 0 min  Stress: No Stress Concern Present (06/02/2022)   [employer] of Occupational Health - Occupational Stress Questionnaire    Feeling of Stress : Not at all  Social Connections: Moderately Isolated (06/02/2022)   Social Connection and Isolation Panel [NHANES]    Frequency of Communication with Friends and Family: More than three times a week    Frequency of Social Gatherings with Friends and Family: More than three times a week    Attends Religious Services: More than 4 times per year    Active Member of [employer] or Organizations: No    Attends Banker Meetings: Never    Marital Status: Widowed  Intimate Partner Violence: Not At Risk (05/21/2021)   Humiliation, Afraid, Rape, and Kick questionnaire    Fear of Current or Ex-Partner: No    Emotionally Abused: No    Physically Abused: No    Sexually Abused: No    Outpatient Medications Prior to Visit  Medication Sig Dispense Refill   aspirin  EC 81 MG tablet Take 81 mg by mouth daily.     b complex vitamins  capsule Take 1 capsule by mouth daily.     Cholecalciferol (VITAMIN D ) 50 MCG (2000 UT) tablet Take 2,000 Units by mouth daily.     furosemide  (LASIX ) 20 MG tablet Take 1 tablet (20 mg total) by mouth once as needed for up to 1 dose. (Patient not taking: Reported on 07/23/2023) 30 tablet 3   levothyroxine  (SYNTHROID ) 100 MCG tablet Take 1 tablet (100 mcg total) by mouth daily before breakfast. 90 tablet 1   lisinopril  (ZESTRIL ) 5 MG tablet Take 1 tablet (5 mg total) by mouth daily. 90 tablet 3   polyethylene glycol powder (GLYCOLAX /MIRALAX ) 17 GM/SCOOP powder Take 1 Container by mouth once. As needed     No facility-administered medications prior to visit.    Allergies  Allergen Reactions   Codeine Nausea And Vomiting   Crestor  [Rosuvastatin ] Other (See Comments)    Body aches   Flexall [Menthol (Topical Analgesic)] Rash   Penicillins Rash    ROS Review of Systems  Constitutional:  Positive for fatigue. Negative for chills and fever.  HENT:  Negative for congestion, sinus pressure, sinus pain and sore throat.   Eyes:  Negative for pain and discharge.  Respiratory:  Negative for cough and shortness of breath.   Cardiovascular:  Positive for leg swelling. Negative for chest pain and palpitations.  Gastrointestinal:  Negative for abdominal pain, diarrhea, nausea and vomiting.  Endocrine: Negative for polydipsia and polyuria.  Genitourinary:  Negative for dysuria and hematuria.  Musculoskeletal:  Positive for arthralgias and back pain. Negative for neck pain and neck stiffness.  Skin:  Negative for rash.  Neurological:  Positive for weakness. Negative for dizziness.  Psychiatric/Behavioral:  Negative for agitation and behavioral problems. The patient is not nervous/anxious.       Objective:    Physical  Exam Vitals reviewed.  Constitutional:      General: She is not in acute distress.    Appearance: She is not diaphoretic.  HENT:     Head: Normocephalic and atraumatic.     Nose:  Nose normal.     Mouth/Throat:     Mouth: Mucous membranes are moist.  Eyes:     General: No scleral icterus.    Extraocular Movements: Extraocular movements intact.  Cardiovascular:     Rate and Rhythm: Normal rate and regular rhythm.     Heart sounds: Normal heart sounds. No murmur heard. Pulmonary:     Breath sounds: Normal breath sounds. No wheezing or rales.  Musculoskeletal:     Cervical back: Neck supple. No tenderness.     Right lower leg: Edema (Mild) present.     Left lower leg: No edema.  Skin:    General: Skin is warm.     Findings: No rash.     Comments: Papule near sole of right foot, about 1 cm in diameter  Neurological:     General: No focal deficit present.     Mental Status: She is alert and oriented to person, place, and time.     Sensory: No sensory deficit.     Motor: No weakness.  Psychiatric:        Mood and Affect: Mood normal.        Behavior: Behavior normal.     BP 120/73   Pulse 64   Ht 5\' 3"  (1.6 m)   Wt 180 lb 6.4 oz (81.8 kg)   LMP 07/20/1990 (Approximate)   SpO2 94%   BMI 31.96 kg/m  Wt Readings from Last 3 Encounters:  11/23/23 180 lb 6.4 oz (81.8 kg)  07/23/23 180 lb 3.2 oz (81.7 kg)  03/19/23 179 lb 6.4 oz (81.4 kg)    Lab Results  Component Value Date   TSH 1.360 11/15/2023   Lab Results  Component Value Date   WBC 5.8 07/15/2023   HGB 13.3 07/15/2023   HCT 40.4 07/15/2023   MCV 88 07/15/2023   PLT 204 07/15/2023   Lab Results  Component Value Date   NA 136 11/15/2023   K 4.8 11/15/2023   CO2 21 11/15/2023   GLUCOSE 91 11/15/2023   BUN 27 11/15/2023   CREATININE 0.98 11/15/2023   BILITOT 0.3 07/15/2023   ALKPHOS 77 07/15/2023   AST 17 07/15/2023   ALT 13 07/15/2023   PROT 5.9 (L) 07/15/2023   ALBUMIN 3.9 07/15/2023   CALCIUM  9.6 11/15/2023   ANIONGAP 13 05/01/2015   EGFR 58 (L) 11/15/2023   Lab Results  Component Value Date   CHOL 216 (H) 07/15/2023   Lab Results  Component Value Date   HDL 38 (L)  07/15/2023   Lab Results  Component Value Date   LDLCALC 142 (H) 07/15/2023   Lab Results  Component Value Date   TRIG 197 (H) 07/15/2023   Lab Results  Component Value Date   CHOLHDL 5.7 (H) 07/15/2023   Lab Results  Component Value Date   HGBA1C 5.7 (H) 07/15/2023      Assessment & Plan:   Problem List Items Addressed This Visit       Cardiovascular and Mediastinum   Hypertension   BP Readings from Last 1 Encounters:  11/23/23 120/73   Well-controlled with lisinopril  5 mg QD Counseled for compliance with the medications Advised DASH diet and moderate exercise/walking as tolerated  Endocrine   Hypothyroidism   Lab Results  Component Value Date   TSH 1.360 11/15/2023   On levothyroxine  100 mcg QD Checked TSH and free T4      Relevant Orders   TSH + free T4     Musculoskeletal and Integument   Lumbar spondylosis   Previous MRI of lumbar spine reviewed Chronic, intermittent low back pain with radicular symptoms Left foot and leg numbness could be due to lumbar radiculopathy, although improved with magnesium supplement?,  Advised to continue magnesium supplement for now Avoid heavy lifting and frequent bending Tylenol  arthritis as needed for pain      Insect bite of right foot   Has mild skin reaction due to hornet sting Advised to take Benadryl  as needed for swelling Tylenol  as needed for pain        Genitourinary   Stage 3a chronic kidney disease (HCC) - Primary   Likely due to inadequate fluid intake and/or age related decline Has h/o horseshoe kidney On ACEi Avoid nephrotoxic agents Advised to increase fluid intake Checked BMP, has shown hyperkalemia in the past - likely due to potassium rich food in addition to lisinopril , advised to avoid potassium rich food in excess      Relevant Orders   CBC with Differential/Platelet   CMP14+EGFR     Other   Hyperlipidemia   Lipid profile reviewed No history of CVA, CAD or DM Advised to  follow low cholesterol diet for now        No orders of the defined types were placed in this encounter.   Follow-up: Return in about 4 months (around 03/25/2024) for Hypothyroidism and CKD.    Meldon Sport, MD

## 2023-11-23 NOTE — Assessment & Plan Note (Signed)
 Has mild skin reaction due to hornet sting Advised to take Benadryl  as needed for swelling Tylenol  as needed for pain

## 2023-11-24 ENCOUNTER — Ambulatory Visit: Payer: Medicare PPO

## 2023-12-22 ENCOUNTER — Ambulatory Visit (INDEPENDENT_AMBULATORY_CARE_PROVIDER_SITE_OTHER)

## 2023-12-22 VITALS — Ht 63.0 in | Wt 179.0 lb

## 2023-12-22 DIAGNOSIS — Z780 Asymptomatic menopausal state: Secondary | ICD-10-CM | POA: Diagnosis not present

## 2023-12-22 DIAGNOSIS — Z1231 Encounter for screening mammogram for malignant neoplasm of breast: Secondary | ICD-10-CM | POA: Diagnosis not present

## 2023-12-22 DIAGNOSIS — Z0000 Encounter for general adult medical examination without abnormal findings: Secondary | ICD-10-CM

## 2023-12-22 NOTE — Patient Instructions (Signed)
 [name] , Thank you for taking time out of your busy schedule to complete your Annual Wellness Visit with me. I enjoyed our conversation and look forward to speaking with you again next year. I, as well as your care team,  appreciate your ongoing commitment to your health goals. Please review the following plan we discussed and let me know if I can assist you in the future. Your Game plan/ To Do List    Referrals: If you haven't heard from the office you've been referred to, please reach out to them at the phone number provided.  I will call you with your mammogram and bone density appointment information Follow up Visits: Next Medicare AWV with our clinical staff:   December 25, 2024 at 1:10 pm telephone visit Have you seen your provider in the last 6 months (3 months if uncontrolled diabetes)? Yes Next Office Visit with your provider:   March 27, 2024 with Dr. Lydia Sams  Clinician Recommendations:   Aim for 30 minutes of exercise or brisk walking, 6-8 glasses of water , and 5 servings of fruits and vegetables each day.  I enjoyed our conversation today and look forward to talking with you again next year!! Have a wonderful and safe year. All the best, [place] This is a list of the screening recommended for you and due dates:  Health Maintenance  Topic Date Due   Zoster (Shingles) Vaccine (1 of 2) Never done   DEXA scan (bone density measurement)  01/13/2017   Mammogram  01/22/2023   COVID-19 Vaccine (5 - 2024-25 season) 03/21/2023   Flu Shot  02/18/2024   Medicare Annual Wellness Visit  12/21/2024   DTaP/Tdap/Td vaccine (2 - Td or Tdap) 12/31/2027   Pneumonia Vaccine  Completed   HPV Vaccine  Aged Out   Meningitis B Vaccine  Aged Out   Colon Cancer Screening  Discontinued   Hepatitis C Screening  Discontinued    Advanced directives: (Declined) Advance directive discussed with you today. Even though you declined this today, please call our office should you change your mind, and we can give  you the proper paperwork for you to fill out. Advance Care Planning is important because it:  [x]  Makes sure you receive the medical care that is consistent with your values, goals, and preferences  [x]  It provides guidance to your family and loved ones and reduces their decisional burden about whether or not they are making the right decisions based on your wishes.  Follow the link provided in your after visit summary or read over the paperwork we have mailed to you to help you started getting your Advance Directives in place. If you need assistance in completing these, please reach out to us  so that we can help you! See attachments for Preventive Care and Fall Prevention Tips.  Understanding Your Risk for Falls Millions of people have serious injuries from falls each year. It is important to understand your risk of falling. Talk with your health care provider about your risk and what you can do to lower it. If you do have a serious fall, make sure to tell your provider. Falling once raises your risk of falling again. How can falls affect me? Serious injuries from falls are common. These include: Broken bones, such as hip fractures. Head injuries, such as traumatic brain injuries (TBI) or concussions. A fear of falling can cause you to avoid activities and stay at home. This can make your muscles weaker and raise your risk for a  fall. What can increase my risk? There are a number of risk factors that increase your risk for falling. The more risk factors you have, the higher your risk of falling. Serious injuries from a fall happen most often to people who are older than 81 years old. Teenagers and young adults ages 13-29 are also at higher risk. Common risk factors include: Weakness in the lower body. Being generally weak or confused due to long-term (chronic) illness. Dizziness or balance problems. Poor vision. Medicines that cause dizziness or drowsiness. These may include: Medicines for  your blood pressure, heart, anxiety, insomnia, or swelling (edema). Pain medicines. Muscle relaxants. Other risk factors include: Drinking alcohol . Having had a fall in the past. Having foot pain or wearing improper footwear. Working at a dangerous job. Having any of the following in your home: Tripping hazards, such as floor clutter or loose rugs. Poor lighting. Pets. Having dementia or memory loss. What actions can I take to lower my risk of falling?     Physical activity Stay physically fit. Do strength and balance exercises. Consider taking a regular class to build strength and balance. Yoga and tai chi are good options. Vision Have your eyes checked every year and your prescription for glasses or contacts updated as needed. Shoes and walking aids Wear non-skid shoes. Wear shoes that have rubber soles and low heels. Do not wear high heels. Do not walk around the house in socks or slippers. Use a cane or walker as told by your provider. Home safety Attach secure railings on both sides of your stairs. Install grab bars for your bathtub, shower, and toilet. Use a non-skid mat in your bathtub or shower. Attach bath mats securely with double-sided, non-slip rug tape. Use good lighting in all rooms. Keep a flashlight near your bed. Make sure there is a clear path from your bed to the bathroom. Use night-lights. Do not use throw rugs. Make sure all carpeting is taped or tacked down securely. Remove all clutter from walkways and stairways, including extension cords. Repair uneven or broken steps and floors. Avoid walking on icy or slippery surfaces. Walk on the grass instead of on icy or slick sidewalks. Use ice melter to get rid of ice on walkways in the winter. Use a cordless phone. Questions to ask your health care provider Can you help me check my risk for a fall? Do any of my medicines make me more likely to fall? Should I take a vitamin D  supplement? What exercises can I do  to improve my strength and balance? Should I make an appointment to have my vision checked? Do I need a bone density test to check for weak bones (osteoporosis)? Would it help to use a cane or a walker? Where to find more information Centers for Disease Control and Prevention, [employer]: TonerPromos.no Community-Based Fall Prevention Programs: TonerPromos.no [employer] on Aging: BaseRingTones.pl Contact a health care provider if: You fall at home. You are afraid of falling at home. You feel weak, drowsy, or dizzy. This information is not intended to replace advice given to you by your health care provider. Make sure you discuss any questions you have with your health care provider. Document Revised: 03/09/2022 Document Reviewed: 03/09/2022 Elsevier Patient Education  2024 [employer].  Understanding Your Risk for Falls Millions of people have serious injuries from falls each year. It is important to understand your risk of falling. Talk with your health care provider about your risk and what you can do to lower  it. If you do have a serious fall, make sure to tell your provider. Falling once raises your risk of falling again. How can falls affect me? Serious injuries from falls are common. These include: Broken bones, such as hip fractures. Head injuries, such as traumatic brain injuries (TBI) or concussions. A fear of falling can cause you to avoid activities and stay at home. This can make your muscles weaker and raise your risk for a fall. What can increase my risk? There are a number of risk factors that increase your risk for falling. The more risk factors you have, the higher your risk of falling. Serious injuries from a fall happen most often to people who are older than 81 years old. Teenagers and young adults ages 71-29 are also at higher risk. Common risk factors include: Weakness in the lower body. Being generally weak or confused due to long-term (chronic) illness. Dizziness or balance  problems. Poor vision. Medicines that cause dizziness or drowsiness. These may include: Medicines for your blood pressure, heart, anxiety, insomnia, or swelling (edema). Pain medicines. Muscle relaxants. Other risk factors include: Drinking alcohol . Having had a fall in the past. Having foot pain or wearing improper footwear. Working at a dangerous job. Having any of the following in your home: Tripping hazards, such as floor clutter or loose rugs. Poor lighting. Pets. Having dementia or memory loss. What actions can I take to lower my risk of falling?     Physical activity Stay physically fit. Do strength and balance exercises. Consider taking a regular class to build strength and balance. Yoga and tai chi are good options. Vision Have your eyes checked every year and your prescription for glasses or contacts updated as needed. Shoes and walking aids Wear non-skid shoes. Wear shoes that have rubber soles and low heels. Do not wear high heels. Do not walk around the house in socks or slippers. Use a cane or walker as told by your provider. Home safety Attach secure railings on both sides of your stairs. Install grab bars for your bathtub, shower, and toilet. Use a non-skid mat in your bathtub or shower. Attach bath mats securely with double-sided, non-slip rug tape. Use good lighting in all rooms. Keep a flashlight near your bed. Make sure there is a clear path from your bed to the bathroom. Use night-lights. Do not use throw rugs. Make sure all carpeting is taped or tacked down securely. Remove all clutter from walkways and stairways, including extension cords. Repair uneven or broken steps and floors. Avoid walking on icy or slippery surfaces. Walk on the grass instead of on icy or slick sidewalks. Use ice melter to get rid of ice on walkways in the winter. Use a cordless phone. Questions to ask your health care provider Can you help me check my risk for a fall? Do any of  my medicines make me more likely to fall? Should I take a vitamin D  supplement? What exercises can I do to improve my strength and balance? Should I make an appointment to have my vision checked? Do I need a bone density test to check for weak bones (osteoporosis)? Would it help to use a cane or a walker? Where to find more information Centers for Disease Control and Prevention, [employer]: TonerPromos.no Community-Based Fall Prevention Programs: TonerPromos.no [employer] on Aging: BaseRingTones.pl Contact a health care provider if: You fall at home. You are afraid of falling at home. You feel weak, drowsy, or dizzy. This information is not intended to  replace advice given to you by your health care provider. Make sure you discuss any questions you have with your health care provider. Document Revised: 03/09/2022 Document Reviewed: 03/09/2022 Elsevier Patient Education  2024 [employer].

## 2023-12-22 NOTE — Progress Notes (Signed)
 Please attest and cosign this visit due to patients primary care provider not being in the office at the time the visit was completed.   Subjective:   [name] is a 83 y.o. who presents for a Medicare Wellness preventive visit.  As a reminder, Annual Wellness Visits don't include a physical exam, and some assessments may be limited, especially if this visit is performed virtually. We may recommend an in-person follow-up visit with your provider if needed.  Visit Complete: Virtual I connected with  Franciso Irwin on 12/22/23 by a audio enabled telemedicine application and verified that I am speaking with the correct person using two identifiers.  Patient Location: Home  Provider Location: Home Office  I discussed the limitations of evaluation and management by telemedicine. The patient expressed understanding and agreed to proceed.  Vital Signs: Because this visit was a virtual/telehealth visit, some criteria may be missing or patient reported. Any vitals not documented were not able to be obtained and vitals that have been documented are patient reported.  VideoDeclined- This patient declined Librarian, academic. Therefore the visit was completed with audio only.  Persons Participating in Visit: Patient.  AWV Questionnaire: No: Patient Medicare AWV questionnaire was not completed prior to this visit.  Cardiac Risk Factors include: advanced age (>59men, >68 women);hypertension;sedentary lifestyle;obesity (BMI >30kg/m2)     Objective:     Today's Vitals   12/22/23 0901 12/22/23 0902  Weight: 179 lb (81.2 kg)   Height: 5\' 3"  (1.6 m)   PainSc:  5    Body mass index is 31.71 kg/m.     12/22/2023    9:20 AM 08/19/2022    3:30 PM 06/02/2022    9:49 AM 05/11/2022   10:56 AM 06/30/2021    9:55 AM 05/21/2021   10:08 AM 11/29/2017    8:24 AM  Advanced Directives  Does Patient Have a Medical Advance Directive? No Yes Yes No Yes Yes No  Type of Administrator of [place];Living will  Living will;Healthcare Power of [employer] Power of Attorney   Does patient want to make changes to medical advance directive?      Yes (Inpatient - patient defers changing a medical advance directive at this time - Information given)   Copy of Healthcare Power of Attorney in Chart?   No - copy requested  No - copy requested No - copy requested   Would patient like information on creating a medical advance directive? No - Patient declined   No - Patient declined   Yes (ED - Information included in AVS)    Current Medications (verified) Outpatient Encounter Medications as of 12/22/2023  Medication Sig   aspirin  EC 81 MG tablet Take 81 mg by mouth daily.   b complex vitamins capsule Take 1 capsule by mouth daily.   Cholecalciferol (VITAMIN D ) 50 MCG (2000 UT) tablet Take 2,000 Units by mouth daily.   furosemide  (LASIX ) 20 MG tablet Take 1 tablet (20 mg total) by mouth once as needed for up to 1 dose.   levothyroxine  (SYNTHROID ) 100 MCG tablet Take 1 tablet (100 mcg total) by mouth daily before breakfast.   lisinopril  (ZESTRIL ) 5 MG tablet Take 1 tablet (5 mg total) by mouth daily.   polyethylene glycol powder (GLYCOLAX /MIRALAX ) 17 GM/SCOOP powder Take 1 Container by mouth once. As needed   No facility-administered encounter medications on file as of 12/22/2023.    Allergies (verified) Codeine, Crestor  [rosuvastatin ], Flexall [menthol (topical  analgesic)], and Penicillins   History: Past Medical History:  Diagnosis Date   Allergy    Anemia    prior to hysterectomy   Back pain    comepensating for knee pain per pt   Blood transfusion without reported diagnosis    Cancer (HCC)    Cataract    right eye and immature   Complication of anesthesia    Constipation    takes Colace daily and Milk of Mag   CTS (carpal tunnel syndrome) 08/31/2016   Dry eyes    uses Eye Drops daily as needed   Female hypogonadism syndrome    GERD  (gastroesophageal reflux disease)    History of colon polyps    Hyperlipidemia    was on meds but stopped taking back in June 2015   Hypertension    takes Lisinopril  daily   Hypothyroidism    takes Synthroid  daily   Obesity (BMI 30.0-34.9)    Peripheral arterial disease (HCC)    Primary localized osteoarthritis of left knee 04/17/2015   Primary localized osteoarthritis of right knee    knees   Stage 3a chronic kidney disease (HCC) 10/14/2021   Past Surgical History:  Procedure Laterality Date   ABDOMINAL HYSTERECTOMY  90's   complete   APPENDECTOMY  80's   BACK SURGERY     CATARACT EXTRACTION W/PHACO Right 12/23/2015   Procedure: CATARACT EXTRACTION PHACO AND INTRAOCULAR LENS PLACEMENT RIGHT EYE CDE=6.42;  Surgeon: Clay Cummins, MD;  Location: [facility];  Service: Ophthalmology;  Laterality: Right;   CATARACT EXTRACTION W/PHACO Left 05/11/2022   Procedure: CATARACT EXTRACTION PHACO AND INTRAOCULAR LENS PLACEMENT (IOC);  Surgeon: Tarri Farm, MD;  Location: [facility];  Service: Ophthalmology;  Laterality: Left;  CDE 10.71   COLONOSCOPY  09/09/2004   ZOX:WRUEAV rectum and colon   COLONOSCOPY N/A 11/08/2013   Procedure: COLONOSCOPY;  Surgeon: Suzette Espy, MD;  Location: [facility];  Service: Endoscopy;  Laterality: N/A;  10:45   COLONOSCOPY WITH PROPOFOL  N/A 06/30/2021   Surgeon: Suzette Espy, MD; diverticulosis in sigmoid, descending, transverse colon, nonbleeding internal hemorrhoids, otherwise normal exam.  No repeat due to age.   JOINT REPLACEMENT     2015, 2016   NECK SURGERY  1983   RECTOCELE REPAIR  10/2007   SPINE SURGERY     ruptured disc   TOTAL KNEE ARTHROPLASTY Right 04/04/2014   DR Jinger Mount   TOTAL KNEE ARTHROPLASTY Right 04/04/2014   Procedure: RIGHT TOTAL KNEE ARTHROPLASTY;  Surgeon: Genevie Kerns, MD;  Location: [facility] OR;  Service: Orthopedics;  Laterality: Right;   TOTAL KNEE ARTHROPLASTY Left 04/29/2015   Procedure: TOTAL KNEE ARTHROPLASTY;  Surgeon:  Elly Habermann, MD;  Location: [facility] OR;  Service: Orthopedics;  Laterality: Left;   TUBAL LIGATION     Family History  Problem Relation Age of Onset   Colon cancer Sister        age 56s   Myasthenia gravis Sister    Bladder Cancer Brother    Lung cancer Brother        age 57   Alzheimer's disease Mother    COPD Father    Asthma Father    Alcohol  abuse Brother        likely died of pneumonia   Social History   Socioeconomic History   Marital status: Widowed    Spouse name: Not on file   Number of children: 2   Years of education: 75   Highest education level: Not on  file  Occupational History   Occupation: retired    Comment: Hotel manager  Tobacco Use   Smoking status: Never   Smokeless tobacco: Never  Vaping Use   Vaping status: Never Used  Substance and Sexual Activity   Alcohol  use: No   Drug use: No   Sexual activity: Never    Birth control/protection: Surgical  Other Topics Concern   Not on file  Social History Narrative   Husband died after over 40 years and then a year after husband died, she met a man who she had a relationship with for over ten years. He died in September 08, 2017 after long illness.   Live alone.    Has two children, grown sons.    Gets out with neices, enjoys going out to eat, going to [employer].   Goes to church.    Eats all food groups, eats some meat.       Social Drivers of Corporate investment banker Strain: Low Risk  (12/22/2023)   Overall Financial Resource Strain (CARDIA)    Difficulty of Paying Living Expenses: Not hard at all  Food Insecurity: No Food Insecurity (12/22/2023)   Hunger Vital Sign    Worried About Running Out of Food in the Last Year: Never true    Ran Out of Food in the Last Year: Never true  Transportation Needs: No Transportation Needs (12/22/2023)   PRAPARE - Administrator, Civil Service (Medical): No    Lack of Transportation (Non-Medical): No  Physical Activity: Inactive (12/22/2023)   Exercise Vital  Sign    Days of Exercise per Week: 0 days    Minutes of Exercise per Session: 0 min  Stress: No Stress Concern Present (12/22/2023)   [employer] of Occupational Health - Occupational Stress Questionnaire    Feeling of Stress : Not at all  Social Connections: Moderately Isolated (12/22/2023)   Social Connection and Isolation Panel [NHANES]    Frequency of Communication with Friends and Family: More than three times a week    Frequency of Social Gatherings with Friends and Family: More than three times a week    Attends Religious Services: More than 4 times per year    Active Member of [employer] or Organizations: No    Attends Banker Meetings: Never    Marital Status: Widowed    Tobacco Counseling Counseling given: Yes    Clinical Intake:  Pre-visit preparation completed: Yes  Pain : 0-10 Pain Score: 5  Pain Type: Chronic pain Pain Location: Ankle Pain Orientation: Right Pain Descriptors / Indicators: Shooting, Sharp Pain Onset: More than a month ago Pain Frequency: Intermittent     BMI - recorded: 31.71 Nutritional Status: BMI > 30  Obese Nutritional Risks: None Diabetes: No  Lab Results  Component Value Date   HGBA1C 5.7 (H) 07/15/2023   HGBA1C 5.5 07/15/2022     How often do you need to have someone help you when you read instructions, pamphlets, or other written materials from your doctor or pharmacy?: 1 - Never  Interpreter Needed?: No  Information entered by :: Sally Crazier CMA   Activities of Daily Living     12/22/2023    9:27 AM  In your present state of health, do you have any difficulty performing the following activities:  Hearing? 0  Vision? 0  Difficulty concentrating or making decisions? 0  Walking or climbing stairs? 0  Dressing or bathing? 0  Doing errands, shopping? 0  Preparing Food  and eating ? N  Using the Toilet? N  In the past six months, have you accidently leaked urine? N  Do you have problems with loss of bowel control?  N  Managing your Medications? N  Managing your Finances? N  Housekeeping or managing your Housekeeping? N    Patient Care Team: Meldon Sport, MD as PCP - General (Internal Medicine) Riley Cheadle Windsor Hatcher, MD as Consulting Physician (Gastroenterology)  I have updated your Care Teams any recent Medical Services you may have received from other providers in the past year.     Assessment:    This is a routine wellness examination for [place].  Hearing/Vision screen Hearing Screening - Comments:: Patient denies any hearing difficulties.   Vision Screening - Comments:: Wears rx glasses - up to date with routine eye exams with  Row[facility] Copas   Goals Addressed             This Visit's Progress    Exercise 3x per week (30 min per time)   On track    Obese, recommend starting a routine exercise program at least 3 days a week for 30-45 minutes at a time as tolerated.       Patient Stated   On track    Keep weight down.     Patient Stated   On track    Patient states that her goal is to stay busy and active.       Depression Screen     12/22/2023    9:04 AM 11/23/2023    8:05 AM 07/23/2023    8:06 AM 03/19/2023    8:10 AM 01/11/2023    9:06 AM 11/20/2022    8:30 AM 08/12/2022   10:09 AM  PHQ 2/9 Scores  PHQ - 2 Score 0 4 0 0 1 1 1   PHQ- 9 Score 0 8 0 4 3  3     Fall Risk     11/23/2023    8:05 AM 07/23/2023    8:06 AM 03/19/2023    8:10 AM 11/20/2022    8:30 AM 08/12/2022   10:09 AM  Fall Risk   Falls in the past year? 0 0 1 1 0  Number falls in past yr: 0 0 0 0 0  Injury with Fall? 0 0 0 0 0  Risk for fall due to : No Fall Risks No Fall Risks History of fall(s)    Follow up Falls evaluation completed Falls evaluation completed Falls evaluation completed      MEDICARE RISK AT HOME:  Medicare Risk at Home Any stairs in or around the home?: Yes If so, are there any without handrails?: No Home free of loose throw rugs in walkways, pet beds, electrical cords, etc?:  Yes Adequate lighting in your home to reduce risk of falls?: Yes Life alert?: No Use of a cane, walker or w/c?: No Grab bars in the bathroom?: Yes Shower chair or bench in shower?: Yes Elevated toilet seat or a handicapped toilet?: Yes  TIMED UP AND GO:  Was the test performed?  No  Cognitive Function: 6CIT completed    05/21/2021   10:12 AM 11/09/2016    3:17 PM  MMSE - Mini Mental State Exam  Not completed: Unable to complete   Orientation to time  5  Orientation to Place  5  Registration  3  Attention/ Calculation  5  Recall  3  Language- name 2 objects  2  Language- repeat  1  Language- follow 3 step command  3  Language- read & follow direction  1  Write a sentence  1  Copy design  1  Total score  30        12/22/2023    9:20 AM 06/02/2022    9:53 AM 05/21/2021   10:12 AM 11/29/2017    8:27 AM  6CIT Screen  What Year? 0 points 0 points 0 points 0 points  What month? 0 points 0 points 0 points 0 points  What time? 0 points 0 points 0 points 0 points  Count back from 20 0 points 0 points 0 points 0 points  Months in reverse 0 points 0 points 0 points 0 points  Repeat phrase 0 points 2 points 0 points 0 points  Total Score 0 points 2 points 0 points 0 points    Immunizations Immunization History  Administered Date(s) Administered   Fluad Quad(high Dose 65+) 05/21/2020, 04/25/2021, 06/02/2022   Fluad Trivalent(High Dose 65+) 05/03/2023   Influenza, High Dose Seasonal PF 04/20/2016   Influenza,inj,Quad PF,6+ Mos 07/02/2017, 05/04/2019   MODERNA COVID-19 SARS-COV-2 PEDS BIVALENT BOOSTER 44yr-78yr 07/04/2021   Moderna Sars-Covid-2 Vaccination 09/13/2019, 10/11/2019, 05/14/2020   Pneumococcal Conjugate-13 09/28/2016   Pneumococcal Polysaccharide-23 10/01/2017   Tdap 12/30/2017    Screening Tests Health Maintenance  Topic Date Due   Zoster Vaccines- Shingrix (1 of 2) Never done   DEXA SCAN  01/13/2017   MAMMOGRAM  01/22/2023   COVID-19 Vaccine (5 - 2024-25  season) 03/21/2023   INFLUENZA VACCINE  02/18/2024   Medicare Annual Wellness (AWV)  12/21/2024   DTaP/Tdap/Td (2 - Td or Tdap) 12/31/2027   Pneumonia Vaccine 57+ Years old  Completed   HPV VACCINES  Aged Out   Meningococcal B Vaccine  Aged Out   Colonoscopy  Discontinued   Hepatitis C Screening  Discontinued    Health Maintenance  Health Maintenance Due  Topic Date Due   Zoster Vaccines- Shingrix (1 of 2) Never done   DEXA SCAN  01/13/2017   MAMMOGRAM  01/22/2023   COVID-19 Vaccine (5 - 2024-25 season) 03/21/2023   Health Maintenance Items Addressed: Mammogram scheduled, DEXA scheduled  Additional Screening:  Vision Screening: Recommended annual ophthalmology exams for early detection of glaucoma and other disorders of the eye. Would you like a referral to an eye doctor? No    Dental Screening: Recommended annual dental exams for proper oral hygiene  Community Resource Referral / Chronic Care Management: CRR required this visit?  No   CCM required this visit?  No   Plan:    I have personally reviewed and noted the following in the patient's chart:   Medical and social history Use of alcohol , tobacco or illicit drugs  Current medications and supplements including opioid prescriptions. Patient is not currently taking opioid prescriptions. Functional ability and status Nutritional status Physical activity Advanced directives List of other physicians Hospitalizations, surgeries, and ER visits in previous 12 months Vitals Screenings to include cognitive, depression, and falls Referrals and appointments  In addition, I have reviewed and discussed with patient certain preventive protocols, quality metrics, and best practice recommendations. A written personalized care plan for preventive services as well as general preventive health recommendations were provided to patient.   Eligha Kmetz, CMA   12/22/2023   After Visit Summary: (MyChart) Due to this being a  telephonic visit, the after visit summary with patients personalized plan was offered to patient via MyChart   Notes: Nothing significant to report at this  time.

## 2023-12-30 ENCOUNTER — Ambulatory Visit (HOSPITAL_COMMUNITY)
Admission: RE | Admit: 2023-12-30 | Discharge: 2023-12-30 | Disposition: A | Discharge status: Home / Self Care | Source: Ambulatory Visit | Attending: Family Medicine | Admitting: Family Medicine

## 2023-12-30 ENCOUNTER — Encounter (HOSPITAL_COMMUNITY): Payer: Self-pay

## 2023-12-30 DIAGNOSIS — Z1231 Encounter for screening mammogram for malignant neoplasm of breast: Secondary | ICD-10-CM | POA: Insufficient documentation

## 2023-12-30 DIAGNOSIS — M85852 Other specified disorders of bone density and structure, left thigh: Secondary | ICD-10-CM | POA: Diagnosis not present

## 2023-12-30 DIAGNOSIS — Z780 Asymptomatic menopausal state: Secondary | ICD-10-CM | POA: Diagnosis not present

## 2024-01-03 ENCOUNTER — Other Ambulatory Visit (HOSPITAL_COMMUNITY): Payer: Self-pay | Admitting: Family Medicine

## 2024-01-03 ENCOUNTER — Ambulatory Visit: Payer: Self-pay | Admitting: Family Medicine

## 2024-01-03 DIAGNOSIS — R928 Other abnormal and inconclusive findings on diagnostic imaging of breast: Secondary | ICD-10-CM

## 2024-01-20 ENCOUNTER — Ambulatory Visit (HOSPITAL_COMMUNITY)
Admission: RE | Admit: 2024-01-20 | Discharge: 2024-01-20 | Disposition: A | Discharge status: Home / Self Care | Source: Ambulatory Visit | Attending: Internal Medicine | Admitting: Internal Medicine

## 2024-01-20 DIAGNOSIS — N6002 Solitary cyst of left breast: Secondary | ICD-10-CM | POA: Diagnosis not present

## 2024-01-20 DIAGNOSIS — R92322 Mammographic fibroglandular density, left breast: Secondary | ICD-10-CM | POA: Diagnosis not present

## 2024-01-20 DIAGNOSIS — R928 Other abnormal and inconclusive findings on diagnostic imaging of breast: Secondary | ICD-10-CM

## 2024-01-20 DIAGNOSIS — N6324 Unspecified lump in the left breast, lower inner quadrant: Secondary | ICD-10-CM | POA: Diagnosis not present

## 2024-01-26 ENCOUNTER — Other Ambulatory Visit: Payer: Self-pay | Admitting: Internal Medicine

## 2024-01-26 DIAGNOSIS — E039 Hypothyroidism, unspecified: Secondary | ICD-10-CM

## 2024-03-06 DIAGNOSIS — H04123 Dry eye syndrome of bilateral lacrimal glands: Secondary | ICD-10-CM | POA: Diagnosis not present

## 2024-03-21 DIAGNOSIS — N1831 Chronic kidney disease, stage 3a: Secondary | ICD-10-CM | POA: Diagnosis not present

## 2024-03-21 DIAGNOSIS — E039 Hypothyroidism, unspecified: Secondary | ICD-10-CM | POA: Diagnosis not present

## 2024-03-22 LAB — CBC WITH DIFFERENTIAL/PLATELET
Basophils Absolute: 0 x10E3/uL (ref 0.0–0.2)
Basos: 1 %
EOS (ABSOLUTE): 0.1 x10E3/uL (ref 0.0–0.4)
Eos: 2 %
Hematocrit: 42.4 % (ref 34.0–46.6)
Hemoglobin: 13.8 g/dL (ref 11.1–15.9)
Immature Grans (Abs): 0 x10E3/uL (ref 0.0–0.1)
Immature Granulocytes: 0 %
Lymphocytes Absolute: 2.8 x10E3/uL (ref 0.7–3.1)
Lymphs: 43 %
MCH: 28.9 pg (ref 26.6–33.0)
MCHC: 32.5 g/dL (ref 31.5–35.7)
MCV: 89 fL (ref 79–97)
Monocytes Absolute: 0.5 x10E3/uL (ref 0.1–0.9)
Monocytes: 8 %
Neutrophils Absolute: 3 x10E3/uL (ref 1.4–7.0)
Neutrophils: 46 %
Platelets: 197 x10E3/uL (ref 150–450)
RBC: 4.78 x10E6/uL (ref 3.77–5.28)
RDW: 13.8 % (ref 11.7–15.4)
WBC: 6.4 x10E3/uL (ref 3.4–10.8)

## 2024-03-22 LAB — CMP14+EGFR
ALT: 11 IU/L (ref 0–32)
AST: 18 IU/L (ref 0–40)
Albumin: 4.1 g/dL (ref 3.8–4.8)
Alkaline Phosphatase: 83 IU/L (ref 44–121)
BUN/Creatinine Ratio: 20 (ref 12–28)
BUN: 20 mg/dL (ref 8–27)
Bilirubin Total: 0.4 mg/dL (ref 0.0–1.2)
CO2: 22 mmol/L (ref 20–29)
Calcium: 9.7 mg/dL (ref 8.7–10.3)
Chloride: 105 mmol/L (ref 96–106)
Creatinine, Ser: 0.98 mg/dL (ref 0.57–1.00)
Globulin, Total: 2.4 g/dL (ref 1.5–4.5)
Glucose: 96 mg/dL (ref 70–99)
Potassium: 5 mmol/L (ref 3.5–5.2)
Sodium: 143 mmol/L (ref 134–144)
Total Protein: 6.5 g/dL (ref 6.0–8.5)
eGFR: 58 mL/min/1.73 — ABNORMAL LOW (ref >59)

## 2024-03-22 LAB — TSH+FREE T4
Free T4: 1.59 ng/dL (ref 0.82–1.77)
TSH: 1.26 u[IU]/mL (ref 0.450–4.500)

## 2024-03-27 ENCOUNTER — Ambulatory Visit (INDEPENDENT_AMBULATORY_CARE_PROVIDER_SITE_OTHER): Admitting: Internal Medicine

## 2024-03-27 ENCOUNTER — Encounter: Payer: Self-pay | Admitting: Internal Medicine

## 2024-03-27 VITALS — BP 120/60 | HR 64 | Ht 63.0 in | Wt 182.4 lb

## 2024-03-27 DIAGNOSIS — N1831 Chronic kidney disease, stage 3a: Secondary | ICD-10-CM | POA: Diagnosis not present

## 2024-03-27 DIAGNOSIS — R739 Hyperglycemia, unspecified: Secondary | ICD-10-CM | POA: Diagnosis not present

## 2024-03-27 DIAGNOSIS — E039 Hypothyroidism, unspecified: Secondary | ICD-10-CM | POA: Diagnosis not present

## 2024-03-27 DIAGNOSIS — E782 Mixed hyperlipidemia: Secondary | ICD-10-CM | POA: Diagnosis not present

## 2024-03-27 DIAGNOSIS — I10 Essential (primary) hypertension: Secondary | ICD-10-CM

## 2024-03-27 NOTE — Assessment & Plan Note (Signed)
 Lab Results  Component Value Date   TSH 1.260 03/21/2024   On levothyroxine  100 mcg QD Checked TSH and free T4

## 2024-03-27 NOTE — Assessment & Plan Note (Addendum)
 Likely due to inadequate fluid intake and/or age related decline Has h/o horseshoe kidney On ACEi Avoid nephrotoxic agents Advised to increase fluid intake Checked BMP, has shown hyperkalemia in the past - likely due to potassium rich food in addition to lisinopril , advised to avoid potassium rich food in excess Check urine protein/creatinine ratio

## 2024-03-27 NOTE — Assessment & Plan Note (Signed)
 BP Readings from Last 1 Encounters:  03/27/24 120/60   Well-controlled with lisinopril  5 mg QD Counseled for compliance with the medications Advised DASH diet and moderate exercise/walking as tolerated

## 2024-03-27 NOTE — Progress Notes (Signed)
 Established Patient Office Visit  Subjective:  Patient ID: [name], female    DOB: Nov 29, 1940  Age: 83 y.o. MRN: [id number]  CC:  Chief Complaint  Patient presents with   Hypothyroidism    4 month f/u    Chronic Kidney Disease    4 month f/u     HPI [name] is a 83 y.o. female with past medical history of HTN, hypothyroidism and vitamin D  deficiency who presents for annual physical.  HTN: Her BP is well-controlled currently. She denies any headache, chest pain or palpitations.  She denies dizziness since decreasing dose of lisinopril .   Hypothyroidism: She has been taking levothyroxine  100 mcg.  Her TSH is wnl now. She does report fatigue and feeling sluggish in the morning since switching from NP thyroid .  She used to have oversupplemented thyroid  profile in the past.  Of note, her jitteriness/anxiety and palpitations have resolved now since switching from NP thyroid .  Her BMP showed GFR of 58, stable compared to prior.  She denies any dysuria, hematuria, urinary hesitance or resistance.  She is trying to improve fluid intake.  She reports bilateral ankle swelling, but has no pitting edema today.  She has dyspnea on heavy exertion, but denies any orthopnea or PND currently.  She reports feeling weak, but attributes it to her hip pain.  She has noticed improvement with physical therapy.  She was given Lasix  for leg swelling, which she has taken very rarely.  She reports intermittent numbness of the left foot and leg, which usually improves with magnesium supplement (250 mg QD).  She has history of DDD of lumbar spine and has intermittent flareup of sciatica.  Denies any recent injury or fall.  Past Medical History:  Diagnosis Date   Allergy    Anemia    prior to hysterectomy   Back pain    comepensating for knee pain per pt   Blood transfusion without reported diagnosis    Cancer (HCC)    Cataract    right eye and immature   Complication of anesthesia     Constipation    takes Colace daily and Milk of Mag   CTS (carpal tunnel syndrome) 08/31/2016   Dry eyes    uses Eye Drops daily as needed   Female hypogonadism syndrome    GERD (gastroesophageal reflux disease)    History of colon polyps    Hyperlipidemia    was on meds but stopped taking back in June 2015   Hypertension    takes Lisinopril  daily   Hypothyroidism    takes Synthroid  daily   Obesity (BMI 30.0-34.9)    Peripheral arterial disease (HCC)    Primary localized osteoarthritis of left knee 04/17/2015   Primary localized osteoarthritis of right knee    knees   Stage 3a chronic kidney disease (HCC) 10/14/2021    Past Surgical History:  Procedure Laterality Date   ABDOMINAL HYSTERECTOMY  90's   complete   APPENDECTOMY  80's   BACK SURGERY     CATARACT EXTRACTION W/PHACO Right 12/23/2015   Procedure: CATARACT EXTRACTION PHACO AND INTRAOCULAR LENS PLACEMENT RIGHT EYE CDE=6.42;  Surgeon: Dow JULIANNA Burke, MD;  Location: [facility];  Service: Ophthalmology;  Laterality: Right;   CATARACT EXTRACTION W/PHACO Left 05/11/2022   Procedure: CATARACT EXTRACTION PHACO AND INTRAOCULAR LENS PLACEMENT (IOC);  Surgeon: Harrie Agent, MD;  Location: [facility];  Service: Ophthalmology;  Laterality: Left;  CDE 10.71   COLONOSCOPY  09/09/2004   MFM:Wnmfjo rectum  and colon   COLONOSCOPY N/A 11/08/2013   Procedure: COLONOSCOPY;  Surgeon: Lamar CHRISTELLA Hollingshead, MD;  Location: [facility];  Service: Endoscopy;  Laterality: N/A;  10:45   COLONOSCOPY WITH PROPOFOL  N/A 06/30/2021   Surgeon: Hollingshead Lamar CHRISTELLA, MD; diverticulosis in sigmoid, descending, transverse colon, nonbleeding internal hemorrhoids, otherwise normal exam.  No repeat due to age.   JOINT REPLACEMENT     2015, 2016   NECK SURGERY  1983   RECTOCELE REPAIR  10/2007   SPINE SURGERY     ruptured disc   TOTAL KNEE ARTHROPLASTY Right 04/04/2014   DR JANE   TOTAL KNEE ARTHROPLASTY Right 04/04/2014   Procedure: RIGHT TOTAL KNEE ARTHROPLASTY;   Surgeon: Lamar DELENA JANE, MD;  Location: [facility] OR;  Service: Orthopedics;  Laterality: Right;   TOTAL KNEE ARTHROPLASTY Left 04/29/2015   Procedure: TOTAL KNEE ARTHROPLASTY;  Surgeon: Lamar JANE, MD;  Location: [facility] OR;  Service: Orthopedics;  Laterality: Left;   TUBAL LIGATION      Family History  Problem Relation Age of Onset   Colon cancer Sister        age 21s   Myasthenia gravis Sister    Bladder Cancer Brother    Lung cancer Brother        age 92   Alzheimer's disease Mother    COPD Father    Asthma Father    Alcohol  abuse Brother        likely died of pneumonia    Social History   Socioeconomic History   Marital status: Widowed    Spouse name: Not on file   Number of children: 2   Years of education: 15   Highest education level: Not on file  Occupational History   Occupation: retired    Comment: Hotel manager  Tobacco Use   Smoking status: Never   Smokeless tobacco: Never  Vaping Use   Vaping status: Never Used  Substance and Sexual Activity   Alcohol  use: No   Drug use: No   Sexual activity: Never    Birth control/protection: Surgical  Other Topics Concern   Not on file  Social History Narrative   Husband died after over 40 years and then a year after husband died, she met a man who she had a relationship with for over ten years. He died in 2017-10-05 after long illness.   Live alone.    Has two children, grown sons.    Gets out with neices, enjoys going out to eat, going to [employer].   Goes to church.    Eats all food groups, eats some meat.       Social Drivers of Corporate investment banker Strain: Low Risk  (12/22/2023)   Overall Financial Resource Strain (CARDIA)    Difficulty of Paying Living Expenses: Not hard at all  Food Insecurity: No Food Insecurity (12/22/2023)   Hunger Vital Sign    Worried About Running Out of Food in the Last Year: Never true    Ran Out of Food in the Last Year: Never true  Transportation Needs: No Transportation  Needs (12/22/2023)   PRAPARE - Administrator, Civil Service (Medical): No    Lack of Transportation (Non-Medical): No  Physical Activity: Inactive (12/22/2023)   Exercise Vital Sign    Days of Exercise per Week: 0 days    Minutes of Exercise per Session: 0 min  Stress: No Stress Concern Present (12/22/2023)   [employer] of Occupational Health -  Occupational Stress Questionnaire    Feeling of Stress : Not at all  Social Connections: Moderately Isolated (12/22/2023)   Social Connection and Isolation Panel    Frequency of Communication with Friends and Family: More than three times a week    Frequency of Social Gatherings with Friends and Family: More than three times a week    Attends Religious Services: More than 4 times per year    Active Member of [employer] or Organizations: No    Attends Banker Meetings: Never    Marital Status: Widowed  Intimate Partner Violence: Not At Risk (12/22/2023)   Humiliation, Afraid, Rape, and Kick questionnaire    Fear of Current or Ex-Partner: No    Emotionally Abused: No    Physically Abused: No    Sexually Abused: No    Outpatient Medications Prior to Visit  Medication Sig Dispense Refill   aspirin  EC 81 MG tablet Take 81 mg by mouth daily. 2 x a week     b complex vitamins capsule Take 1 capsule by mouth daily.     Cholecalciferol (VITAMIN D ) 50 MCG (2000 UT) tablet Take 2,000 Units by mouth daily.     furosemide  (LASIX ) 20 MG tablet Take 1 tablet (20 mg total) by mouth once as needed for up to 1 dose. 30 tablet 3   levothyroxine  (SYNTHROID ) 100 MCG tablet TAKE ONE TABLET (100MCG TOTAL) BY MOUTH DAILY BEFORE BREAKFAST 90 tablet 1   lisinopril  (ZESTRIL ) 5 MG tablet Take 1 tablet (5 mg total) by mouth daily. 90 tablet 3   polyethylene glycol powder (GLYCOLAX /MIRALAX ) 17 GM/SCOOP powder Take 1 Container by mouth once. As needed     No facility-administered medications prior to visit.    Allergies  Allergen Reactions    Codeine Nausea And Vomiting   Crestor  [Rosuvastatin ] Other (See Comments)    Body aches   Flexall [Menthol (Topical Analgesic)] Rash   Penicillins Rash    ROS Review of Systems  Constitutional:  Positive for fatigue. Negative for chills and fever.  HENT:  Negative for congestion, sinus pressure, sinus pain and sore throat.   Eyes:  Negative for pain and discharge.  Respiratory:  Negative for cough and shortness of breath.   Cardiovascular:  Positive for leg swelling. Negative for chest pain and palpitations.  Gastrointestinal:  Negative for abdominal pain, diarrhea, nausea and vomiting.  Endocrine: Negative for polydipsia and polyuria.  Genitourinary:  Negative for dysuria and hematuria.  Musculoskeletal:  Positive for arthralgias and back pain. Negative for neck pain and neck stiffness.  Skin:  Negative for rash.  Neurological:  Positive for weakness. Negative for dizziness.  Psychiatric/Behavioral:  Negative for agitation and behavioral problems. The patient is not nervous/anxious.       Objective:    Physical Exam Vitals reviewed.  Constitutional:      General: She is not in acute distress.    Appearance: She is not diaphoretic.  HENT:     Head: Normocephalic and atraumatic.     Nose: Nose normal.     Mouth/Throat:     Mouth: Mucous membranes are moist.  Eyes:     General: No scleral icterus.    Extraocular Movements: Extraocular movements intact.  Cardiovascular:     Rate and Rhythm: Normal rate and regular rhythm.     Heart sounds: Normal heart sounds. No murmur heard. Pulmonary:     Breath sounds: Normal breath sounds. No wheezing or rales.  Musculoskeletal:     Cervical back:  Neck supple. No tenderness.     Right lower leg: Edema (Mild) present.     Left lower leg: No edema.  Skin:    General: Skin is warm.     Findings: No rash.     Comments: Papule near sole of right foot, about 1 cm in diameter  Neurological:     General: No focal deficit present.      Mental Status: She is alert and oriented to person, place, and time.     Sensory: No sensory deficit.     Motor: No weakness.  Psychiatric:        Mood and Affect: Mood normal.        Behavior: Behavior normal.     BP 120/60 (BP Location: Right Arm)   Pulse 64   Ht 5' 3 (1.6 m)   Wt 182 lb 6.4 oz (82.7 kg)   LMP 07/20/1990 (Approximate)   SpO2 96%   BMI 32.31 kg/m  Wt Readings from Last 3 Encounters:  03/27/24 182 lb 6.4 oz (82.7 kg)  12/22/23 179 lb (81.2 kg)  11/23/23 180 lb 6.4 oz (81.8 kg)    Lab Results  Component Value Date   TSH 1.260 03/21/2024   Lab Results  Component Value Date   WBC 6.4 03/21/2024   HGB 13.8 03/21/2024   HCT 42.4 03/21/2024   MCV 89 03/21/2024   PLT 197 03/21/2024   Lab Results  Component Value Date   NA 143 03/21/2024   K 5.0 03/21/2024   CO2 22 03/21/2024   GLUCOSE 96 03/21/2024   BUN 20 03/21/2024   CREATININE 0.98 03/21/2024   BILITOT 0.4 03/21/2024   ALKPHOS 83 03/21/2024   AST 18 03/21/2024   ALT 11 03/21/2024   PROT 6.5 03/21/2024   ALBUMIN 4.1 03/21/2024   CALCIUM  9.7 03/21/2024   ANIONGAP 13 05/01/2015   EGFR 58 (L) 03/21/2024   Lab Results  Component Value Date   CHOL 216 (H) 07/15/2023   Lab Results  Component Value Date   HDL 38 (L) 07/15/2023   Lab Results  Component Value Date   LDLCALC 142 (H) 07/15/2023   Lab Results  Component Value Date   TRIG 197 (H) 07/15/2023   Lab Results  Component Value Date   CHOLHDL 5.7 (H) 07/15/2023   Lab Results  Component Value Date   HGBA1C 5.7 (H) 07/15/2023      Assessment & Plan:   Problem List Items Addressed This Visit       Cardiovascular and Mediastinum   Hypertension - Primary   BP Readings from Last 1 Encounters:  03/27/24 120/60   Well-controlled with lisinopril  5 mg QD Counseled for compliance with the medications Advised DASH diet and moderate exercise/walking as tolerated      Relevant Orders   CMP14+EGFR   CBC with  Differential/Platelet   TSH + free T4     Endocrine   Hypothyroidism   Lab Results  Component Value Date   TSH 1.260 03/21/2024   On levothyroxine  100 mcg QD Checked TSH and free T4        Genitourinary   Stage 3a chronic kidney disease (HCC)   Likely due to inadequate fluid intake and/or age related decline Has h/o horseshoe kidney On ACEi Avoid nephrotoxic agents Advised to increase fluid intake Checked BMP, has shown hyperkalemia in the past - likely due to potassium rich food in addition to lisinopril , advised to avoid potassium rich food in excess  Relevant Orders   VITAMIN D  25 Hydroxy (Vit-D Deficiency, Fractures)   CMP14+EGFR   CBC with Differential/Platelet     Other   Hyperlipidemia   Lipid profile reviewed No history of CVA, CAD or DM Advised to follow low cholesterol diet for now      Relevant Orders   Lipid panel   Other Visit Diagnoses       Hyperglycemia       Relevant Orders   Hemoglobin A1c         No orders of the defined types were placed in this encounter.   Follow-up: Return in about 5 months (around 08/27/2024) for Annual physical.    Hannah MARLA Blanch, MD

## 2024-03-27 NOTE — Assessment & Plan Note (Signed)
 Lipid profile reviewed No history of CVA, CAD or DM Advised to follow low cholesterol diet for now

## 2024-03-27 NOTE — Patient Instructions (Signed)
Please continue to take medications as prescribed. ° °Please continue to follow low salt diet and ambulate as tolerated. ° °Please get fasting blood tests done before the next visit. °

## 2024-04-26 ENCOUNTER — Ambulatory Visit (INDEPENDENT_AMBULATORY_CARE_PROVIDER_SITE_OTHER)

## 2024-04-26 DIAGNOSIS — Z23 Encounter for immunization: Secondary | ICD-10-CM

## 2024-06-03 ENCOUNTER — Ambulatory Visit
Admission: EM | Admit: 2024-06-03 | Discharge: 2024-06-03 | Disposition: A | Discharge status: Home / Self Care | Attending: Family Medicine | Admitting: Family Medicine

## 2024-06-03 DIAGNOSIS — W5501XA Bitten by cat, initial encounter: Secondary | ICD-10-CM | POA: Diagnosis not present

## 2024-06-03 DIAGNOSIS — L03113 Cellulitis of right upper limb: Secondary | ICD-10-CM

## 2024-06-03 DIAGNOSIS — Z23 Encounter for immunization: Secondary | ICD-10-CM

## 2024-06-03 MED ORDER — TETANUS-DIPHTH-ACELL PERTUSSIS 5-2-15.5 LF-MCG/0.5 IM SUSP
0.5000 mL | Freq: Once | INTRAMUSCULAR | Status: AC
  Administered 2024-06-03: 0.5 mL via INTRAMUSCULAR

## 2024-06-03 MED ORDER — BACITRACIN 500 UNIT/GM EX OINT
1.0000 | TOPICAL_OINTMENT | Freq: Once | CUTANEOUS | Status: AC
  Administered 2024-06-03: 1 via TOPICAL

## 2024-06-03 MED ORDER — CHLORHEXIDINE GLUCONATE 4 % EX SOLN: Freq: Every day | CUTANEOUS | 0 refills | Status: AC | PRN

## 2024-06-03 MED ORDER — DOXYCYCLINE HYCLATE 100 MG PO CAPS: 100.0000 mg | ORAL_CAPSULE | Freq: Two times a day (BID) | ORAL | 0 refills | Status: AC

## 2024-06-03 MED ORDER — MUPIROCIN 2 % EX OINT: 1.0000 | TOPICAL_OINTMENT | Freq: Two times a day (BID) | CUTANEOUS | 0 refills | Status: AC

## 2024-06-03 NOTE — Discharge Instructions (Signed)
 We have updated your tetanus shot today.  As discussed, if you change your mind on the rabies series per the CDC you do have about 72 hours from the time of incident to make that decision so please do return at any point that you change your mind and want to do the series.  We have placed you on some antibiotics for the bite site and recommend that you clean the area at least once a day with a Hibiclens  solution, apply the mupirocin ointment and nonstick dressings with Coban wrap.  Return for worsening or unresolving symptoms

## 2024-06-03 NOTE — ED Notes (Signed)
 Pt reports site cleaned prior by UC staff.  RN applied bacitracin ointment, nonadherent pad, and secured dressing with coban. Pt tolerated well.  Site management and infection prevention education reviewed. Pt verbalized understanding.

## 2024-06-03 NOTE — ED Triage Notes (Signed)
 Pt reports cat bite to the right wrist, swelling and redness to the site. Per pt cat is up to date on shots.

## 2024-06-07 NOTE — ED Provider Notes (Signed)
 [facility]    CSN: [id number] Arrival date & time: 06/03/24  0845      History   Chief Complaint No chief complaint on file.   HPI [name] is a 83 y.o. female.   Presenting today with cat bite to the right wrist that occurred yesterday. Denies fever, chills, decreased ROM, N/V. Does note redness, swelling and pain to the site. States she is unsure about the vaccine status of the cat because it is a neighborhood stray she has been feeding. She notes a neighbor had told her she got the cat spayed so she assumes it is UTD but unclear. Last tdap 2019. So far trying home wound care with minimal relief.     Past Medical History:  Diagnosis Date   Allergy    Anemia    prior to hysterectomy   Back pain    comepensating for knee pain per pt   Blood transfusion without reported diagnosis    Cancer (HCC)    Cataract    right eye and immature   Complication of anesthesia    Constipation    takes Colace daily and Milk of Mag   CTS (carpal tunnel syndrome) 08/31/2016   Dry eyes    uses Eye Drops daily as needed   Female hypogonadism syndrome    GERD (gastroesophageal reflux disease)    History of colon polyps    Hyperlipidemia    was on meds but stopped taking back in June 2015   Hypertension    takes Lisinopril  daily   Hypothyroidism    takes Synthroid  daily   Obesity (BMI 30.0-34.9)    Peripheral arterial disease    Primary localized osteoarthritis of left knee 04/17/2015   Primary localized osteoarthritis of right knee    knees   Stage 3a chronic kidney disease (HCC) 10/14/2021    Patient Active Problem List   Diagnosis Date Noted   Lumbar spondylosis 11/23/2023   Insect bite of right foot 11/23/2023   Atrophic vaginitis 07/23/2023   Primary osteoarthritis involving multiple joints 07/23/2023   Leg swelling 01/11/2023   Right sided temporal headache 11/20/2022   Sciatica of left side 08/12/2022   Palpitations 07/21/2022   Gastroesophageal  reflux disease without esophagitis 02/13/2022   Hemorrhoids 01/07/2022   Stage 3a chronic kidney disease (HCC) 10/14/2021   Encounter for general adult medical examination with abnormal findings 07/16/2021   Bilateral impacted cerumen 04/25/2021   Obesity (BMI 30.0-34.9)    DJD (degenerative joint disease), cervical 10/15/2016   Vitamin D  deficiency 09/28/2016   Knee joint replacement status, bilateral 08/31/2016   Chronic constipation 08/31/2016   CTS (carpal tunnel syndrome) 08/31/2016   Hyperlipidemia 04/19/2015   Peripheral arterial disease (HCC) 04/19/2015   DJD (degenerative joint disease) of knee 04/04/2014   Hypertension    Hypothyroidism    Recurrent urinary tract infection    Rectal bleeding 10/31/2013   FH: colon cancer 10/31/2013    Past Surgical History:  Procedure Laterality Date   ABDOMINAL HYSTERECTOMY  90's   complete   APPENDECTOMY  80's   BACK SURGERY     CATARACT EXTRACTION W/PHACO Right 12/23/2015   Procedure: CATARACT EXTRACTION PHACO AND INTRAOCULAR LENS PLACEMENT RIGHT EYE CDE=6.42;  Surgeon: Dow JULIANNA Burke, MD;  Location: [facility];  Service: Ophthalmology;  Laterality: Right;   CATARACT EXTRACTION W/PHACO Left 05/11/2022   Procedure: CATARACT EXTRACTION PHACO AND INTRAOCULAR LENS PLACEMENT (IOC);  Surgeon: Harrie Agent, MD;  Location: [facility];  Service: Ophthalmology;  Laterality: Left;  CDE 10.71   COLONOSCOPY  09/09/2004   MFM:Wnmfjo rectum and colon   COLONOSCOPY N/A 11/08/2013   Procedure: COLONOSCOPY;  Surgeon: Lamar CHRISTELLA Hollingshead, MD;  Location: [facility];  Service: Endoscopy;  Laterality: N/A;  10:45   COLONOSCOPY WITH PROPOFOL  N/A 06/30/2021   Surgeon: Hollingshead Lamar CHRISTELLA, MD; diverticulosis in sigmoid, descending, transverse colon, nonbleeding internal hemorrhoids, otherwise normal exam.  No repeat due to age.   JOINT REPLACEMENT     2015, 2016   NECK SURGERY  1983   RECTOCELE REPAIR  10/2007   SPINE SURGERY     ruptured disc   TOTAL KNEE  ARTHROPLASTY Right 04/04/2014   DR JANE   TOTAL KNEE ARTHROPLASTY Right 04/04/2014   Procedure: RIGHT TOTAL KNEE ARTHROPLASTY;  Surgeon: Lamar DELENA Jane, MD;  Location: [facility] OR;  Service: Orthopedics;  Laterality: Right;   TOTAL KNEE ARTHROPLASTY Left 04/29/2015   Procedure: TOTAL KNEE ARTHROPLASTY;  Surgeon: Lamar Jane, MD;  Location: [facility] OR;  Service: Orthopedics;  Laterality: Left;   TUBAL LIGATION      OB History   No obstetric history on file.      Home Medications    Prior to Admission medications   Medication Sig Start Date End Date Taking? Authorizing Provider  chlorhexidine  (HIBICLENS ) 4 % external liquid Apply topically daily as needed. 06/03/24  Yes Stuart Vernell Norris, PA-C  doxycycline  (VIBRAMYCIN ) 100 MG capsule Take 1 capsule (100 mg total) by mouth 2 (two) times daily. 06/03/24  Yes Stuart Vernell Norris, PA-C  mupirocin  ointment (BACTROBAN ) 2 % Apply 1 Application topically 2 (two) times daily. 06/03/24  Yes Stuart Vernell Norris, PA-C  aspirin  EC 81 MG tablet Take 81 mg by mouth daily. 2 x a week    [provider]  b complex vitamins capsule Take 1 capsule by mouth daily.    [provider]  Cholecalciferol (VITAMIN D ) 50 MCG (2000 UT) tablet Take 2,000 Units by mouth daily.    [provider]  furosemide  (LASIX ) 20 MG tablet Take 1 tablet (20 mg total) by mouth once as needed for up to 1 dose. 01/11/23   Del Orbe Polanco, Hilario, FNP  levothyroxine  (SYNTHROID ) 100 MCG tablet TAKE ONE TABLET ( TOTAL) BY MOUTH DAILY BEFORE BREAKFAST 01/26/24   Tobie Suzzane POUR, MD  lisinopril  (ZESTRIL ) 5 MG tablet Take 1 tablet (5 mg total) by mouth daily. 07/23/23   Patel, Rutwik K, MD  polyethylene glycol powder (GLYCOLAX /MIRALAX ) 17 GM/SCOOP powder Take 1 Container by mouth once. As needed    [provider]    Family History Family History  Problem Relation Age of Onset   Colon cancer Sister        age 10s   Myasthenia gravis Sister     Bladder Cancer Brother    Lung cancer Brother        age 56   Alzheimer's disease Mother    COPD Father    Asthma Father    Alcohol  abuse Brother        likely died of pneumonia    Social History Social History   Tobacco Use   Smoking status: Never   Smokeless tobacco: Never  Vaping Use   Vaping status: Never Used  Substance Use Topics   Alcohol  use: No   Drug use: No     Allergies   Codeine, Crestor  [rosuvastatin ], Flexall [menthol (topical analgesic)], and Penicillins   Review of Systems Review of Systems PER HPI  Physical Exam Triage Vital Signs ED Triage Vitals  Encounter Vitals Group     BP 06/03/24 0925 (!) 144/80     Girls Systolic BP Percentile --      Girls Diastolic BP Percentile --      Boys Systolic BP Percentile --      Boys Diastolic BP Percentile --      Pulse Rate 06/03/24 0925 (!) 59     Resp 06/03/24 0925 20     Temp 06/03/24 0925 98.2 F (36.8 C)     Temp Source 06/03/24 0925 Oral     SpO2 06/03/24 0925 95 %     Weight --      Height --      Head Circumference --      Peak Flow --      Pain Score 06/03/24 0928 6     Pain Loc --      Pain Education --      Exclude from Growth Chart --    No data found.  Updated Vital Signs BP (!) 144/80 (BP Location: Right Arm)   Pulse (!) 59   Temp 98.2 F (36.8 C) (Oral)   Resp 20   LMP 07/20/1990 (Approximate)   SpO2 95%   Visual Acuity Right Eye Distance:   Left Eye Distance:   Bilateral Distance:    Right Eye Near:   Left Eye Near:    Bilateral Near:     Physical Exam Vitals and nursing note reviewed.  Constitutional:      Appearance: Normal appearance. She is not ill-appearing.  HENT:     Head: Atraumatic.  Eyes:     Extraocular Movements: Extraocular movements intact.     Conjunctiva/sclera: Conjunctivae normal.  Cardiovascular:     Rate and Rhythm: Normal rate.  Pulmonary:     Effort: Pulmonary effort is normal.  Musculoskeletal:        General: Swelling, tenderness  and signs of injury present. Normal range of motion.     Cervical back: Normal range of motion and neck supple.  Skin:    General: Skin is warm and dry.     Comments: Multiple small lacerations to right wrist with surrounding erythema, edema, warmth  Neurological:     Mental Status: She is alert and oriented to person, place, and time.     Comments: RUE neurovascularly intact  Psychiatric:        Mood and Affect: Mood normal.        Thought Content: Thought content normal.        Judgment: Judgment normal.      UC Treatments / Results  Labs (all labs ordered are listed, but only abnormal results are displayed) Labs Reviewed - No data to display  EKG   Radiology No results found.  Procedures Procedures (including critical care time)  Medications Ordered in UC Medications  Tdap (ADACEL) injection 0.5 mL (0.5 mLs Intramuscular Given 06/03/24 1024)  bacitracin ointment 1 Application (1 Application Topical Given 06/03/24 1024)    Initial Impression / Assessment and Plan / UC Course  I have reviewed the triage vital signs and the nursing notes.  Pertinent labs & imaging results that were available during my care of the patient were reviewed by me and considered in my medical decision making (see chart for details).     Area cleaned and dressed, tdap updated, and will treat with doxycycline, hibiclens , mupirocin, good home wound care. Declines rabies series after conversation about risks. Discussed  that if she changes her mind CDC recommendations state she can return within 72 hours to start series if desired. Return for worsening or unresolving sxs.  Final Clinical Impressions(s) / UC Diagnoses   Final diagnoses:  Right arm cellulitis  Cat bite, initial encounter  Need for Tdap vaccination     Discharge Instructions      We have updated your tetanus shot today.  As discussed, if you change your mind on the rabies series per the CDC you do have about 72 hours from  the time of incident to make that decision so please do return at any point that you change your mind and want to do the series.  We have placed you on some antibiotics for the bite site and recommend that you clean the area at least once a day with a Hibiclens  solution, apply the mupirocin ointment and nonstick dressings with Coban wrap.  Return for worsening or unresolving symptoms    ED Prescriptions     Medication Sig Dispense Auth. Provider   doxycycline (VIBRAMYCIN) 100 MG capsule Take 1 capsule (100 mg total) by mouth 2 (two) times daily. 14 capsule Stuart Vernell Norris, PA-C   mupirocin ointment (BACTROBAN) 2 % Apply 1 Application topically 2 (two) times daily. 60 g Stuart Vernell Norris, PA-C   chlorhexidine  (HIBICLENS ) 4 % external liquid Apply topically daily as needed. 236 mL Stuart Vernell Norris, [place]      PDMP not reviewed this encounter.   Stuart Vernell [place], PA-C 06/07/24 [phone]

## 2024-07-25 ENCOUNTER — Other Ambulatory Visit: Payer: Self-pay | Admitting: Internal Medicine

## 2024-07-25 DIAGNOSIS — E039 Hypothyroidism, unspecified: Secondary | ICD-10-CM

## 2024-07-25 DIAGNOSIS — I10 Essential (primary) hypertension: Secondary | ICD-10-CM

## 2024-08-24 LAB — CBC WITH DIFFERENTIAL/PLATELET
Basophils Absolute: 0 10*3/uL (ref 0.0–0.2)
Basos: 1 %
EOS (ABSOLUTE): 0.1 10*3/uL (ref 0.0–0.4)
Eos: 2 %
Hematocrit: 42.8 % (ref 34.0–46.6)
Hemoglobin: 13.9 g/dL (ref 11.1–15.9)
Immature Grans (Abs): 0 10*3/uL (ref 0.0–0.1)
Immature Granulocytes: 0 %
Lymphocytes Absolute: 2.7 10*3/uL (ref 0.7–3.1)
Lymphs: 44 %
MCH: 28.7 pg (ref 26.6–33.0)
MCHC: 32.5 g/dL (ref 31.5–35.7)
MCV: 88 fL (ref 79–97)
Monocytes Absolute: 0.4 10*3/uL (ref 0.1–0.9)
Monocytes: 7 %
Neutrophils Absolute: 2.8 10*3/uL (ref 1.4–7.0)
Neutrophils: 46 %
Platelets: 218 10*3/uL (ref 150–450)
RBC: 4.84 x10E6/uL (ref 3.77–5.28)
RDW: 13.1 % (ref 11.7–15.4)
WBC: 6.1 10*3/uL (ref 3.4–10.8)

## 2024-08-24 LAB — CMP14+EGFR
ALT: 13 [IU]/L (ref 0–32)
AST: 21 [IU]/L (ref 0–40)
Albumin: 4.3 g/dL (ref 3.7–4.7)
Alkaline Phosphatase: 84 [IU]/L (ref 48–129)
BUN/Creatinine Ratio: 19 (ref 12–28)
BUN: 19 mg/dL (ref 8–27)
Bilirubin Total: 0.4 mg/dL (ref 0.0–1.2)
CO2: 21 mmol/L (ref 20–29)
Calcium: 9.3 mg/dL (ref 8.7–10.3)
Chloride: 105 mmol/L (ref 96–106)
Creatinine, Ser: 1.01 mg/dL — ABNORMAL HIGH (ref 0.57–1.00)
Globulin, Total: 2.3 g/dL (ref 1.5–4.5)
Glucose: 94 mg/dL (ref 70–99)
Potassium: 4.7 mmol/L (ref 3.5–5.2)
Sodium: 142 mmol/L (ref 134–144)
Total Protein: 6.6 g/dL (ref 6.0–8.5)
eGFR: 56 mL/min/{1.73_m2} — ABNORMAL LOW

## 2024-08-24 LAB — VITAMIN D 25 HYDROXY (VIT D DEFICIENCY, FRACTURES): Vit D, 25-Hydroxy: 46 ng/mL (ref 30.0–100.0)

## 2024-08-24 LAB — LIPID PANEL
Chol/HDL Ratio: 5.7 ratio — ABNORMAL HIGH (ref 0.0–4.4)
Cholesterol, Total: 218 mg/dL — ABNORMAL HIGH (ref 100–199)
HDL: 38 mg/dL — ABNORMAL LOW
LDL Chol Calc (NIH): 144 mg/dL — ABNORMAL HIGH (ref 0–99)
Triglycerides: 200 mg/dL — ABNORMAL HIGH (ref 0–149)
VLDL Cholesterol Cal: 36 mg/dL (ref 5–40)

## 2024-08-24 LAB — HEMOGLOBIN A1C
Est. average glucose Bld gHb Est-mCnc: 117 mg/dL
Hgb A1c MFr Bld: 5.7 % — ABNORMAL HIGH (ref 4.8–5.6)

## 2024-08-24 LAB — TSH+FREE T4
Free T4: 1.52 ng/dL (ref 0.82–1.77)
TSH: 0.563 u[IU]/mL (ref 0.450–4.500)

## 2024-08-28 ENCOUNTER — Encounter: Payer: Self-pay | Admitting: Internal Medicine

## 2024-12-25 ENCOUNTER — Ambulatory Visit
